# Patient Record
Sex: Female | Born: 1935 | Race: White | Hispanic: No | Smoking: Never smoker
Health system: Southern US, Community
[De-identification: ages and names within clinical notes are randomized; demographics above are authoritative.]

## PROBLEM LIST (undated history)

## (undated) DIAGNOSIS — B54 Unspecified malaria: Secondary | ICD-10-CM

## (undated) DIAGNOSIS — H9190 Unspecified hearing loss, unspecified ear: Secondary | ICD-10-CM

## (undated) DIAGNOSIS — C801 Malignant (primary) neoplasm, unspecified: Secondary | ICD-10-CM

## (undated) DIAGNOSIS — Z9181 History of falling: Secondary | ICD-10-CM

## (undated) DIAGNOSIS — C50919 Malignant neoplasm of unspecified site of unspecified female breast: Secondary | ICD-10-CM

## (undated) DIAGNOSIS — H547 Unspecified visual loss: Secondary | ICD-10-CM

## (undated) DIAGNOSIS — M810 Age-related osteoporosis without current pathological fracture: Secondary | ICD-10-CM

## (undated) DIAGNOSIS — H3530 Unspecified macular degeneration: Secondary | ICD-10-CM

## (undated) DIAGNOSIS — K219 Gastro-esophageal reflux disease without esophagitis: Secondary | ICD-10-CM

## (undated) DIAGNOSIS — H5316 Psychophysical visual disturbances: Secondary | ICD-10-CM

## (undated) HISTORY — DX: Unspecified visual loss: H54.7

## (undated) HISTORY — DX: History of falling: Z91.81

## (undated) HISTORY — PX: BREAST SURGERY: SHX581

## (undated) HISTORY — PX: BREAST LUMPECTOMY: SHX2

---
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
## 2005-02-11 ENCOUNTER — Ambulatory Visit: Payer: Self-pay | Admitting: Family Medicine

## 2010-12-16 ENCOUNTER — Ambulatory Visit: Payer: Self-pay | Admitting: Family Medicine

## 2010-12-21 ENCOUNTER — Ambulatory Visit: Payer: Self-pay | Admitting: Family Medicine

## 2011-06-28 ENCOUNTER — Ambulatory Visit: Payer: Self-pay | Admitting: Surgery

## 2011-12-26 ENCOUNTER — Ambulatory Visit: Payer: Self-pay | Admitting: Surgery

## 2012-01-06 ENCOUNTER — Ambulatory Visit: Payer: Self-pay | Admitting: Surgery

## 2012-01-12 ENCOUNTER — Ambulatory Visit: Payer: Self-pay | Admitting: Surgery

## 2012-01-23 LAB — PATHOLOGY REPORT

## 2012-02-02 ENCOUNTER — Ambulatory Visit: Payer: Self-pay | Admitting: Oncology

## 2012-02-22 ENCOUNTER — Ambulatory Visit: Payer: Self-pay | Admitting: Oncology

## 2014-08-21 ENCOUNTER — Emergency Department: Payer: Self-pay | Admitting: Emergency Medicine

## 2014-08-21 LAB — CBC WITH DIFFERENTIAL/PLATELET
Basophil #: 0.1 10*3/uL (ref 0.0–0.1)
Basophil %: 0.9 %
EOS ABS: 0.1 10*3/uL (ref 0.0–0.7)
Eosinophil %: 1.4 %
HCT: 32.3 % — AB (ref 35.0–47.0)
HGB: 10.9 g/dL — AB (ref 12.0–16.0)
Lymphocyte #: 1.5 10*3/uL (ref 1.0–3.6)
Lymphocyte %: 25.4 %
MCH: 31 pg (ref 26.0–34.0)
MCHC: 33.9 g/dL (ref 32.0–36.0)
MCV: 92 fL (ref 80–100)
Monocyte #: 0.5 x10 3/mm (ref 0.2–0.9)
Monocyte %: 8.2 %
Neutrophil #: 3.8 10*3/uL (ref 1.4–6.5)
Neutrophil %: 64.1 %
Platelet: 183 10*3/uL (ref 150–440)
RBC: 3.53 10*6/uL — ABNORMAL LOW (ref 3.80–5.20)
RDW: 12.8 % (ref 11.5–14.5)
WBC: 5.9 10*3/uL (ref 3.6–11.0)

## 2014-08-21 LAB — URINALYSIS, COMPLETE
BACTERIA: NONE SEEN
Bilirubin,UR: NEGATIVE
GLUCOSE, UR: NEGATIVE mg/dL (ref 0–75)
KETONE: NEGATIVE
NITRITE: NEGATIVE
Ph: 7 (ref 4.5–8.0)
Protein: NEGATIVE
RBC,UR: 1 /HPF (ref 0–5)
Specific Gravity: 1.006 (ref 1.003–1.030)
Squamous Epithelial: NONE SEEN

## 2014-08-21 LAB — COMPREHENSIVE METABOLIC PANEL
AST: 24 U/L (ref 15–37)
Albumin: 3.6 g/dL (ref 3.4–5.0)
Alkaline Phosphatase: 101 U/L
Anion Gap: 10 (ref 7–16)
BUN: 15 mg/dL (ref 7–18)
Bilirubin,Total: 0.4 mg/dL (ref 0.2–1.0)
Calcium, Total: 8.4 mg/dL — ABNORMAL LOW (ref 8.5–10.1)
Chloride: 97 mmol/L — ABNORMAL LOW (ref 98–107)
Co2: 25 mmol/L (ref 21–32)
Creatinine: 1.12 mg/dL (ref 0.60–1.30)
EGFR (African American): 59 — ABNORMAL LOW
GFR CALC NON AF AMER: 49 — AB
Glucose: 114 mg/dL — ABNORMAL HIGH (ref 65–99)
Osmolality: 266 (ref 275–301)
POTASSIUM: 4.3 mmol/L (ref 3.5–5.1)
SGPT (ALT): 16 U/L
SODIUM: 132 mmol/L — AB (ref 136–145)
TOTAL PROTEIN: 7.2 g/dL (ref 6.4–8.2)

## 2014-08-21 LAB — TROPONIN I

## 2014-12-04 DIAGNOSIS — R2681 Unsteadiness on feet: Secondary | ICD-10-CM | POA: Insufficient documentation

## 2014-12-04 DIAGNOSIS — R270 Ataxia, unspecified: Secondary | ICD-10-CM | POA: Insufficient documentation

## 2015-02-02 ENCOUNTER — Ambulatory Visit
Admit: 2015-02-02 | Disposition: A | Discharge status: Home / Self Care | Payer: Self-pay | Attending: Oncology | Admitting: Oncology

## 2015-02-10 LAB — CBC CANCER CENTER
BASOS PCT: 1.4 %
Basophil #: 0.1 x10 3/mm (ref 0.0–0.1)
EOS PCT: 6.4 %
Eosinophil #: 0.3 x10 3/mm (ref 0.0–0.7)
HCT: 36.7 % (ref 35.0–47.0)
HGB: 12.5 g/dL (ref 12.0–16.0)
LYMPHS ABS: 1.1 x10 3/mm (ref 1.0–3.6)
Lymphocyte %: 21.9 %
MCH: 30.4 pg (ref 26.0–34.0)
MCHC: 34 g/dL (ref 32.0–36.0)
MCV: 90 fL (ref 80–100)
Monocyte #: 0.3 x10 3/mm (ref 0.2–0.9)
Monocyte %: 6.4 %
Neutrophil #: 3.2 x10 3/mm (ref 1.4–6.5)
Neutrophil %: 63.9 %
Platelet: 195 x10 3/mm (ref 150–440)
RBC: 4.1 10*6/uL (ref 3.80–5.20)
RDW: 12.6 % (ref 11.5–14.5)
WBC: 5 x10 3/mm (ref 3.6–11.0)

## 2015-02-10 LAB — COMPREHENSIVE METABOLIC PANEL
ALBUMIN: 4.1 g/dL
ALK PHOS: 97 U/L
AST: 23 U/L
Anion Gap: 5 — ABNORMAL LOW (ref 7–16)
BUN: 13 mg/dL
Bilirubin,Total: 0.5 mg/dL
CREATININE: 0.99 mg/dL
Calcium, Total: 8.7 mg/dL — ABNORMAL LOW
Chloride: 103 mmol/L
Co2: 28 mmol/L
EGFR (Non-African Amer.): 51 — ABNORMAL LOW
GFR CALC AF AMER: 59 — AB
GLUCOSE: 101 mg/dL — AB
POTASSIUM: 4.5 mmol/L
SGPT (ALT): 12 U/L — ABNORMAL LOW
Sodium: 136 mmol/L
TOTAL PROTEIN: 7.1 g/dL

## 2015-02-15 NOTE — Op Note (Signed)
PATIENT NAME:  [name], [name] MR#:  [id number] DATE OF BIRTH:  1924/03/10  DATE OF PROCEDURE:  01/12/2012  PREOPERATIVE DIAGNOSIS: Right breast mass.   POSTOPERATIVE DIAGNOSIS: Right breast mass.   PROCEDURE: Excision of right breast mass.   SURGEON: Rochel Brome, MD.   ANESTHESIA: General.   INDICATION: This 81 year old female has a spiculated density in the upper aspect of the right breast which has increased in spiculation during observation and excision was recommended for further evaluation and treatment.   DESCRIPTION OF PROCEDURE: The patient was placed on the operating table in the supine position under general anesthesia. The dressing was removed from the upper aspect of the right breast exposing the Kopans wire which entered the breast at approximately the 12 o'clock position and proceeded inferiorly and posteriorly. The wire was cut 3 cm from the skin. The breast was prepared with ChloraPrep and draped in a sterile manner. Mammogram images were reviewed with the Kopans wire. Next, transversely oriented curvilinear incision was made from approximately 11:00 to the 1:00 position of the right breast several centimeters above the areola, carried down through subcutaneous tissues down as deep as the wire. Next, a sample of tissue surrounding the wire and particularly posterior or deep to the wire was removed. There was some minimal degree of nodularity appreciated within the tissue and was submitted for specimen mammogram and routine pathology. The wound was inspected. Several small bleeding points were cauterized. Hemostasis was subsequently intact. Subcutaneous tissues were approximated with 4-0 chromic and then the skin was closed with running 4-0 chromic subcuticular suture and Dermabond. The patient tolerated the procedure satisfactorily and was prepared for transfer to the recovery room. ____________________________ Lenna Sciara. Rochel Brome, MD jws:ap D: 01/12/2012 11:46:41 ET               T: 01/12/2012 12:02:30 ET               JOB#: [id number] cc: Loreli Dollar, MD, <Dictator> Loreli Dollar MD ELECTRONICALLY SIGNED 01/13/2012 19:02

## 2015-03-16 ENCOUNTER — Other Ambulatory Visit: Payer: Self-pay | Admitting: Oncology

## 2015-10-25 DIAGNOSIS — H5316 Psychophysical visual disturbances: Secondary | ICD-10-CM

## 2015-10-25 HISTORY — DX: Psychophysical visual disturbances: H53.16

## 2015-12-01 ENCOUNTER — Emergency Department
Admission: EM | Admit: 2015-12-01 | Discharge: 2015-12-01 | Disposition: A | Discharge status: Home / Self Care | Payer: Medicare Other | Attending: Emergency Medicine | Admitting: Emergency Medicine

## 2015-12-01 ENCOUNTER — Emergency Department (HOSPITAL_COMMUNITY)
Admission: EM | Admit: 2015-12-01 | Discharge: 2015-12-02 | Disposition: A | Discharge status: Home / Self Care | Payer: Medicare Other | Attending: Emergency Medicine | Admitting: Emergency Medicine

## 2015-12-01 ENCOUNTER — Encounter (HOSPITAL_COMMUNITY): Payer: Self-pay | Admitting: *Deleted

## 2015-12-01 ENCOUNTER — Emergency Department (HOSPITAL_COMMUNITY): Payer: Medicare Other

## 2015-12-01 DIAGNOSIS — W1839XA Other fall on same level, initial encounter: Secondary | ICD-10-CM | POA: Diagnosis not present

## 2015-12-01 DIAGNOSIS — Y92410 Unspecified street and highway as the place of occurrence of the external cause: Secondary | ICD-10-CM | POA: Diagnosis not present

## 2015-12-01 DIAGNOSIS — Y92009 Unspecified place in unspecified non-institutional (private) residence as the place of occurrence of the external cause: Secondary | ICD-10-CM

## 2015-12-01 DIAGNOSIS — S3992XA Unspecified injury of lower back, initial encounter: Secondary | ICD-10-CM | POA: Diagnosis present

## 2015-12-01 DIAGNOSIS — Y998 Other external cause status: Secondary | ICD-10-CM | POA: Insufficient documentation

## 2015-12-01 DIAGNOSIS — Z859 Personal history of malignant neoplasm, unspecified: Secondary | ICD-10-CM | POA: Insufficient documentation

## 2015-12-01 DIAGNOSIS — Y9389 Activity, other specified: Secondary | ICD-10-CM | POA: Insufficient documentation

## 2015-12-01 DIAGNOSIS — H9190 Unspecified hearing loss, unspecified ear: Secondary | ICD-10-CM | POA: Insufficient documentation

## 2015-12-01 DIAGNOSIS — Z853 Personal history of malignant neoplasm of breast: Secondary | ICD-10-CM | POA: Insufficient documentation

## 2015-12-01 DIAGNOSIS — W19XXXA Unspecified fall, initial encounter: Secondary | ICD-10-CM

## 2015-12-01 DIAGNOSIS — Z8619 Personal history of other infectious and parasitic diseases: Secondary | ICD-10-CM | POA: Diagnosis not present

## 2015-12-01 DIAGNOSIS — Y9289 Other specified places as the place of occurrence of the external cause: Secondary | ICD-10-CM | POA: Insufficient documentation

## 2015-12-01 DIAGNOSIS — Z880 Allergy status to penicillin: Secondary | ICD-10-CM | POA: Diagnosis not present

## 2015-12-01 DIAGNOSIS — Z8739 Personal history of other diseases of the musculoskeletal system and connective tissue: Secondary | ICD-10-CM | POA: Diagnosis not present

## 2015-12-01 DIAGNOSIS — K219 Gastro-esophageal reflux disease without esophagitis: Secondary | ICD-10-CM | POA: Diagnosis not present

## 2015-12-01 DIAGNOSIS — M545 Low back pain, unspecified: Secondary | ICD-10-CM

## 2015-12-01 DIAGNOSIS — T1490 Injury, unspecified: Secondary | ICD-10-CM | POA: Diagnosis present

## 2015-12-01 DIAGNOSIS — Z79899 Other long term (current) drug therapy: Secondary | ICD-10-CM | POA: Insufficient documentation

## 2015-12-01 HISTORY — DX: Unspecified hearing loss, unspecified ear: H91.90

## 2015-12-01 HISTORY — DX: Malignant (primary) neoplasm, unspecified: C80.1

## 2015-12-01 HISTORY — DX: Gastro-esophageal reflux disease without esophagitis: K21.9

## 2015-12-01 HISTORY — DX: Age-related osteoporosis without current pathological fracture: M81.0

## 2015-12-01 HISTORY — DX: Unspecified malaria: B54

## 2015-12-01 HISTORY — DX: Unspecified macular degeneration: H35.30

## 2015-12-01 HISTORY — DX: Malignant neoplasm of unspecified site of unspecified female breast: C50.919

## 2015-12-01 NOTE — ED Notes (Addendum)
Pt daughter asked how long the wait would be since they were still waiting to be triaged. Informed we do not give wait times due to being unable to predict EMS and emergent patients coming into the ED. Pt daughter states [place] be going [place] where there would be seen faster. Encouraged to stay but she refused.

## 2015-12-01 NOTE — ED Notes (Addendum)
Pt states she fell onto her back Friday, pt now c/o left and right side lower back pain. Pt lost balance getting out of a car, no LOC. Pt denies neck pain

## 2015-12-02 MED ORDER — TRAMADOL HCL 50 MG PO TABS: ORAL_TABLET | ORAL | Status: DC

## 2015-12-02 NOTE — Discharge Instructions (Signed)
Use ice and heat for comfort. Take the tramadol for pain. Recheck if still painful in the next week. Recheck sooner if your pain is getting worse instead of better.

## 2015-12-02 NOTE — ED Provider Notes (Signed)
CSN: [id number]     Arrival date & time 12/01/15  2121 History  By signing my name below, I, Soijett Blue, attest that this documentation has been prepared under the direction and in the presence of [name], MD at [facility]. Electronically Signed: Soijett Blue, ED Scribe. 12/02/2015. 12:17 AM.   Chief Complaint  Patient presents with  . Fall  . Back Pain      The history is provided by the patient and a relative. No language interpreter was used.    Angel Brandt is a 82 y.o. female who presents to the Emergency Department complaining of a fall February 3. She has balance problems and [place] and patient wouldn't get out of the car so she was stiff. When she got out of the car she didn't wait for her daughter to help her and lost her balance.  Pt notes that she fell backwards onto her back while trying to get out of the car. Her daughter states she has been having a "catch" in her back when she changes positions since the fall. Pt complained of worsening lower back pain todayPt has been going to therapy to improve her gait which was recommended by her neurologist. Pt daughter stays with her during the week and then the relatives stay with her on the weekends. Pt has not has back problems in the past. Denies having an orthopedist. Pt is having associated symptoms of lower back pain. She notes that she has tried tylenol with no relief of her symptoms. She denies hitting her head, neck pain, HA, and any other symptoms.  Pt is hard of hearing and only has peripheral vision.   Pt PCP: Dr. Maryland Pink   Past Medical History  Diagnosis Date  . Macular degeneration   . Osteoporosis   . Cancer ([place])   . Breast cancer ([place])   . Malaria   . Hearing impaired   . GERD (gastroesophageal reflux disease)    Past Surgical History  Procedure Laterality Date  . Breast surgery    . Breast lumpectomy     History reviewed. No pertinent family history. Social History  Substance  Use Topics  . Smoking status: Never Smoker   . Smokeless tobacco: Current User    Types: Snuff  . Alcohol Use: No   Lives at home Daughter stays with patient. Won't use her walker or get hearing aides.  OB History    No data available     Review of Systems  Musculoskeletal: Positive for back pain. Negative for neck pain.  Neurological: Negative for syncope and headaches.  All other systems reviewed and are negative.     Allergies  Codeine; Penicillins; Aspirin; and Sulfa antibiotics  Home Medications   Prior to Admission medications   Medication Sig Start Date End Date Taking? Authorizing Provider  acetaminophen (TYLENOL) 500 MG tablet Take 1,000 mg by mouth every 6 (six) hours as needed for mild pain.   Yes Historical Provider, MD  letrozole (FEMARA) 2.5 MG tablet TAKE 1 TABLET BY MOUTH DAILY 03/16/15  Yes Forest Gleason, MD  mirtazapine (REMERON) 45 MG tablet Take 45 mg by mouth at bedtime.   Yes Historical Provider, MD  omeprazole (PRILOSEC) 20 MG capsule Take 20 mg by mouth See admin instructions. Take one tablet every morning and a second tablet as needed for acid reflux   Yes Historical Provider, MD   BP 176/61 mmHg  Pulse 78  Temp(Src) 98 F (36.7 C) (  Oral)  Resp 18  Ht 5\' 3"  (1.6 m)  Wt 141 lb 1 oz (63.986 kg)  BMI 24.99 kg/m2  SpO2 99%  Vital signs normal except hypertension  Physical Exam  Constitutional: She is oriented to person, place, and time. She appears well-developed and well-nourished.  Non-toxic appearance. She does not appear ill. No distress.  HENT:  Head: Normocephalic and atraumatic.  Right Ear: External ear normal.  Left Ear: External ear normal.  Nose: Nose normal. No mucosal edema or rhinorrhea.  Mouth/Throat: Oropharynx is clear and moist and mucous membranes are normal. No dental abscesses or uvula swelling.  Eyes: Conjunctivae and EOM are normal. Pupils are equal, round, and reactive to light.  Blind central vision, has trouble looking  at examiner.  Neck: Normal range of motion and full passive range of motion without pain. Neck supple.  Cardiovascular: Normal rate, regular rhythm and normal heart sounds.  Exam reveals no gallop and no friction rub.   No murmur heard. Pulmonary/Chest: Effort normal and breath sounds normal. No respiratory distress. She has no wheezes. She has no rhonchi. She has no rales. She exhibits no tenderness and no crepitus.  Abdominal: Soft. Normal appearance and bowel sounds are normal. She exhibits no distension. There is no tenderness. There is no rebound and no guarding.  Musculoskeletal: Normal range of motion. She exhibits no edema or tenderness.       Back:  Moves all extremities well. Pt states that her lower lumbar spine hurts, but there is no tenderness to palpation. Pt notes there is pain with movement. Area of pain noted.   Neurological: She is alert and oriented to person, place, and time. She has normal strength. No cranial nerve deficit.  Skin: Skin is warm, dry and intact. No rash noted. No erythema. No pallor.  Psychiatric: She has a normal mood and affect. Her speech is normal and behavior is normal. Her mood appears not anxious.  Nursing note and vitals reviewed.   ED Course  Procedures (including critical care time)   DIAGNOSTIC STUDIES: Oxygen Saturation is 99% on RA, nl by my interpretation.    COORDINATION OF CARE: 12:17 AM Discussed treatment plan with pt at bedside which includes lumbar spine xray and bilateral hips xray and pt agreed to plan.  We discussed pain medication, pt only has pain when she moves or changes positions. Will start on tramadol since they state acetaminophen isn't working.      Imaging Review Dg Lumbar Spine Complete  12/01/2015  CLINICAL DATA:  Patient fell out of doors 1 - 2 days ago.  Pain. EXAM: LUMBAR SPINE - COMPLETE 4+ VIEW COMPARISON:  None. FINDINGS: There is no evidence of lumbar spine fracture. Mild anterolisthesis L4-5 of 4 mm. No  compression fracture. Disc space narrowing L4-5 and L5-S1. Osteopenia. IMPRESSION: No compression fracture is evident. 4 mm anterolisthesis L4-5 is felt to be degenerative in nature. Electronically Signed   By: Staci Righter M.D.   On: 12/01/2015 22:30   Dg Hips Bilat With Pelvis 3-4 Views  12/01/2015  CLINICAL DATA:  Patient fell out of doors 1-2 days ago. Still having pains. EXAM: DG HIP (WITH OR WITHOUT PELVIS) 3-4V BILAT COMPARISON:  None. FINDINGS: There is no evidence of hip fracture or dislocation. There is no evidence of arthropathy or other focal bone abnormality. Advanced osteopenia. Moderate stool burden. IMPRESSION: Negative. Electronically Signed   By: Staci Righter M.D.   On: 12/01/2015 22:29   I have personally reviewed and evaluated  these images as part of my medical decision-making.    MDM   Final diagnoses:  Fall at home, initial encounter   New Prescriptions   TRAMADOL (ULTRAM) 50 MG TABLET    Take 1 or 2 po Q 6hrs for pain    Plan discharge  .ikl   I personally performed the services described in this documentation, which was scribed in my presence. The recorded information has been reviewed and considered.  [name], MD, Barbette Or, MD 12/02/15 [phone]

## 2016-02-10 ENCOUNTER — Inpatient Hospital Stay: Payer: Medicare Other | Attending: Family Medicine

## 2016-02-10 ENCOUNTER — Inpatient Hospital Stay (HOSPITAL_BASED_OUTPATIENT_CLINIC_OR_DEPARTMENT_OTHER): Payer: Medicare Other | Admitting: Family Medicine

## 2016-02-10 ENCOUNTER — Other Ambulatory Visit: Payer: Self-pay | Admitting: *Deleted

## 2016-02-10 VITALS — BP 139/57 | HR 71 | Temp 98.4°F | Wt 133.8 lb

## 2016-02-10 DIAGNOSIS — Z79899 Other long term (current) drug therapy: Secondary | ICD-10-CM | POA: Diagnosis not present

## 2016-02-10 DIAGNOSIS — Z79811 Long term (current) use of aromatase inhibitors: Secondary | ICD-10-CM | POA: Diagnosis not present

## 2016-02-10 DIAGNOSIS — K219 Gastro-esophageal reflux disease without esophagitis: Secondary | ICD-10-CM | POA: Insufficient documentation

## 2016-02-10 DIAGNOSIS — C50919 Malignant neoplasm of unspecified site of unspecified female breast: Secondary | ICD-10-CM

## 2016-02-10 DIAGNOSIS — Z170 Estrogen receptor positive status [ER+]: Secondary | ICD-10-CM | POA: Insufficient documentation

## 2016-02-10 DIAGNOSIS — H9190 Unspecified hearing loss, unspecified ear: Secondary | ICD-10-CM | POA: Insufficient documentation

## 2016-02-10 DIAGNOSIS — Z8613 Personal history of malaria: Secondary | ICD-10-CM | POA: Insufficient documentation

## 2016-02-10 DIAGNOSIS — H3530 Unspecified macular degeneration: Secondary | ICD-10-CM | POA: Insufficient documentation

## 2016-02-10 DIAGNOSIS — C50911 Malignant neoplasm of unspecified site of right female breast: Secondary | ICD-10-CM | POA: Insufficient documentation

## 2016-02-10 DIAGNOSIS — K5792 Diverticulitis of intestine, part unspecified, without perforation or abscess without bleeding: Secondary | ICD-10-CM | POA: Insufficient documentation

## 2016-02-10 LAB — CBC WITH DIFFERENTIAL/PLATELET
BASOS ABS: 0.1 10*3/uL (ref 0–0.1)
Basophils Relative: 1 %
Eosinophils Absolute: 0.1 10*3/uL (ref 0–0.7)
Eosinophils Relative: 2 %
HEMATOCRIT: 35.1 % (ref 35.0–47.0)
Hemoglobin: 12.3 g/dL (ref 12.0–16.0)
LYMPHS PCT: 25 %
Lymphs Abs: 1.2 10*3/uL (ref 1.0–3.6)
MCH: 30.9 pg (ref 26.0–34.0)
MCHC: 35.2 g/dL (ref 32.0–36.0)
MCV: 87.8 fL (ref 80.0–100.0)
MONO ABS: 0.3 10*3/uL (ref 0.2–0.9)
Monocytes Relative: 6 %
NEUTROS ABS: 3 10*3/uL (ref 1.4–6.5)
Neutrophils Relative %: 66 %
Platelets: 211 10*3/uL (ref 150–440)
RBC: 4 MIL/uL (ref 3.80–5.20)
RDW: 12.7 % (ref 11.5–14.5)
WBC: 4.6 10*3/uL (ref 3.6–11.0)

## 2016-02-10 LAB — COMPREHENSIVE METABOLIC PANEL
ALK PHOS: 100 U/L (ref 38–126)
ALT: 9 U/L — AB (ref 14–54)
AST: 22 U/L (ref 15–41)
Albumin: 4.3 g/dL (ref 3.5–5.0)
Anion gap: 4 — ABNORMAL LOW (ref 5–15)
BILIRUBIN TOTAL: 0.3 mg/dL (ref 0.3–1.2)
BUN: 14 mg/dL (ref 6–20)
CALCIUM: 9.1 mg/dL (ref 8.9–10.3)
CO2: 24 mmol/L (ref 22–32)
CREATININE: 1.05 mg/dL — AB (ref 0.44–1.00)
Chloride: 101 mmol/L (ref 101–111)
GFR calc Af Amer: 53 mL/min — ABNORMAL LOW (ref >60)
GFR, EST NON AFRICAN AMERICAN: 45 mL/min — AB (ref >60)
Glucose, Bld: 114 mg/dL — ABNORMAL HIGH (ref 65–99)
Potassium: 4.1 mmol/L (ref 3.5–5.1)
Sodium: 129 mmol/L — ABNORMAL LOW (ref 135–145)
TOTAL PROTEIN: 7.7 g/dL (ref 6.5–8.1)

## 2016-02-10 MED ORDER — LETROZOLE 2.5 MG PO TABS: 2.5000 mg | ORAL_TABLET | Freq: Every day | ORAL | Status: DC

## 2016-02-10 NOTE — Progress Notes (Signed)
Angel Brandt  [phone]  [phone]     Angel Brandt DOB: 1924/08/16  MR#: [id number]  MPN#:[id number]  Patient Care Team: Maryland Pink, MD as PCP - General (Family Medicine)  CHIEF COMPLAINT:  Chief Complaint  Patient presents with  . Breast Cancer    INTERVAL HISTORY:  Patient is here for further evaluation and treatment consideration regarding carcinoma of breast. She was  previously lost to follow up for 3 years. Reports having been taking letrozole , calcium, and vitamin D as directed. She denies any fever, chills, nausea, vomiting, diarrhea, cough, shortness of breath, or chest pain. Denies any new palpable masses or fullness in the breast. She adamantly refuses any further mammograms. Daughter is with her today and states that she would not do anything in regards to treatment if there was a recurrence or progression.   REVIEW OF SYSTEMS:   Review of Systems  Constitutional: Negative for fever, chills, weight loss, malaise/fatigue and diaphoresis.  HENT: Negative.   Eyes: Negative.   Respiratory: Negative for cough, hemoptysis, sputum production, shortness of breath and wheezing.   Cardiovascular: Negative for chest pain, palpitations, orthopnea, claudication, leg swelling and PND.  Gastrointestinal: Negative for heartburn, nausea, vomiting, abdominal pain, diarrhea, constipation, blood in stool and melena.  Genitourinary: Negative.   Musculoskeletal: Negative.   Skin: Negative.   Neurological: Negative for dizziness, tingling, focal weakness, seizures and weakness.  Endo/Heme/Allergies: Does not bruise/bleed easily.  Psychiatric/Behavioral: Negative for depression. The patient is not nervous/anxious and does not have insomnia.     As per HPI. Otherwise, a complete review of systems is negatve.  ONCOLOGY HISTORY: Oncology History   1. Carcinoma breast diagnoses on January 12, 2012. 0.5cm (T1A) lymph nodes were not assessed.  Estrogen  receptor positive.  Progesterone receptor positive.  HER-2 receptor negative.      Malignant neoplasm of breast ([place])   02/10/2016 Initial Diagnosis Malignant neoplasm of breast [facility])    PAST MEDICAL HISTORY: Past Medical History  Diagnosis Date  . Macular degeneration   . Osteoporosis   . Cancer ([place])   . Breast cancer ([place])   . Malaria   . Hearing impaired   . GERD (gastroesophageal reflux disease)     PAST SURGICAL HISTORY: Past Surgical History  Procedure Laterality Date  . Breast surgery    . Breast lumpectomy      FAMILY HISTORY No family history on file.  GYNECOLOGIC HISTORY:  No LMP recorded. Patient is postmenopausal.     ADVANCED DIRECTIVES:    HEALTH MAINTENANCE: Social History  Substance Use Topics  . Smoking status: Never Smoker   . Smokeless tobacco: Current User    Types: Snuff  . Alcohol Use: No     Colonoscopy:  PAP:  Bone density:  Mammogram: September 2012  Allergies  Allergen Reactions  . Codeine Anaphylaxis  . Penicillins Anaphylaxis and Nausea And Vomiting  . Aspirin Other (See Comments) and Nausea And Vomiting    unknown  . Codeine Sulfate Nausea And Vomiting  . Other Other (See Comments)  . Sulfa Antibiotics Other (See Comments)    unknown    Current Outpatient Prescriptions  Medication Sig Dispense Refill  . mirtazapine (REMERON) 45 MG tablet Take by mouth.    Marland Kitchen omeprazole (PRILOSEC) 20 MG capsule     . acetaminophen (TYLENOL) 500 MG tablet Take 1,000 mg by mouth every 6 (six) hours as needed for mild pain.    Marland Kitchen letrozole (FEMARA) 2.5 MG tablet Take  1 tablet (2.5 mg total) by mouth daily. 30 tablet 11  . traMADol (ULTRAM) 50 MG tablet Take 1 or 2 po Q 6hrs for pain 20 tablet 0   No current facility-administered medications for this visit.    OBJECTIVE: BP 139/57 mmHg  Pulse 71  Temp(Src) 98.4 F (36.9 C) (Oral)  Wt 133 lb 13.1 oz (60.7 kg)   Body mass index is 23.71 kg/(m^2).    ECOG FS:0 - Asymptomatic  General:  Well-developed, well-nourished, no acute distress. Eyes: Pink conjunctiva, anicteric sclera. HEENT: Normocephalic, moist mucous membranes, clear oropharnyx. Lungs: Clear to auscultation bilaterally. Heart: Regular rate and rhythm. No rubs, murmurs, or gallops. Abdomen: Soft, nontender, nondistended. No organomegaly noted, normoactive bowel sounds. Breast: Breast palpated in a circular manner in the sitting and supine positions.  No masses or fullness palpated.  Axilla palpated in both positions with no masses or fullness palpated.  Musculoskeletal: No edema, cyanosis, or clubbing. Neuro: Alert, answering all questions appropriately. Cranial nerves grossly intact. Skin: No rashes or petechiae noted. Psych:  Poor historian , flat affect Lymphatics: No cervical, clavicular, or axillary LAD.   LAB RESULTS:  Appointment on 02/10/2016  Component Date Value Ref Range Status  . WBC 02/10/2016 4.6  3.6 - 11.0 K/uL Final  . RBC 02/10/2016 4.00  3.80 - 5.20 MIL/uL Final  . Hemoglobin 02/10/2016 12.3  12.0 - 16.0 g/dL Final  . HCT 02/10/2016 35.1  35.0 - 47.0 % Final  . MCV 02/10/2016 87.8  80.0 - 100.0 fL Final  . MCH 02/10/2016 30.9  26.0 - 34.0 pg Final  . MCHC 02/10/2016 35.2  32.0 - 36.0 g/dL Final  . RDW 02/10/2016 12.7  11.5 - 14.5 % Final  . Platelets 02/10/2016 211  150 - 440 K/uL Final  . Neutrophils Relative % 02/10/2016 66   Final  . Neutro Abs 02/10/2016 3.0  1.4 - 6.5 K/uL Final  . Lymphocytes Relative 02/10/2016 25   Final  . Lymphs Abs 02/10/2016 1.2  1.0 - 3.6 K/uL Final  . Monocytes Relative 02/10/2016 6   Final  . Monocytes Absolute 02/10/2016 0.3  0.2 - 0.9 K/uL Final  . Eosinophils Relative 02/10/2016 2   Final  . Eosinophils Absolute 02/10/2016 0.1  0 - 0.7 K/uL Final  . Basophils Relative 02/10/2016 1   Final  . Basophils Absolute 02/10/2016 0.1  0 - 0.1 K/uL Final  . Sodium 02/10/2016 129* 135 - 145 mmol/L Final  . Potassium 02/10/2016 4.1  3.5 - 5.1 mmol/L Final  .  Chloride 02/10/2016 101  101 - 111 mmol/L Final  . CO2 02/10/2016 24  22 - 32 mmol/L Final  . Glucose, Bld 02/10/2016 114* 65 - 99 mg/dL Final  . BUN 02/10/2016 14  6 - 20 mg/dL Final  . Creatinine, Ser 02/10/2016 1.05* 0.44 - 1.00 mg/dL Final  . Calcium 02/10/2016 9.1  8.9 - 10.3 mg/dL Final  . Total Protein 02/10/2016 7.7  6.5 - 8.1 g/dL Final  . Albumin 02/10/2016 4.3  3.5 - 5.0 g/dL Final  . AST 02/10/2016 22  15 - 41 U/L Final  . ALT 02/10/2016 9* 14 - 54 U/L Final  . Alkaline Phosphatase 02/10/2016 100  38 - 126 U/L Final  . Total Bilirubin 02/10/2016 0.3  0.3 - 1.2 mg/dL Final  . GFR calc non Af Amer 02/10/2016 45* >60 mL/min Final  . GFR calc Af Amer 02/10/2016 53* >60 mL/min Final   Comment: (NOTE) The eGFR has been calculated  using the CKD EPI equation. This calculation has not been validated in all clinical situations. eGFR's persistently <60 mL/min signify possible Chronic Kidney Disease.   . Anion gap 02/10/2016 4* 5 - 15 Final    STUDIES: No results found.  ASSESSMENT:  Carcinoma of right breast, T1a Nx Mx.  PLAN:   1. Carcinoma of right breast. Stage IA disease. Clinically there is no evidence of recurrent disease. Patient refuses any further mammograms. Are still less certain Caesar Bookman number Val and anything #Myrtle Earl Gala is as 65 She has been taking letrozole and states she will continue taking it because she has had no side effect.  Will continue with letrozole. She requests to have follow up on a yearly basis.   Patient expressed understanding and was in agreement with this plan. She also understands that She can call clinic at any time with any questions, concerns, or complaints.   Dr. Oliva Bustard was available for consultation and review of plan of care for this patient.    Evlyn Kanner, NP   02/16/2016 9:52 AM

## 2017-02-09 ENCOUNTER — Other Ambulatory Visit: Payer: Self-pay | Admitting: *Deleted

## 2017-02-09 DIAGNOSIS — Z853 Personal history of malignant neoplasm of breast: Secondary | ICD-10-CM

## 2017-02-10 ENCOUNTER — Encounter: Payer: Self-pay | Admitting: Internal Medicine

## 2017-02-10 ENCOUNTER — Inpatient Hospital Stay: Payer: Medicare Other | Attending: Internal Medicine

## 2017-02-10 ENCOUNTER — Inpatient Hospital Stay (HOSPITAL_BASED_OUTPATIENT_CLINIC_OR_DEPARTMENT_OTHER): Payer: Medicare Other | Admitting: Internal Medicine

## 2017-02-10 DIAGNOSIS — Z880 Allergy status to penicillin: Secondary | ICD-10-CM | POA: Insufficient documentation

## 2017-02-10 DIAGNOSIS — Z170 Estrogen receptor positive status [ER+]: Secondary | ICD-10-CM | POA: Diagnosis not present

## 2017-02-10 DIAGNOSIS — K219 Gastro-esophageal reflux disease without esophagitis: Secondary | ICD-10-CM | POA: Insufficient documentation

## 2017-02-10 DIAGNOSIS — Z79899 Other long term (current) drug therapy: Secondary | ICD-10-CM

## 2017-02-10 DIAGNOSIS — Z885 Allergy status to narcotic agent status: Secondary | ICD-10-CM

## 2017-02-10 DIAGNOSIS — C50811 Malignant neoplasm of overlapping sites of right female breast: Secondary | ICD-10-CM | POA: Diagnosis not present

## 2017-02-10 DIAGNOSIS — F419 Anxiety disorder, unspecified: Secondary | ICD-10-CM | POA: Diagnosis not present

## 2017-02-10 DIAGNOSIS — H548 Legal blindness, as defined in USA: Secondary | ICD-10-CM

## 2017-02-10 DIAGNOSIS — H9190 Unspecified hearing loss, unspecified ear: Secondary | ICD-10-CM | POA: Diagnosis not present

## 2017-02-10 DIAGNOSIS — Z79811 Long term (current) use of aromatase inhibitors: Secondary | ICD-10-CM | POA: Diagnosis not present

## 2017-02-10 DIAGNOSIS — Z853 Personal history of malignant neoplasm of breast: Secondary | ICD-10-CM

## 2017-02-10 DIAGNOSIS — H3530 Unspecified macular degeneration: Secondary | ICD-10-CM | POA: Insufficient documentation

## 2017-02-10 LAB — CBC WITH DIFFERENTIAL/PLATELET
BASOS ABS: 0.1 10*3/uL (ref 0–0.1)
Basophils Relative: 2 %
EOS PCT: 4 %
Eosinophils Absolute: 0.2 10*3/uL (ref 0–0.7)
HCT: 34.9 % — ABNORMAL LOW (ref 35.0–47.0)
HEMOGLOBIN: 12.2 g/dL (ref 12.0–16.0)
LYMPHS ABS: 1.1 10*3/uL (ref 1.0–3.6)
LYMPHS PCT: 21 %
MCH: 31 pg (ref 26.0–34.0)
MCHC: 34.9 g/dL (ref 32.0–36.0)
MCV: 88.7 fL (ref 80.0–100.0)
MONO ABS: 0.4 10*3/uL (ref 0.2–0.9)
Monocytes Relative: 8 %
NEUTROS ABS: 3.3 10*3/uL (ref 1.4–6.5)
Neutrophils Relative %: 65 %
PLATELETS: 224 10*3/uL (ref 150–440)
RBC: 3.94 MIL/uL (ref 3.80–5.20)
RDW: 12.8 % (ref 11.5–14.5)
WBC: 5.1 10*3/uL (ref 3.6–11.0)

## 2017-02-10 LAB — COMPREHENSIVE METABOLIC PANEL
ALK PHOS: 92 U/L (ref 38–126)
ALT: 10 U/L — AB (ref 14–54)
AST: 22 U/L (ref 15–41)
Albumin: 4.1 g/dL (ref 3.5–5.0)
Anion gap: 7 (ref 5–15)
BUN: 12 mg/dL (ref 6–20)
CALCIUM: 9.3 mg/dL (ref 8.9–10.3)
CHLORIDE: 101 mmol/L (ref 101–111)
CO2: 24 mmol/L (ref 22–32)
CREATININE: 0.99 mg/dL (ref 0.44–1.00)
GFR calc Af Amer: 56 mL/min — ABNORMAL LOW (ref >60)
GFR calc non Af Amer: 48 mL/min — ABNORMAL LOW (ref >60)
Glucose, Bld: 105 mg/dL — ABNORMAL HIGH (ref 65–99)
Potassium: 4 mmol/L (ref 3.5–5.1)
SODIUM: 132 mmol/L — AB (ref 135–145)
Total Bilirubin: 0.4 mg/dL (ref 0.3–1.2)
Total Protein: 7.3 g/dL (ref 6.5–8.1)

## 2017-02-10 NOTE — Progress Notes (Signed)
[place] OFFICE PROGRESS NOTE  Patient Care Team: [place] Pink, MD as PCP - General (Family Medicine)  Cancer Staging No matching staging information was found for the patient.    Oncology History   1. Carcinoma breast diagnoses on January 12, 2012. 0.5cm (T1A) lymph nodes were not assessed.  Estrogen receptor positive.  Progesterone receptor positive.  HER-2 receptor negative.       This is my first interaction with the patient as patient's primary oncologist has been Dr.Choksi. I reviewed the patient's prior charts/pertinent labs/imaging in detail; findings are summarized above.     INTERVAL HISTORY:  Angel Brandt 83 y.o.  female pleasant Patient who is legally blind and hard of hearing  above history of Stage I ER/PR positive breast cancer currently on Femara is here for follow-up. Patient is accompanied by her daughter.  Patient does not have any new lumps or bumps. Appetite is good. Patient has not had mammograms in the last few years because of age. Patient is independent at home. Daughter lives close by. Denies any new bone pain. Her shortness of breath or cough.   REVIEW OF SYSTEMS:  A complete 10 point review of system is done which is negative except mentioned above/history of present illness.   PAST MEDICAL HISTORY :  Past Medical History:  Diagnosis Date  . At high risk for falls   . Blind   . Breast cancer ([place])   . Cancer ([place])   . GERD (gastroesophageal reflux disease)   . Hearing impaired   . Macular degeneration   . Malaria   . Osteoporosis     PAST SURGICAL HISTORY :   Past Surgical History:  Procedure Laterality Date  . BREAST LUMPECTOMY    . BREAST SURGERY      FAMILY HISTORY :  No family history on file.  SOCIAL HISTORY:   Social History  Substance Use Topics  . Smoking status: Never Smoker  . Smokeless tobacco: Current User    Types: Snuff  . Alcohol use No    ALLERGIES:  is allergic to codeine; penicillins; aspirin;  codeine sulfate; other; and sulfa antibiotics.  MEDICATIONS:  Current Outpatient Prescriptions  Medication Sig Dispense Refill  . letrozole (FEMARA) 2.5 MG tablet Take 1 tablet (2.5 mg total) by mouth daily. 30 tablet 11  . mirtazapine (REMERON) 45 MG tablet Take 45 mg by mouth at bedtime.     Marland Kitchen omeprazole (PRILOSEC) 20 MG capsule Take 20 mg by mouth 2 (two) times daily before a meal.     . traMADol (ULTRAM) 50 MG tablet Take 1 or 2 po Q 6hrs for pain 20 tablet 0  . acetaminophen (TYLENOL) 500 MG tablet Take 1,000 mg by mouth every 6 (six) hours as needed for mild pain.     No current facility-administered medications for this visit.     PHYSICAL EXAMINATION: ECOG PERFORMANCE STATUS: 0 - Asymptomatic  BP 121/66 (Patient Position: Sitting)   Pulse 67   Temp 97.6 F (36.4 C) (Tympanic)   Resp 18   Ht '5\' 3"'$  (1.6 m)   Wt 141 lb 3.2 oz (64 kg)   BMI 25.01 kg/m   Filed Weights   02/10/17 1124  Weight: 141 lb 3.2 oz (64 kg)    GENERAL: Well-nourished well-developed; Alert, no distress and comfortable.   Accompanied by daughter.  EYES: no pallor or icterus OROPHARYNX: no thrush or ulceration; good dentition  NECK: supple, no masses felt LYMPH:  no palpable lymphadenopathy  in the cervical, axillary or inguinal regions LUNGS: clear to auscultation and  No wheeze or crackles HEART/CVS: regular rate & rhythm and no murmurs; No lower extremity edema ABDOMEN:abdomen soft, non-tender and normal bowel sounds Musculoskeletal:no cyanosis of digits and no clubbing  PSYCH: alert & oriented x 3 with fluent speech NEURO: no focal motor/sensory deficits SKIN:  no rashes or significant lesions Right and left BREAST exam [in the presence of nurse]- no unusual skin changes or dominant masses felt. Surgical scars noted.    LABORATORY DATA:  I have reviewed the data as listed    Component Value Date/Time   NA 132 (L) 02/10/2017 1103   NA 136 02/10/2015 1103   K 4.0 02/10/2017 1103   K 4.5  02/10/2015 1103   CL 101 02/10/2017 1103   CL 103 02/10/2015 1103   CO2 24 02/10/2017 1103   CO2 28 02/10/2015 1103   GLUCOSE 105 (H) 02/10/2017 1103   GLUCOSE 101 (H) 02/10/2015 1103   BUN 12 02/10/2017 1103   BUN 13 02/10/2015 1103   CREATININE 0.99 02/10/2017 1103   CREATININE 0.99 02/10/2015 1103   CALCIUM 9.3 02/10/2017 1103   CALCIUM 8.7 (L) 02/10/2015 1103   PROT 7.3 02/10/2017 1103   PROT 7.1 02/10/2015 1103   ALBUMIN 4.1 02/10/2017 1103   ALBUMIN 4.1 02/10/2015 1103   AST 22 02/10/2017 1103   AST 23 02/10/2015 1103   ALT 10 (L) 02/10/2017 1103   ALT 12 (L) 02/10/2015 1103   ALKPHOS 92 02/10/2017 1103   ALKPHOS 97 02/10/2015 1103   BILITOT 0.4 02/10/2017 1103   BILITOT 0.5 02/10/2015 1103   GFRNONAA 48 (L) 02/10/2017 1103   GFRNONAA 51 (L) 02/10/2015 1103   GFRAA 56 (L) 02/10/2017 1103   GFRAA 59 (L) 02/10/2015 1103    No results found for: SPEP, UPEP  Lab Results  Component Value Date   WBC 5.1 02/10/2017   NEUTROABS 3.3 02/10/2017   HGB 12.2 02/10/2017   HCT 34.9 (L) 02/10/2017   MCV 88.7 02/10/2017   PLT 224 02/10/2017      Chemistry      Component Value Date/Time   NA 132 (L) 02/10/2017 1103   NA 136 02/10/2015 1103   K 4.0 02/10/2017 1103   K 4.5 02/10/2015 1103   CL 101 02/10/2017 1103   CL 103 02/10/2015 1103   CO2 24 02/10/2017 1103   CO2 28 02/10/2015 1103   BUN 12 02/10/2017 1103   BUN 13 02/10/2015 1103   CREATININE 0.99 02/10/2017 1103   CREATININE 0.99 02/10/2015 1103      Component Value Date/Time   CALCIUM 9.3 02/10/2017 1103   CALCIUM 8.7 (L) 02/10/2015 1103   ALKPHOS 92 02/10/2017 1103   ALKPHOS 97 02/10/2015 1103   AST 22 02/10/2017 1103   AST 23 02/10/2015 1103   ALT 10 (L) 02/10/2017 1103   ALT 12 (L) 02/10/2015 1103   BILITOT 0.4 02/10/2017 1103   BILITOT 0.5 02/10/2015 1103       RADIOGRAPHIC STUDIES: I have personally reviewed the radiological images as listed and agreed with the findings in the report. No  results found.   ASSESSMENT & PLAN:  Carcinoma of overlapping sites of right breast in female, estrogen receptor positive ([place]) # right breast ca-stage I ER/PR positive diagnosed in 2013. Clinically no evidence of recurrence. Currently on Femara tolerating well without major side effects. Patient I think at this time can come off Femara. Had long discussions regarding screening  with mammograms. Patient extremely anxious/legally blind/hard of hearing/ and also given her age- given patient's/family preference it was decided not to continue mammograms.   # legally blind/ HOH.   # follow up as needed   CC: Dr.Hedrick.    No orders of the defined types were placed in this encounter.  All questions were answered. The patient knows to call the clinic with any problems, questions or concerns.      [name], MD 02/12/2017 6:54 PM

## 2017-02-10 NOTE — Assessment & Plan Note (Addendum)
#   right breast ca-stage I ER/PR positive diagnosed in 2013. Clinically no evidence of recurrence. Currently on Femara tolerating well without major side effects. Patient I think at this time can come off Femara. Had long discussions regarding screening with mammograms. Patient extremely anxious/legally blind/hard of hearing/ and also given her age- given patient's/family preference it was decided not to continue mammograms.   # legally blind/ HOH.   # follow up as needed   CC: Dr.Hedrick.

## 2017-02-10 NOTE — Progress Notes (Signed)
Patient here for breast cancer f/u. She has finished 5 years of Femara. Daughter would like to know plan on continuing the femara vs stopping the treatment.

## 2017-04-14 ENCOUNTER — Other Ambulatory Visit: Payer: Self-pay | Admitting: *Deleted

## 2017-04-14 DIAGNOSIS — C50811 Malignant neoplasm of overlapping sites of right female breast: Secondary | ICD-10-CM

## 2017-04-14 DIAGNOSIS — Z170 Estrogen receptor positive status [ER+]: Principal | ICD-10-CM

## 2017-04-14 MED ORDER — LETROZOLE 2.5 MG PO TABS: 2.5000 mg | ORAL_TABLET | Freq: Every day | ORAL | 11 refills | Status: DC

## 2017-10-17 ENCOUNTER — Emergency Department: Payer: Medicare Other

## 2017-10-17 ENCOUNTER — Inpatient Hospital Stay
Admission: EM | Admit: 2017-10-17 | Discharge: 2017-10-19 | DRG: 069 | Disposition: A | Discharge status: Home Health | Payer: Medicare Other | Attending: Internal Medicine | Admitting: Internal Medicine

## 2017-10-17 DIAGNOSIS — Z853 Personal history of malignant neoplasm of breast: Secondary | ICD-10-CM

## 2017-10-17 DIAGNOSIS — E871 Hypo-osmolality and hyponatremia: Secondary | ICD-10-CM | POA: Diagnosis not present

## 2017-10-17 DIAGNOSIS — I10 Essential (primary) hypertension: Secondary | ICD-10-CM | POA: Diagnosis present

## 2017-10-17 DIAGNOSIS — F0391 Unspecified dementia with behavioral disturbance: Secondary | ICD-10-CM | POA: Diagnosis present

## 2017-10-17 DIAGNOSIS — Z882 Allergy status to sulfonamides status: Secondary | ICD-10-CM

## 2017-10-17 DIAGNOSIS — G459 Transient cerebral ischemic attack, unspecified: Secondary | ICD-10-CM | POA: Diagnosis not present

## 2017-10-17 DIAGNOSIS — R112 Nausea with vomiting, unspecified: Secondary | ICD-10-CM | POA: Diagnosis present

## 2017-10-17 DIAGNOSIS — Z8249 Family history of ischemic heart disease and other diseases of the circulatory system: Secondary | ICD-10-CM | POA: Diagnosis not present

## 2017-10-17 DIAGNOSIS — Z886 Allergy status to analgesic agent status: Secondary | ICD-10-CM | POA: Diagnosis not present

## 2017-10-17 DIAGNOSIS — H3530 Unspecified macular degeneration: Secondary | ICD-10-CM | POA: Diagnosis not present

## 2017-10-17 DIAGNOSIS — R4781 Slurred speech: Secondary | ICD-10-CM | POA: Diagnosis not present

## 2017-10-17 DIAGNOSIS — H9190 Unspecified hearing loss, unspecified ear: Secondary | ICD-10-CM | POA: Diagnosis present

## 2017-10-17 DIAGNOSIS — R2681 Unsteadiness on feet: Secondary | ICD-10-CM | POA: Diagnosis present

## 2017-10-17 DIAGNOSIS — F17290 Nicotine dependence, other tobacco product, uncomplicated: Secondary | ICD-10-CM | POA: Diagnosis present

## 2017-10-17 DIAGNOSIS — H547 Unspecified visual loss: Secondary | ICD-10-CM | POA: Diagnosis not present

## 2017-10-17 DIAGNOSIS — Z888 Allergy status to other drugs, medicaments and biological substances status: Secondary | ICD-10-CM

## 2017-10-17 DIAGNOSIS — Z885 Allergy status to narcotic agent status: Secondary | ICD-10-CM | POA: Diagnosis not present

## 2017-10-17 DIAGNOSIS — K219 Gastro-esophageal reflux disease without esophagitis: Secondary | ICD-10-CM | POA: Diagnosis not present

## 2017-10-17 DIAGNOSIS — M810 Age-related osteoporosis without current pathological fracture: Secondary | ICD-10-CM | POA: Diagnosis not present

## 2017-10-17 DIAGNOSIS — Z880 Allergy status to penicillin: Secondary | ICD-10-CM

## 2017-10-17 DIAGNOSIS — M461 Sacroiliitis, not elsewhere classified: Secondary | ICD-10-CM | POA: Diagnosis present

## 2017-10-17 DIAGNOSIS — R079 Chest pain, unspecified: Secondary | ICD-10-CM

## 2017-10-17 DIAGNOSIS — I639 Cerebral infarction, unspecified: Secondary | ICD-10-CM

## 2017-10-17 DIAGNOSIS — R29701 NIHSS score 1: Secondary | ICD-10-CM | POA: Diagnosis present

## 2017-10-17 DIAGNOSIS — Z79899 Other long term (current) drug therapy: Secondary | ICD-10-CM

## 2017-10-17 LAB — URINALYSIS, COMPLETE (UACMP) WITH MICROSCOPIC
BACTERIA UA: NONE SEEN
Bilirubin Urine: NEGATIVE
GLUCOSE, UA: NEGATIVE mg/dL
Ketones, ur: NEGATIVE mg/dL
LEUKOCYTES UA: NEGATIVE
NITRITE: NEGATIVE
PH: 5 (ref 5.0–8.0)
PROTEIN: NEGATIVE mg/dL
Specific Gravity, Urine: 1.014 (ref 1.005–1.030)
Squamous Epithelial / LPF: NONE SEEN

## 2017-10-17 LAB — CBC WITH DIFFERENTIAL/PLATELET
BASOS ABS: 0.1 10*3/uL (ref 0–0.1)
BASOS PCT: 1 %
EOS ABS: 0.1 10*3/uL (ref 0–0.7)
EOS PCT: 1 %
HCT: 31.7 % — ABNORMAL LOW (ref 35.0–47.0)
Hemoglobin: 11.3 g/dL — ABNORMAL LOW (ref 12.0–16.0)
Lymphocytes Relative: 7 %
Lymphs Abs: 0.7 10*3/uL — ABNORMAL LOW (ref 1.0–3.6)
MCH: 31.8 pg (ref 26.0–34.0)
MCHC: 35.6 g/dL (ref 32.0–36.0)
MCV: 89.3 fL (ref 80.0–100.0)
MONO ABS: 0.6 10*3/uL (ref 0.2–0.9)
Monocytes Relative: 7 %
NEUTROS ABS: 7.9 10*3/uL — AB (ref 1.4–6.5)
Neutrophils Relative %: 84 %
PLATELETS: 217 10*3/uL (ref 150–440)
RBC: 3.55 MIL/uL — ABNORMAL LOW (ref 3.80–5.20)
RDW: 13 % (ref 11.5–14.5)
WBC: 9.3 10*3/uL (ref 3.6–11.0)

## 2017-10-17 LAB — COMPREHENSIVE METABOLIC PANEL
ALBUMIN: 4.2 g/dL (ref 3.5–5.0)
ALT: 11 U/L — ABNORMAL LOW (ref 14–54)
ANION GAP: 7 (ref 5–15)
AST: 27 U/L (ref 15–41)
Alkaline Phosphatase: 91 U/L (ref 38–126)
BUN: 16 mg/dL (ref 6–20)
CHLORIDE: 94 mmol/L — AB (ref 101–111)
CO2: 26 mmol/L (ref 22–32)
Calcium: 9.3 mg/dL (ref 8.9–10.3)
Creatinine, Ser: 0.76 mg/dL (ref 0.44–1.00)
GFR calc Af Amer: >60 mL/min (ref >60)
GFR calc non Af Amer: >60 mL/min (ref >60)
GLUCOSE: 126 mg/dL — AB (ref 65–99)
POTASSIUM: 4.1 mmol/L (ref 3.5–5.1)
SODIUM: 127 mmol/L — AB (ref 135–145)
Total Bilirubin: 0.4 mg/dL (ref 0.3–1.2)
Total Protein: 7.4 g/dL (ref 6.5–8.1)

## 2017-10-17 LAB — LIPASE, BLOOD: LIPASE: 40 U/L (ref 11–51)

## 2017-10-17 LAB — TROPONIN I: Troponin I: <0.03 ng/mL (ref <0.03)

## 2017-10-17 MED ORDER — SODIUM CHLORIDE 0.9 % IV BOLUS (SEPSIS)
1000.0000 mL | Freq: Once | INTRAVENOUS | Status: AC
  Administered 2017-10-17: 1000 mL via INTRAVENOUS

## 2017-10-17 MED ORDER — ONDANSETRON HCL 4 MG/2ML IJ SOLN
4.0000 mg | Freq: Once | INTRAMUSCULAR | Status: AC
  Administered 2017-10-17: 4 mg via INTRAVENOUS
  Filled 2017-10-17: qty 2

## 2017-10-17 NOTE — ED Triage Notes (Addendum)
Pt arrived from home via EMS with complaints from daughter of vomiting about 41min ago (10:50pm). Daughter states that around 8:30pm she grabbed her head and speech became gargled.Pt report no pain. Daughter reports pt has balance issues and dizziness. Pt has Hx of dementia, blindness, GERD, and is hard of hearing (right ear is best). Pt has allergy to ASA. VS per EMS BP-150/60 HR-66. Pt alert to name and place. Daughter at bedside.

## 2017-10-17 NOTE — ED Provider Notes (Signed)
[facility] Emergency Department Provider Note   ____________________________________________   First MD Initiated Contact with Patient 10/17/17 2303     (approximate)  I have reviewed the triage vital signs and the nursing notes.   HISTORY  Chief Complaint Emesis  Level 5 caveat: Patient with dementia History obtained per patient's daughters  HPI Angel Brandt is a 83 y.o. female brought to the ED from home via EMS with multiple medical complaints.  Patient has a history of dementia, macular degeneration, GERD whose daughter states she had one episode of vomiting approximately 30 minutes prior to arrival.  Daughter states around 8:30 PM patient describes her head, her speech was garbled for a few minutes and patient complained of chest pain at that time.  Told her daughter she felt dizzy and off balance.  Daughter denies facial droop, unilateral weakness, unsteady gait.  Patient currently denies any complaints.  Specifically, denies fever, chills, chest pain, shortness of breath, abdominal pain, nausea, vomiting, dysuria, diarrhea.  Denies recent travel or trauma.  Nothing makes her symptoms better or worse.   Past Medical History:  Diagnosis Date  . At high risk for falls   . Blind   . Breast cancer ([place])   . Cancer ([place])   . GERD (gastroesophageal reflux disease)   . Hearing impaired   . Macular degeneration   . Malaria   . Osteoporosis     Patient Active Problem List   Diagnosis Date Noted  . Carcinoma of overlapping sites of right breast in female, estrogen receptor positive ([place]) 02/10/2017  . Diverticulitis 02/10/2016  . H/O malaria 02/10/2016  . Degeneration macular 02/10/2016  . 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 Gait instability 12/04/2014    Past Surgical History:  Procedure Laterality Date  . BREAST LUMPECTOMY    . BREAST SURGERY      Prior to Admission medications   Medication Sig Start Date End Date Taking? Authorizing  Provider  acetaminophen (TYLENOL) 500 MG tablet Take 1,000 mg by mouth every 6 (six) hours as needed for mild pain.    [provider]  letrozole (FEMARA) 2.5 MG tablet Take 1 tablet (2.5 mg total) by mouth daily. 04/14/17   [name], MD  mirtazapine (REMERON) 45 MG tablet Take 45 mg by mouth at bedtime.  05/27/15   [provider]  omeprazole (PRILOSEC) 20 MG capsule Take 20 mg by mouth 2 (two) times daily before a meal.  12/23/15   [provider]  traMADol (ULTRAM) 50 MG tablet Take 1 or 2 po Q 6hrs for pain 12/02/15   [name], MD    Allergies Codeine; Penicillins; Aspirin; Codeine sulfate; Other; and Sulfa antibiotics  History reviewed. No pertinent family history.  Social History Social History   Tobacco Use  . Smoking status: Never Smoker  . Smokeless tobacco: Current User    Types: Snuff  Substance Use Topics  . Alcohol use: No  . Drug use: No    Review of Systems  Constitutional: No fever/chills. Eyes: No visual changes. ENT: No sore throat. Cardiovascular: Positive for chest pain. Respiratory: Denies shortness of breath. Gastrointestinal: No abdominal pain.  Positive for nausea and vomiting.  No diarrhea.  No constipation. Genitourinary: Negative for dysuria. Musculoskeletal: Negative for back pain. Skin: Negative for rash. Neurological: Positive for transient garbled speech.  Negative for headaches, focal weakness or numbness.   ____________________________________________   PHYSICAL EXAM:  VITAL SIGNS: ED Triage Vitals  Enc Vitals Group  BP 10/17/17 2215 (!) 157/43     Pulse Rate 10/17/17 2215 70     Resp 10/17/17 2215 18     Temp 10/17/17 2215 (!) 97.4 F (36.3 C)     Temp Source 10/17/17 2215 Oral     SpO2 10/17/17 2215 100 %     Weight 10/17/17 2217 140 lb (63.5 kg)     Height 10/17/17 2217 5\' 5"  (1.651 m)     Head Circumference --      Peak Flow --      Pain Score --      Pain Loc --      Pain Edu? --        Excl. in [place]? --     Constitutional: Alert and oriented. Well appearing and in no acute distress. Eyes: Conjunctivae are normal. PERRL. EOMI. Arcus senilius. Head: Atraumatic. Nose: No congestion/rhinnorhea. Mouth/Throat: Mucous membranes are moist.  Oropharynx non-erythematous. Neck: No stridor.  No carotid bruits. Cardiovascular: Normal rate, regular rhythm. Grossly normal heart sounds.  Good peripheral circulation. Respiratory: Normal respiratory effort.  No retractions. Lungs CTAB. Gastrointestinal: Soft and nontender to light or deep palpation. No distention. No abdominal bruits. No CVA tenderness. Musculoskeletal: No lower extremity tenderness nor edema.  No joint effusions. Neurologic: Alert and oriented to person and place.  CN II-XII grossly intact.  Normal speech and language. No gross focal neurologic deficits are appreciated. MAEx4. Skin:  Skin is warm, dry and intact. No rash noted. Psychiatric: Mood and affect are normal. Speech and behavior are normal.  ____________________________________________   LABS (all labs ordered are listed, but only abnormal results are displayed)  Labs Reviewed  CBC WITH DIFFERENTIAL/PLATELET - Abnormal; Notable for the following components:      Result Value   RBC 3.55 (*)    Hemoglobin 11.3 (*)    HCT 31.7 (*)    Neutro Abs 7.9 (*)    Lymphs Abs 0.7 (*)    All other components within normal limits  COMPREHENSIVE METABOLIC PANEL - Abnormal; Notable for the following components:   Sodium 127 (*)    Chloride 94 (*)    Glucose, Bld 126 (*)    ALT 11 (*)    All other components within normal limits  URINALYSIS, COMPLETE (UACMP) WITH MICROSCOPIC - Abnormal; Notable for the following components:   Color, Urine YELLOW (*)    APPearance CLEAR (*)    Hgb urine dipstick SMALL (*)    All other components within normal limits  LIPASE, BLOOD  TROPONIN I   ____________________________________________  EKG  ED ECG REPORT I, Miyonna Ormiston  J, the attending physician, personally viewed and interpreted this ECG.   Date: 10/17/2017  EKG Time: 2341  Rate: 67  Rhythm: normal EKG, normal sinus rhythm  Axis: Normal normal  Intervals:none  ST&T Change: Nonspecific  ____________________________________________  RADIOLOGY  Ct Head Wo Contrast  Result Date: 10/18/2017 CLINICAL DATA:  Acute onset of vomiting. Garbled speech. Balance issues and dizziness. EXAM: CT HEAD WITHOUT CONTRAST TECHNIQUE: Contiguous axial images were obtained from the base of the skull through the vertex without intravenous contrast. COMPARISON:  CT of the head performed 08/21/2014 FINDINGS: Brain: No evidence of acute infarction, hemorrhage, hydrocephalus, extra-axial collection or mass lesion/mass effect. Prominence of the ventricles and sulci reflects moderate cortical volume loss. Mild cerebellar atrophy is noted. Scattered periventricular and subcortical white matter change likely reflects small vessel ischemic microangiopathy. The brainstem and fourth ventricle are within normal limits. The basal ganglia are unremarkable in  appearance. The cerebral hemispheres demonstrate grossly normal gray-white differentiation. No mass effect or midline shift is seen. Vascular: No hyperdense vessel or unexpected calcification. Skull: There is no evidence of fracture; visualized osseous structures are unremarkable in appearance. Sinuses/Orbits: The visualized portions of the orbits are within normal limits. There is partial opacification of the right mastoid air cells. The paranasal sinuses and left mastoid air cells are well-aerated. Other: No significant soft tissue abnormalities are seen. IMPRESSION: 1. No acute intracranial pathology seen on CT. 2. Moderate cortical volume loss and scattered small vessel ischemic microangiopathy. 3. Partial opacification of the right mastoid air cells. Electronically Signed   By: Garald Balding M.D.   On: 10/18/2017 00:09   Dg Chest Port 1  View  Result Date: 10/17/2017 CLINICAL DATA:  Nausea and vomiting EXAM: PORTABLE CHEST 1 VIEW COMPARISON:  None. FINDINGS: Minimal atelectasis at the left base. No focal consolidation. Normal heart size. Aortic atherosclerosis. No pneumothorax. IMPRESSION: Minimal atelectasis or scar at the left base. Negative for edema or infiltrate. Electronically Signed   By: Donavan Foil M.D.   On: 10/17/2017 23:43    ____________________________________________   PROCEDURES  Procedure(s) performed: None  Procedures  Critical Care performed: No  ____________________________________________   INITIAL IMPRESSION / ASSESSMENT AND PLAN / ED COURSE  As part of my medical decision making, I reviewed the following data within the [place] History obtained from family, Nursing notes reviewed and incorporated, Labs reviewed, EKG interpreted, Old chart reviewed, Radiograph reviewed, Discussed with PCP and Notes from prior ED visits.   83 year old female who presents with vomiting, dizziness, chest pain, transient garbled speech. Differential diagnosis includes, but is not limited to, CVA, TIA, ICH, encephalopathy, sepsis, UTI, ACS, aortic dissection, pulmonary embolism, cardiac tamponade, pneumothorax, pneumonia, pericarditis, myocarditis, GI-related causes including esophagitis/gastritis, and musculoskeletal chest wall pain.    Will obtain screening lab work, urinalysis, CT head.  Initiate IV fluid resuscitation and antiemetic.  Clinical Course as of Oct 18 57  Wed Oct 18, 2017  0057 Updated patient and daughters of laboratory and imaging results.  She is allergic to aspirin and it is thus held.  Discussed with hospitalist Dr. Manuella Ghazi who will evaluate patient in the emergency department for admission.  [JS]    Clinical Course User Index [JS] Paulette Blanch, MD     ____________________________________________   FINAL CLINICAL IMPRESSION(S) / ED DIAGNOSES  Final diagnoses:    Chest pain, unspecified type  TIA (transient ischemic attack)  Non-intractable vomiting with nausea, unspecified vomiting type  Hyponatremia     ED Discharge Orders    None       Note:  This document was prepared using Dragon voice recognition software and [place] include unintentional dictation errors.    Paulette Blanch, MD 10/18/17 [phone]

## 2017-10-18 ENCOUNTER — Encounter: Payer: Self-pay | Admitting: Internal Medicine

## 2017-10-18 ENCOUNTER — Inpatient Hospital Stay
Admit: 2017-10-18 | Discharge: 2017-10-18 | Disposition: A | Discharge status: Home / Self Care | Payer: Medicare Other | Attending: Internal Medicine | Admitting: Internal Medicine

## 2017-10-18 ENCOUNTER — Other Ambulatory Visit: Payer: Self-pay

## 2017-10-18 ENCOUNTER — Inpatient Hospital Stay: Payer: Medicare Other

## 2017-10-18 DIAGNOSIS — M461 Sacroiliitis, not elsewhere classified: Secondary | ICD-10-CM | POA: Diagnosis not present

## 2017-10-18 DIAGNOSIS — M810 Age-related osteoporosis without current pathological fracture: Secondary | ICD-10-CM | POA: Diagnosis not present

## 2017-10-18 DIAGNOSIS — G459 Transient cerebral ischemic attack, unspecified: Secondary | ICD-10-CM | POA: Diagnosis present

## 2017-10-18 DIAGNOSIS — K219 Gastro-esophageal reflux disease without esophagitis: Secondary | ICD-10-CM | POA: Diagnosis not present

## 2017-10-18 DIAGNOSIS — E871 Hypo-osmolality and hyponatremia: Secondary | ICD-10-CM | POA: Diagnosis not present

## 2017-10-18 DIAGNOSIS — R2681 Unsteadiness on feet: Secondary | ICD-10-CM | POA: Diagnosis not present

## 2017-10-18 DIAGNOSIS — H3530 Unspecified macular degeneration: Secondary | ICD-10-CM | POA: Diagnosis not present

## 2017-10-18 DIAGNOSIS — Z886 Allergy status to analgesic agent status: Secondary | ICD-10-CM | POA: Diagnosis not present

## 2017-10-18 DIAGNOSIS — H9190 Unspecified hearing loss, unspecified ear: Secondary | ICD-10-CM | POA: Diagnosis not present

## 2017-10-18 DIAGNOSIS — R4781 Slurred speech: Secondary | ICD-10-CM | POA: Diagnosis not present

## 2017-10-18 DIAGNOSIS — H547 Unspecified visual loss: Secondary | ICD-10-CM | POA: Diagnosis not present

## 2017-10-18 DIAGNOSIS — F0391 Unspecified dementia with behavioral disturbance: Secondary | ICD-10-CM | POA: Diagnosis not present

## 2017-10-18 DIAGNOSIS — Z885 Allergy status to narcotic agent status: Secondary | ICD-10-CM | POA: Diagnosis not present

## 2017-10-18 DIAGNOSIS — R29701 NIHSS score 1: Secondary | ICD-10-CM | POA: Diagnosis not present

## 2017-10-18 DIAGNOSIS — I10 Essential (primary) hypertension: Secondary | ICD-10-CM | POA: Diagnosis not present

## 2017-10-18 DIAGNOSIS — Z853 Personal history of malignant neoplasm of breast: Secondary | ICD-10-CM | POA: Diagnosis not present

## 2017-10-18 DIAGNOSIS — F17290 Nicotine dependence, other tobacco product, uncomplicated: Secondary | ICD-10-CM | POA: Diagnosis not present

## 2017-10-18 DIAGNOSIS — R112 Nausea with vomiting, unspecified: Secondary | ICD-10-CM | POA: Diagnosis present

## 2017-10-18 DIAGNOSIS — Z8249 Family history of ischemic heart disease and other diseases of the circulatory system: Secondary | ICD-10-CM | POA: Diagnosis not present

## 2017-10-18 LAB — ECHOCARDIOGRAM COMPLETE
HEIGHTINCHES: 65 in
Weight: 2240 oz

## 2017-10-18 LAB — BASIC METABOLIC PANEL
ANION GAP: 7 (ref 5–15)
BUN: 14 mg/dL (ref 6–20)
CHLORIDE: 98 mmol/L — AB (ref 101–111)
CO2: 25 mmol/L (ref 22–32)
Calcium: 9.1 mg/dL (ref 8.9–10.3)
Creatinine, Ser: 0.89 mg/dL (ref 0.44–1.00)
GFR calc Af Amer: >60 mL/min (ref >60)
GFR, EST NON AFRICAN AMERICAN: 55 mL/min — AB (ref >60)
Glucose, Bld: 94 mg/dL (ref 65–99)
POTASSIUM: 4.1 mmol/L (ref 3.5–5.1)
SODIUM: 130 mmol/L — AB (ref 135–145)

## 2017-10-18 LAB — LIPID PANEL
CHOL/HDL RATIO: 2.3 ratio
Cholesterol: 158 mg/dL (ref 0–200)
HDL: 70 mg/dL (ref >40)
LDL CALC: 80 mg/dL (ref 0–99)
Triglycerides: 39 mg/dL (ref <150)
VLDL: 8 mg/dL (ref 0–40)

## 2017-10-18 LAB — HEMOGLOBIN A1C
Hgb A1c MFr Bld: 4.9 % (ref 4.8–5.6)
Mean Plasma Glucose: 93.93 mg/dL

## 2017-10-18 LAB — TROPONIN I

## 2017-10-18 MED ORDER — ASPIRIN 300 MG RE SUPP: 300.0000 mg | Freq: Every day | RECTAL | Status: DC

## 2017-10-18 MED ORDER — ATORVASTATIN CALCIUM 20 MG PO TABS
40.0000 mg | ORAL_TABLET | Freq: Every day | ORAL | Status: DC
  Administered 2017-10-18 – 2017-10-19 (×2): 40 mg via ORAL
  Filled 2017-10-18 (×2): qty 2

## 2017-10-18 MED ORDER — ACETAMINOPHEN 160 MG/5ML PO SOLN: 650.0000 mg | ORAL | Status: DC | PRN

## 2017-10-18 MED ORDER — CLOPIDOGREL BISULFATE 75 MG PO TABS
75.0000 mg | ORAL_TABLET | Freq: Every day | ORAL | Status: DC
  Administered 2017-10-18 – 2017-10-19 (×2): 75 mg via ORAL
  Filled 2017-10-18 (×2): qty 1

## 2017-10-18 MED ORDER — STROKE: EARLY STAGES OF RECOVERY BOOK
Freq: Once | Status: AC
  Administered 2017-10-18: 06:00:00

## 2017-10-18 MED ORDER — ACETAMINOPHEN 325 MG PO TABS
650.0000 mg | ORAL_TABLET | ORAL | Status: DC | PRN
  Administered 2017-10-18: 650 mg via ORAL
  Filled 2017-10-18: qty 2

## 2017-10-18 MED ORDER — ASPIRIN 325 MG PO TABS
325.0000 mg | ORAL_TABLET | Freq: Every day | ORAL | Status: DC
  Filled 2017-10-18: qty 1

## 2017-10-18 MED ORDER — PANTOPRAZOLE SODIUM 40 MG PO TBEC
40.0000 mg | DELAYED_RELEASE_TABLET | Freq: Every day | ORAL | Status: DC
  Administered 2017-10-18 – 2017-10-19 (×2): 40 mg via ORAL
  Filled 2017-10-18 (×2): qty 1

## 2017-10-18 MED ORDER — QUETIAPINE FUMARATE 25 MG PO TABS
25.0000 mg | ORAL_TABLET | Freq: Every day | ORAL | Status: DC
  Filled 2017-10-18: qty 1

## 2017-10-18 MED ORDER — MIRTAZAPINE 15 MG PO TABS
45.0000 mg | ORAL_TABLET | Freq: Every day | ORAL | Status: DC
  Filled 2017-10-18: qty 3

## 2017-10-18 MED ORDER — SODIUM CHLORIDE 0.9 % IV SOLN
INTRAVENOUS | Status: DC
  Administered 2017-10-18: 06:00:00 via INTRAVENOUS

## 2017-10-18 MED ORDER — LORAZEPAM 2 MG/ML IJ SOLN
0.5000 mg | Freq: Once | INTRAMUSCULAR | Status: AC
  Administered 2017-10-18: 11:00:00 0.5 mg via INTRAVENOUS
  Filled 2017-10-18: qty 1

## 2017-10-18 MED ORDER — ACETAMINOPHEN 650 MG RE SUPP
650.0000 mg | RECTAL | Status: DC | PRN
Start: 2017-10-18 — End: 2017-10-19

## 2017-10-18 NOTE — NC FL2 (Signed)
[place] LEVEL OF CARE SCREENING TOOL     IDENTIFICATION  Patient Name: Angel Brandt Birthdate: [birth year] Sex: female Admission Date (Current Location): 10/17/2017  [place] and [place] Number:  Engineering geologist and Address:  [facility], [address] [place], [place] [address]      Provider Number: [id number]  Attending Physician Name and Address:  Loletha Grayer, MD  Relative Name and Phone Number:       Current Level of Care: Hospital Recommended Level of Care: [place] Prior Approval Number:    Date Approved/Denied:   PASRR Number: [id number])  Discharge Plan: SNF    Current Diagnoses: Patient Active Problem List   Diagnosis Date Noted  . TIA (transient ischemic attack) 10/18/2017  . Carcinoma of overlapping sites of right breast in female, estrogen receptor positive ([place]) 02/10/2017  . Diverticulitis 02/10/2016  . H/O malaria 02/10/2016  . Degeneration macular 02/10/2016  . 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 Gait instability 12/04/2014    Orientation RESPIRATION BLADDER Height & Weight     Time, Self, Situation, Place  Normal Continent Weight: 140 lb (63.5 kg) Height:  5\' 5"  (165.1 cm)  BEHAVIORAL SYMPTOMS/MOOD NEUROLOGICAL BOWEL NUTRITION STATUS      Continent Diet( Mech Soft diet w/ Minced meats and gravy(mince any foods needed); Thin liquids. General aspiration precautions )  AMBULATORY STATUS COMMUNICATION OF NEEDS Skin   Extensive Assist Verbally Normal                       Personal Care Assistance Level of Assistance  Bathing, Feeding, Dressing Bathing Assistance: Limited assistance Feeding assistance: Independent Dressing Assistance: Limited assistance     Functional Limitations Info  Sight, Hearing, Speech Sight Info: Impaired Hearing Info: Adequate      SPECIAL CARE FACTORS FREQUENCY  PT (By licensed PT), OT (By licensed OT)     PT Frequency: (5) OT Frequency:  (5)            Contractures      Additional Factors Info  Code Status, Allergies Code Status Info: (Full Code. ) Allergies Info: (Codeine, Penicillins, Aspirin, Codeine, Sulfate, Other, Sulfa Antibiotics)           Current Medications (10/18/2017):  This is the current hospital active medication list Current Facility-Administered Medications  Medication Dose Route Frequency Provider Last Rate Last Dose  . acetaminophen (TYLENOL) tablet 650 mg  650 mg Oral Q4H PRN Saundra Shelling, MD   650 mg at 10/18/17 1642   Or  . acetaminophen (TYLENOL) solution 650 mg  650 mg Per Tube Q4H PRN Saundra Shelling, MD       Or  . acetaminophen (TYLENOL) suppository 650 mg  650 mg Rectal Q4H PRN Pyreddy, Reatha Harps, MD      . atorvastatin (LIPITOR) tablet 40 mg  40 mg Oral q1800 Loletha Grayer, MD   40 mg at 10/18/17 1642  . clopidogrel (PLAVIX) tablet 75 mg  75 mg Oral Daily Loletha Grayer, MD   75 mg at 10/18/17 1642  . mirtazapine (REMERON) tablet 45 mg  45 mg Oral QHS Pyreddy, Pavan, MD      . pantoprazole (PROTONIX) EC tablet 40 mg  40 mg Oral Daily Pyreddy, Pavan, MD   40 mg at 10/18/17 1113  . QUEtiapine (SEROQUEL) tablet 25 mg  25 mg Oral QHS Saundra Shelling, MD         Discharge Medications: Please see discharge summary for a list  of discharge medications.  Relevant Imaging Results:  Relevant Lab Results:   Additional Information (SSN: [id number])  Shunta Mclaurin, Veronia Beets, LCSW

## 2017-10-18 NOTE — Evaluation (Signed)
Occupational Therapy Evaluation Patient Details Name: Brandye Inthavong Spickler MRN: [id number] DOB: 19-Apr-1924 Today's Date: 10/18/2017    History of Present Illness Berdena Jurek  is a 83 y.o. female with a known history of known history of dementia, breast cancer, GERD, osteoporosis presented to the emergency room with nausea and vomiting.  Patient also had some garbled speech around 8:30 PM last night and dizziness.This was reported by patients daughter.  Patient has dementia and not able to give much history.  She was given aspirin by EMS and brought to the emergency room.  Patient was worked up with CT head which showed no acute intracranial pathology.  Her nausea improved in the emergency room.    Clinical Impression   Patient seen for OT evaluation this date.  2 daughters present this date and able to provide most of information regarding history for patient.  Patient has dementia but has increased confusion this date and has been combative as well as trying to get out of bed, pulled IV out earlier.  Daughter whom she lives with is sitting by her side, holding her hand to comfort her.  Patient lives with her daughter who is her primary caregiver.  Pt was able to perform basic self care tasks prior to admission but required assist at times with shoe tying, cooking, cleaning and medication management.  Family reports patient had a walker but does not like to use any adaptive equipment therefore she would tend to be a "furniture walker" and would take sponge baths because she feared the glass doors in the shower would fall on her. She is legally blind in her left eye and only has some peripheral vision in her right eye.  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  Patient presents with muscle weakness, increased confusion from baseline, decreased transfers and functional tasks.  She would benefit from skilled OT to maximize safety and independence in daily tasks.  Daughter reports she would like  to take her home to a familiar environment and she would likely benefit from home health OT.     Follow Up Recommendations  Home health OT    Equipment Recommendations       Recommendations for Other Services       Precautions / Restrictions Precautions Precautions: Fall      Mobility Bed Mobility               General bed mobility comments: will assess next session  Transfers                      Balance                                           ADL either performed or assessed with clinical judgement   ADL Overall ADL's : Needs assistance/impaired   Eating/Feeding Details (indicate cue type and reason): Patient agitated and trying to move arm repeatedly and had pulled out IV earlier.  Grooming: Minimal assistance   Upper Body Bathing: Moderate assistance   Lower Body Bathing: Maximal assistance   Upper Body Dressing : Minimal assistance   Lower Body Dressing: Maximal assistance     Toilet Transfer Details (indicate cue type and reason): Evaluation shortened due to MD coming in during session for assessment, increased confusion and combative at times.  General ADL Comments: Will attempt to assess functional mobility and transfers next date if status improves.      Vision         Perception     Praxis      Pertinent Vitals/Pain Pain Assessment: No/denies pain     Hand Dominance Right   Extremity/Trunk Assessment Upper Extremity Assessment Upper Extremity Assessment: Generalized weakness   Lower Extremity Assessment Lower Extremity Assessment: Defer to PT evaluation       Communication Communication Communication: Deaf in left ear, hard of hearing in right.  Legally blind in left eye and has some peripheral vision in right.   Cognition Arousal/Alertness: Suspect due to medications   Overall Cognitive Status: Impaired/Different from baseline                                  General Comments: Patient has dementia at baseline however her daughters report she is worse today which Zirbes be due to medications she was given.  Daughter reports patient was combative earlier with staff, did not recognize daughter this date and pulled IV out.     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       Prior Functioning/Environment Level of Independence: Needs assistance  Gait / Transfers Assistance Needed: patient has a walker but does not use on a regular basis and does more "furniture walking" per daughter ADL's / Homemaking Assistance Needed: Patient has dementia, she was able to get dressed, toilet and sponge baths.  She required assist with cooking, cleaning and tying shoes.              OT Problem List: Decreased strength;Decreased activity tolerance;Decreased cognition;Decreased knowledge of use of DME or AE;Decreased safety awareness      OT Treatment/Interventions: Self-care/ADL training;DME and/or AE instruction;Therapeutic activities;Therapeutic exercise;Cognitive remediation/compensation;Patient/family education    OT Goals(Current goals can be found in the care plan section) Acute Rehab OT Goals Patient Stated Goal: patient unable to state, family would like her to be independent as possible and do as many things  as she did before admisssion. OT Goal Formulation: With patient/family Time For Goal Achievement: 10/28/17 Potential to Achieve Goals: Fair ADL Goals Pt Will Perform Upper Body Bathing: (P) with set-up;with min assist Pt Will Perform Lower Body Dressing: (P) with set-up;with min assist  OT Frequency: Min 1X/week   Barriers to D/C:            Co-evaluation              AM-PAC PT "6 Clicks" Daily Activity     Outcome Measure Help from another person eating meals?: A Little Help from another person taking care of personal grooming?: A Little Help from another person toileting, which includes using toliet, bedpan, or urinal?:  A Lot Help from another person bathing (including washing, rinsing, drying)?: A Lot Help from another person to put on and taking off regular upper body clothing?: A Little Help from another person to put on and taking off regular lower body clothing?: A Lot 6 Click Score: 15   End of Session    Activity Tolerance: Treatment limited secondary to agitation Patient left: in bed;with call bell/phone within reach;with bed alarm set;with family/visitor present  OT Visit Diagnosis: History of falling (Z91.81);Muscle weakness (generalized) (M62.81)                Time: 8453-6468 OT Time Calculation (min): 19 min Charges:  OT General Charges $OT Visit: 1 Visit OT Evaluation $OT Eval Low Complexity: 1 Low G-Codes: OT G-codes **NOT FOR INPATIENT CLASS** Functional Assessment Tool Used: AM-PAC 6 Clicks Daily Activity Functional Limitation: Self care Self Care Current Status (E3212): At least 40 percent but less than 60 percent impaired, limited or restricted Self Care Goal Status (Y4825): At least 20 percent but less than 40 percent impaired, limited or restricted   Lamel Mccarley T Azarius Lambson, OTR/L, CLT   Vayla Wilhelmi 10/18/2017, 4:00 PM

## 2017-10-18 NOTE — Consult Note (Signed)
Referring Physician: Leslye Peer    Chief Complaint: Nausea, vomiting, slurred speech  HPI: Angel Brandt is an 83 y.o. female with a history of dementia who on yesterday was noted by her daughter to grab her head then have slurred speech.  Patient then began to vomit perfusely.  After four times of vomiting was taken to the bathroom where she held her chest and vomited again.  Patient was generally weak and was brought in for evaluation.  Initial NIHSS of 1.  Date last known well: Date: 10/17/2017 Time last known well: Time: 20:30 tPA Given: No: Improvement in symptoms  Past Medical History:  Diagnosis Date  . At high risk for falls   . Blind   . Breast cancer ([place])   . Cancer ([place])   . GERD (gastroesophageal reflux disease)   . Hearing impaired   . Macular degeneration   . Malaria   . Osteoporosis     Past Surgical History:  Procedure Laterality Date  . BREAST LUMPECTOMY    . BREAST SURGERY      Family history: Father deceased from a cerebral hemorrhage.  Mother with CAD deceased from MI  Social History:  reports that  has never smoked. Her smokeless tobacco use includes snuff. She reports that she does not drink alcohol or use drugs.  Allergies:  Allergies  Allergen Reactions  . Codeine Anaphylaxis  . Penicillins Anaphylaxis and Nausea And Vomiting  . Aspirin Other (See Comments) and Nausea And Vomiting    unknown  . Codeine Sulfate Nausea And Vomiting  . Other Other (See Comments)  . Sulfa Antibiotics Other (See Comments)    unknown    Medications:  I have reviewed the patient's current medications. Prior to Admission:  Medications Prior to Admission  Medication Sig Dispense Refill Last Dose  . acetaminophen (TYLENOL) 500 MG tablet Take 1,000 mg by mouth every 6 (six) hours as needed for mild pain.   Not Taking  . mirtazapine (REMERON) 45 MG tablet Take 45 mg by mouth at bedtime.    10/17/2017 at Unknown time  . omeprazole (PRILOSEC) 20 MG capsule Take 20 mg by  mouth 2 (two) times daily before a meal.    10/17/2017 at Unknown time  . QUEtiapine (SEROQUEL) 25 MG tablet Take 25 mg by mouth at bedtime.   10/17/2017 at Unknown time  . letrozole (FEMARA) 2.5 MG tablet Take 1 tablet (2.5 mg total) by mouth daily. (Patient not taking: Reported on 10/18/2017) 30 tablet 11 Not Taking at Unknown time  . traMADol (ULTRAM) 50 MG tablet Take 1 or 2 po Q 6hrs for pain (Patient not taking: Reported on 10/18/2017) 20 tablet 0 Not Taking at Unknown time   Scheduled: . aspirin  300 mg Rectal Daily   Or  . aspirin  325 mg Oral Daily  . mirtazapine  45 mg Oral QHS  . pantoprazole  40 mg Oral Daily  . QUEtiapine  25 mg Oral QHS    ROS: History obtained from the patient  General ROS: poor apetite Psychological ROS: hallucinations, memory difficulties Ophthalmic ROS: negative for - blurry vision, double vision, eye pain or loss of vision ENT ROS: HOH, dizziness Allergy and Immunology ROS: negative for - hives or itchy/watery eyes Hematological and Lymphatic ROS: negative for - bleeding problems, bruising or swollen lymph nodes Endocrine ROS: negative for - galactorrhea, hair pattern changes, polydipsia/polyuria or temperature intolerance Respiratory ROS: negative for - cough, hemoptysis, shortness of breath or wheezing Cardiovascular ROS: negative for -  chest pain, dyspnea on exertion, edema or irregular heartbeat Gastrointestinal ROS: intermittent abdominal pain Genito-Urinary ROS: negative for - dysuria, hematuria, incontinence or urinary frequency/urgency Musculoskeletal ROS: negative for - joint swelling or muscular weakness Neurological ROS: as noted in HPI Dermatological ROS: negative for rash and skin lesion changes  Physical Examination: Blood pressure (!) 133/41, pulse 64, temperature 98.9 F (37.2 C), temperature source Oral, resp. rate 18, height 5\' 5"  (1.651 m), weight 63.5 kg (140 lb), SpO2 100 %.  HEENT-  Normocephalic, no lesions, without  obvious abnormality.  Normal external eye and conjunctiva.  Normal TM's bilaterally.  Normal auditory canals and external ears. Normal external nose, mucus membranes and septum.  Normal pharynx. Cardiovascular- S1, S2 normal, pulses palpable throughout   Lungs- chest clear, no wheezing, rales, normal symmetric air entry Abdomen- soft, non-tender; bowel sounds normal; no masses,  no organomegaly Extremities- no edema Lymph-no adenopathy palpable Musculoskeletal-arthritic changes in the hands Skin-warm and dry, no hyperpigmentation, vitiligo, or suspicious lesions  Neurological Examination   Mental Status: Alert. Oriented to name and that in the hospital.  Does not know she is in [place].  Does not know the year.  Speech fluent without evidence of aphasia.  Able to follow 3 step commands with some mild reinforcement. Cranial Nerves: II: Discs flat bilaterally; Visual fields grossly normal III,IV, VI: ptosis not present, extra-ocular motions intact bilaterally V,VII: smile symmetric, facial light touch sensation normal bilaterally VIII: hard of hearing IX,X: gag reflex present XI: bilateral shoulder shrug XII: midline tongue extension Motor: Right : Upper extremity   5/5    Left:     Upper extremity   5/5  Lower extremity   5-/5     Lower extremity   5-/5 Tone and bulk:normal tone throughout; no atrophy noted Sensory: Pinprick and light touch intact throughout, bilaterally Deep Tendon Reflexes: 2+ and symmetric with absent AJ's Plantars: Right: upgoing   Left: upgoing Cerebellar: Normal finger-to-nose and normal heel-to-shin testing bilaterally Gait: not tested due safety concerns    Laboratory Studies:  Basic Metabolic Panel: Recent Labs  Lab 10/17/17 2300  NA 127*  K 4.1  CL 94*  CO2 26  GLUCOSE 126*  BUN 16  CREATININE 0.76  CALCIUM 9.3    Liver Function Tests: Recent Labs  Lab 10/17/17 2300  AST 27  ALT 11*  ALKPHOS 91  BILITOT 0.4  PROT 7.4  ALBUMIN 4.2    Recent Labs  Lab 10/17/17 2300  LIPASE 40   No results for input(s): AMMONIA in the last 168 hours.  CBC: Recent Labs  Lab 10/17/17 2300  WBC 9.3  NEUTROABS 7.9*  HGB 11.3*  HCT 31.7*  MCV 89.3  PLT 217    Cardiac Enzymes: Recent Labs  Lab 10/17/17 2300 10/18/17 0405 10/18/17 0927  TROPONINI <0.03 <0.03 <0.03    BNP: Invalid input(s): POCBNP  CBG: No results for input(s): GLUCAP in the last 168 hours.  Microbiology: No results found for this or any previous visit.  Coagulation Studies: No results for input(s): LABPROT, INR in the last 72 hours.  Urinalysis:  Recent Labs  Lab 10/17/17 2316  COLORURINE YELLOW*  LABSPEC 1.014  PHURINE 5.0  GLUCOSEU NEGATIVE  HGBUR SMALL*  BILIRUBINUR NEGATIVE  KETONESUR NEGATIVE  PROTEINUR NEGATIVE  NITRITE NEGATIVE  LEUKOCYTESUR NEGATIVE    Lipid Panel:    Component Value Date/Time   CHOL 158 10/18/2017 0405   TRIG 39 10/18/2017 0405   HDL 70 10/18/2017 0405   CHOLHDL 2.3 10/18/2017  0405   VLDL 8 10/18/2017 0405   LDLCALC 80 10/18/2017 0405    HgbA1C:  Lab Results  Component Value Date   HGBA1C 4.9 10/18/2017    Urine Drug Screen:  No results found for: LABOPIA, COCAINSCRNUR, LABBENZ, AMPHETMU, THCU, LABBARB  Alcohol Level: No results for input(s): ETH in the last 168 hours.   Imaging: Ct Head Wo Contrast  Result Date: 10/18/2017 CLINICAL DATA:  Acute onset of vomiting. Garbled speech. Balance issues and dizziness. EXAM: CT HEAD WITHOUT CONTRAST TECHNIQUE: Contiguous axial images were obtained from the base of the skull through the vertex without intravenous contrast. COMPARISON:  CT of the head performed 08/21/2014 FINDINGS: Brain: No evidence of acute infarction, hemorrhage, hydrocephalus, extra-axial collection or mass lesion/mass effect. Prominence of the ventricles and sulci reflects moderate cortical volume loss. Mild cerebellar atrophy is noted. Scattered periventricular and subcortical white  matter change likely reflects small vessel ischemic microangiopathy. The brainstem and fourth ventricle are within normal limits. The basal ganglia are unremarkable in appearance. The cerebral hemispheres demonstrate grossly normal gray-white differentiation. No mass effect or midline shift is seen. Vascular: No hyperdense vessel or unexpected calcification. Skull: There is no evidence of fracture; visualized osseous structures are unremarkable in appearance. Sinuses/Orbits: The visualized portions of the orbits are within normal limits. There is partial opacification of the right mastoid air cells. The paranasal sinuses and left mastoid air cells are well-aerated. Other: No significant soft tissue abnormalities are seen. IMPRESSION: 1. No acute intracranial pathology seen on CT. 2. Moderate cortical volume loss and scattered small vessel ischemic microangiopathy. 3. Partial opacification of the right mastoid air cells. Electronically Signed   By: Garald Balding M.D.   On: 10/18/2017 00:09   US Carotid Bilateral (at [facility] And [facility] Only)  Result Date: 10/18/2017 CLINICAL DATA:  CVA.  History of smokeless tobacco use. EXAM: BILATERAL CAROTID DUPLEX ULTRASOUND TECHNIQUE: Pearline Cables scale imaging, color Doppler and duplex ultrasound were performed of bilateral carotid and vertebral arteries in the neck. COMPARISON:  None. FINDINGS: Criteria: Quantification of carotid stenosis is based on velocity parameters that correlate the residual internal carotid diameter with NASCET-based stenosis levels, using the diameter of the distal internal carotid lumen as the denominator for stenosis measurement. The following velocity measurements were obtained: RIGHT ICA:  100/21 cm/sec CCA:  16/60 cm/sec SYSTOLIC ICA/CCA RATIO:  1.2 DIASTOLIC ICA/CCA RATIO:  2.1 ECA:  100 cm/sec LEFT ICA:  128/21 cm/sec CCA:  63/01 cm/sec SYSTOLIC ICA/CCA RATIO:  1.6 DIASTOLIC ICA/CCA RATIO:  1.9 ECA:  90 a cm/sec RIGHT CAROTID ARTERY: There is a moderate  amount of atherosclerotic plaque within the right carotid bulb (image 15), extending to involve the origin and proximal aspects of the right internal carotid artery (image 22), not resulting in elevated peak systolic velocities within the interrogated course the right internal carotid artery to suggest a hemodynamically significant stenosis. Borderline elevated peak systolic velocity within distal aspect the right internal carotid artery is felt to be factitiously elevated due to sampling at a location of turbulent flow. RIGHT VERTEBRAL ARTERY:  Antegrade Flow LEFT CAROTID ARTERY: There is a moderate amount of atherosclerotic plaque within the left carotid bulb (image 43), extending to involve the origin and proximal aspect the left internal carotid artery (image 54) resulting in borderline elevated peak systolic velocities within the mid aspect the left internal carotid artery. Greatest acquired peak systolic velocity within mid left ICA measures 128 cm/sec - image 59. LEFT VERTEBRAL ARTERY:  Antegrade flow IMPRESSION: 1. Moderate  amount of left-sided atherosclerotic plaque results in elevated peak systolic velocity compatible with the lower end of the 50-69% luminal narrowing range. Further evaluation with CTA could be performed as clinically indicated. 2. Moderate amount of right-sided atherosclerotic plaque, not definitely resulting in a hemodynamically significant stenosis. Electronically Signed   By: Sandi Mariscal M.D.   On: 10/18/2017 08:51   Dg Chest Port 1 View  Result Date: 10/17/2017 CLINICAL DATA:  Nausea and vomiting EXAM: PORTABLE CHEST 1 VIEW COMPARISON:  None. FINDINGS: Minimal atelectasis at the left base. No focal consolidation. Normal heart size. Aortic atherosclerosis. No pneumothorax. IMPRESSION: Minimal atelectasis or scar at the left base. Negative for edema or infiltrate. Electronically Signed   By: Donavan Foil M.D.   On: 10/17/2017 23:43    Assessment: 83 y.o. female presenting after  an episode of nausea, vomiting and slurred speech. BP elevated.  Episode resolved.  Patient on no antiplatelet therapy at home. Head CT reviewed and shows no acute changes.  Carotid doppler shows left carotid stenosis at 50-69% and right moderate stenosis but less so than left.  LDL 80.    Stroke Risk Factors - none  Plan: 1. HgbA1c pending 2. MRI of the brain without contrast pending 3. PT consult, OT consult, Speech consult 4. Echocardiogram pending 5. Prophylactic therapy-Antiplatelet med: Aspirin - dose 81mg  daily 6. NPO until RN stroke swallow screen 7. Telemetry monitoring 8. Frequent neuro checks   Alexis Goodell, MD Neurology [phone] 10/18/2017, 11:01 AM

## 2017-10-18 NOTE — Progress Notes (Signed)
*  PRELIMINARY RESULTS* Echocardiogram 2D Echocardiogram has been performed.  Angel Brandt 10/18/2017, 1:04 PM

## 2017-10-18 NOTE — H&P (Signed)
[facility] NAME: Angel Brandt    MR#:  [id number]  DATE OF BIRTH:  [birth year]  DATE OF ADMISSION:  10/17/2017  PRIMARY CARE PHYSICIAN: Maryland Pink, MD   REQUESTING/REFERRING PHYSICIAN:   CHIEF COMPLAINT:   Chief Complaint  Patient presents with  . Emesis    HISTORY OF PRESENT ILLNESS: Angel Brandt  is a 83 y.o. female with a known history of known history of dementia, breast cancer, GERD, osteoporosis presented to the emergency room with nausea and vomiting.  Patient also had some garbled speech around 8:30 PM last night and dizziness.This was reported by patients daughter.  Patient has dementia and not able to give much history.  She was given aspirin by EMS and brought to the emergency room.  Patient was worked up with CT head which showed no acute intracranial pathology.  Her nausea improved in the emergency room.  Diastolic blood pressure was elevated.dependent on activities of daily living .hospitalist service was consulted for further care.  PAST MEDICAL HISTORY:   Past Medical History:  Diagnosis Date  . At high risk for falls   . Blind   . Breast cancer ([place])   . Cancer ([place])   . GERD (gastroesophageal reflux disease)   . Hearing impaired   . Macular degeneration   . Malaria   . Osteoporosis     PAST SURGICAL HISTORY:  Past Surgical History:  Procedure Laterality Date  . BREAST LUMPECTOMY    . BREAST SURGERY      SOCIAL HISTORY:  Social History   Tobacco Use  . Smoking status: Never Smoker  . Smokeless tobacco: Current User    Types: Snuff  Substance Use Topics  . Alcohol use: No    FAMILY HISTORY: History reviewed. No pertinent family history.  DRUG ALLERGIES:  Allergies  Allergen Reactions  . Codeine Anaphylaxis  . Penicillins Anaphylaxis and Nausea And Vomiting  . Aspirin Other (See Comments) and Nausea And Vomiting    unknown  . Codeine Sulfate Nausea And Vomiting  . Other Other  (See Comments)  . Sulfa Antibiotics Other (See Comments)    unknown    REVIEW OF SYSTEMS:   Could not be obtained as patient is demented MEDICATIONS AT HOME:  Prior to Admission medications   Medication Sig Start Date End Date Taking? Authorizing Provider  acetaminophen (TYLENOL) 500 MG tablet Take 1,000 mg by mouth every 6 (six) hours as needed for mild pain.   Yes [provider]  mirtazapine (REMERON) 45 MG tablet Take 45 mg by mouth at bedtime.  05/27/15  Yes [provider]  omeprazole (PRILOSEC) 20 MG capsule Take 20 mg by mouth 2 (two) times daily before a meal.  12/23/15  Yes [provider]  QUEtiapine (SEROQUEL) 25 MG tablet Take 25 mg by mouth at bedtime.   Yes [provider]  letrozole (FEMARA) 2.5 MG tablet Take 1 tablet (2.5 mg total) by mouth daily. Patient not taking: Reported on 10/18/2017 04/14/17   Angel Sickle, MD  traMADol Veatrice Bourbon) 50 MG tablet Take 1 or 2 po Q 6hrs for pain Patient not taking: Reported on 10/18/2017 12/02/15   Angel Porter, MD      PHYSICAL EXAMINATION:   VITAL SIGNS: Blood pressure 137/60, pulse 74, temperature (!) 97.4 F (36.3 C), temperature source Oral, resp. rate 19, height 5\' 5"  (1.651 m), weight 63.5 kg (140 lb), SpO2 100 %.  GENERAL:  83 y.o.-year-old patient  lying in the bed with no acute distress.  EYES: Pupils equal, round, reactive to light and accommodation. No scleral icterus. Extraocular muscles intact.  HEENT: Head atraumatic, normocephalic. Oropharynx and nasopharynx clear.  NECK:  Supple, no jugular venous distention. No thyroid enlargement, no tenderness.  LUNGS: Normal breath sounds bilaterally, no wheezing, rales,rhonchi or crepitation. No use of accessory muscles of respiration.  CARDIOVASCULAR: S1, S2 normal. No murmurs, rubs, or gallops.  ABDOMEN: Soft, nontender, nondistended. Bowel sounds present. No organomegaly or mass.  EXTREMITIES: No pedal edema, cyanosis, or clubbing.   NEUROLOGIC: Awake, alert, demented Cranial nerves II through XII appear to be intact. Muscle strength 5/5 in all extremities. Sensation intact. Gait not checked.  PSYCHIATRIC: could not be obtained SKIN: No obvious rash, lesion, or ulcer.   LABORATORY PANEL:   CBC Recent Labs  Lab 10/17/17 2300  WBC 9.3  HGB 11.3*  HCT 31.7*  PLT 217  MCV 89.3  MCH 31.8  MCHC 35.6  RDW 13.0  LYMPHSABS 0.7*  MONOABS 0.6  EOSABS 0.1  BASOSABS 0.1   ------------------------------------------------------------------------------------------------------------------  Chemistries  Recent Labs  Lab 10/17/17 2300  NA 127*  K 4.1  CL 94*  CO2 26  GLUCOSE 126*  BUN 16  CREATININE 0.76  CALCIUM 9.3  AST 27  ALT 11*  ALKPHOS 91  BILITOT 0.4   ------------------------------------------------------------------------------------------------------------------ estimated creatinine clearance is 41.2 mL/min (by C-G formula based on SCr of 0.76 mg/dL). ------------------------------------------------------------------------------------------------------------------ No results for input(s): TSH, T4TOTAL, T3FREE, THYROIDAB in the last 72 hours.  Invalid input(s): FREET3   Coagulation profile No results for input(s): INR, PROTIME in the last 168 hours. ------------------------------------------------------------------------------------------------------------------- No results for input(s): DDIMER in the last 72 hours. -------------------------------------------------------------------------------------------------------------------  Cardiac Enzymes Recent Labs  Lab 10/17/17 2300  TROPONINI <0.03   ------------------------------------------------------------------------------------------------------------------ Invalid input(s): POCBNP  ---------------------------------------------------------------------------------------------------------------  Urinalysis    Component Value Date/Time    COLORURINE YELLOW (A) 10/17/2017 2316   APPEARANCEUR CLEAR (A) 10/17/2017 2316   APPEARANCEUR Clear 08/21/2014 1938   LABSPEC 1.014 10/17/2017 2316   LABSPEC 1.006 08/21/2014 1938   PHURINE 5.0 10/17/2017 2316   GLUCOSEU NEGATIVE 10/17/2017 2316   GLUCOSEU Negative 08/21/2014 1938   HGBUR SMALL (A) 10/17/2017 2316   BILIRUBINUR NEGATIVE 10/17/2017 2316   BILIRUBINUR Negative 08/21/2014 1938   KETONESUR NEGATIVE 10/17/2017 2316   PROTEINUR NEGATIVE 10/17/2017 2316   NITRITE NEGATIVE 10/17/2017 2316   LEUKOCYTESUR NEGATIVE 10/17/2017 2316   LEUKOCYTESUR Trace 08/21/2014 1938     RADIOLOGY: Ct Head Wo Contrast  Result Date: 10/18/2017 CLINICAL DATA:  Acute onset of vomiting. Garbled speech. Balance issues and dizziness. EXAM: CT HEAD WITHOUT CONTRAST TECHNIQUE: Contiguous axial images were obtained from the base of the skull through the vertex without intravenous contrast. COMPARISON:  CT of the head performed 08/21/2014 FINDINGS: Brain: No evidence of acute infarction, hemorrhage, hydrocephalus, extra-axial collection or mass lesion/mass effect. Prominence of the ventricles and sulci reflects moderate cortical volume loss. Mild cerebellar atrophy is noted. Scattered periventricular and subcortical white matter change likely reflects small vessel ischemic microangiopathy. The brainstem and fourth ventricle are within normal limits. The basal ganglia are unremarkable in appearance. The cerebral hemispheres demonstrate grossly normal gray-white differentiation. No mass effect or midline shift is seen. Vascular: No hyperdense vessel or unexpected calcification. Skull: There is no evidence of fracture; visualized osseous structures are unremarkable in appearance. Sinuses/Orbits: The visualized portions of the orbits are within normal limits. There is partial opacification of the right mastoid air cells. The paranasal sinuses and  left mastoid air cells are well-aerated. Other: No significant soft  tissue abnormalities are seen. IMPRESSION: 1. No acute intracranial pathology seen on CT. 2. Moderate cortical volume loss and scattered small vessel ischemic microangiopathy. 3. Partial opacification of the right mastoid air cells. Electronically Signed   By: Garald Balding M.D.   On: 10/18/2017 00:09   Dg Chest Port 1 View  Result Date: 10/17/2017 CLINICAL DATA:  Nausea and vomiting EXAM: PORTABLE CHEST 1 VIEW COMPARISON:  None. FINDINGS: Minimal atelectasis at the left base. No focal consolidation. Normal heart size. Aortic atherosclerosis. No pneumothorax. IMPRESSION: Minimal atelectasis or scar at the left base. Negative for edema or infiltrate. Electronically Signed   By: Donavan Foil M.D.   On: 10/17/2017 23:43    EKG: Orders placed or performed during the hospital encounter of 10/17/17  . ED EKG  . ED EKG    IMPRESSION AND PLAN: 83 year old female patient with history of breast cancer, GERD, osteoporosis, dementia presented to the emergency room with nausea, vomiting and garbled speech.  Admitting diagnosis 1.  TIA 2.  Uncontrolled hypertension 3.  Nausea and vomiting 4 dementia. 5.  Osteoporosis 6.  GERD 7.  History of breast cancer Treatment plan Admit patient to medical floor Neurochecks MRI brain and MRA brain to assess for CV Carotid ultrasound to rule out obstruction, echocardiogram Aspirin 325 daily Neurology consultation Cycle troponin All the records are reviewed and case discussed with ED provider. Management plans discussed with the patient, family and they are in agreement.  CODE STATUS:FULL CODE Code Status History    This patient does not have a recorded code status. Please follow your organizational policy for patients in this situation.       TOTAL TIME TAKING CARE OF THIS PATIENT: 50 minutes.    Saundra Shelling M.D on 10/18/2017 at 2:15 AM  Between 7am to 6pm - Pager - [phone]  After 6pm go to [email or web address] - password [email or web address] [facility]  [facility]  Office  [phone]  CC: Primary care physician; Maryland Pink, MD

## 2017-10-18 NOTE — Progress Notes (Signed)
Patient ID: Angel Brandt, female   DOB: Jun 10, 1924, 83 y.o.   MRN: [id number]  [employer] PROGRESS NOTE  Angel Brandt YDX:[id number] DOB: Apr 11, 1924 DOA: 10/17/2017 PCP: Maryland Pink, MD  HPI/Subjective: Patient brought in by family with garbled speech.  She had 4 episodes of vomiting.  Patient complained of some chest discomfort and also dizziness.  Patient felt well today.  No further vomiting and ate lunch and breakfast.  Needed to be medicated with Ativan prior to getting into the MRI.  When physical therapy walked with her she slumped back into the chair and was off balance.  Objective: Vitals:   10/18/17 1042 10/18/17 1316  BP: (!) 133/41 (!) 140/46  Pulse: 64 73  Resp:    Temp: 98.9 F (37.2 C) 98.7 F (37.1 C)  SpO2: 100% 100%    Filed Weights   10/17/17 2217  Weight: 63.5 kg (140 lb)    ROS: Review of Systems  Unable to perform ROS: Dementia  Respiratory: Negative for shortness of breath.   Cardiovascular: Negative for chest pain.  Gastrointestinal: Negative for abdominal pain.   Exam: Physical Exam  HENT:  Nose: No mucosal edema.  Mouth/Throat: No oropharyngeal exudate or posterior oropharyngeal edema.  Tympanic membrane blocked by wax  Eyes: Conjunctivae, EOM and lids are normal. Pupils are equal, round, and reactive to light.  Neck: No JVD present. Carotid bruit is not present. No edema present. No thyroid mass and no thyromegaly present.  Cardiovascular: S1 normal and S2 normal. Exam reveals no gallop.  No murmur heard. Pulses:      Dorsalis pedis pulses are 2+ on the right side, and 2+ on the left side.  Respiratory: No respiratory distress. She has no wheezes. She has no rhonchi. She has no rales.  GI: Soft. Bowel sounds are normal. There is no tenderness.  Musculoskeletal:       Right ankle: She exhibits no swelling.       Left ankle: She exhibits no swelling.  Lymphadenopathy:    She has no cervical adenopathy.  Neurological: She is alert.  No cranial nerve deficit.  Skin: Skin is warm. No rash noted. Nails show no clubbing.  Psychiatric: She has a normal mood and affect.      Data Reviewed: Basic Metabolic Panel: Recent Labs  Lab 10/17/17 2300 10/18/17 1451  NA 127* 130*  K 4.1 4.1  CL 94* 98*  CO2 26 25  GLUCOSE 126* 94  BUN 16 14  CREATININE 0.76 0.89  CALCIUM 9.3 9.1   Liver Function Tests: Recent Labs  Lab 10/17/17 2300  AST 27  ALT 11*  ALKPHOS 91  BILITOT 0.4  PROT 7.4  ALBUMIN 4.2   Recent Labs  Lab 10/17/17 2300  LIPASE 40   CBC: Recent Labs  Lab 10/17/17 2300  WBC 9.3  NEUTROABS 7.9*  HGB 11.3*  HCT 31.7*  MCV 89.3  PLT 217   Cardiac Enzymes: Recent Labs  Lab 10/17/17 2300 10/18/17 0405 10/18/17 0927 10/18/17 1451  TROPONINI <0.03 <0.03 <0.03 <0.03     Studies: Ct Head Wo Contrast  Result Date: 10/18/2017 CLINICAL DATA:  Acute onset of vomiting. Garbled speech. Balance issues and dizziness. EXAM: CT HEAD WITHOUT CONTRAST TECHNIQUE: Contiguous axial images were obtained from the base of the skull through the vertex without intravenous contrast. COMPARISON:  CT of the head performed 08/21/2014 FINDINGS: Brain: No evidence of acute infarction, hemorrhage, hydrocephalus, extra-axial collection or mass lesion/mass effect. Prominence of the ventricles and  sulci reflects moderate cortical volume loss. Mild cerebellar atrophy is noted. Scattered periventricular and subcortical white matter change likely reflects small vessel ischemic microangiopathy. The brainstem and fourth ventricle are within normal limits. The basal ganglia are unremarkable in appearance. The cerebral hemispheres demonstrate grossly normal gray-white differentiation. No mass effect or midline shift is seen. Vascular: No hyperdense vessel or unexpected calcification. Skull: There is no evidence of fracture; visualized osseous structures are unremarkable in appearance. Sinuses/Orbits: The visualized portions of the  orbits are within normal limits. There is partial opacification of the right mastoid air cells. The paranasal sinuses and left mastoid air cells are well-aerated. Other: No significant soft tissue abnormalities are seen. IMPRESSION: 1. No acute intracranial pathology seen on CT. 2. Moderate cortical volume loss and scattered small vessel ischemic microangiopathy. 3. Partial opacification of the right mastoid air cells. Electronically Signed   By: Garald Balding M.D.   On: 10/18/2017 00:09   Mr Brain Wo Contrast  Result Date: 10/18/2017 CLINICAL DATA:  TIA.  Dementia and breast cancer. EXAM: MRI HEAD WITHOUT CONTRAST TECHNIQUE: Multiplanar, multiecho pulse sequences of the brain and surrounding structures were obtained without intravenous contrast. COMPARISON:  Head CT from yesterday FINDINGS: Advanced motion degradation. Axial diffusion imaging is reasonably diagnostic and negative for infarct. No hydrocephalus, shift, or visible collection. There is a small incidental lipoma posterior to the tectum. Atrophy with ventriculomegaly. Mild periventricular chronic small vessel ischemic type change. IMPRESSION: 1. Severe motion degradation. 2. Diffusion imaging is diagnostic and negative for acute infarct. Electronically Signed   By: Monte Fantasia M.D.   On: 10/18/2017 13:10   US Carotid Bilateral (at [facility] Only)  Result Date: 10/18/2017 CLINICAL DATA:  CVA.  History of smokeless tobacco use. EXAM: BILATERAL CAROTID DUPLEX ULTRASOUND TECHNIQUE: Pearline Cables scale imaging, color Doppler and duplex ultrasound were performed of bilateral carotid and vertebral arteries in the neck. COMPARISON:  None. FINDINGS: Criteria: Quantification of carotid stenosis is based on velocity parameters that correlate the residual internal carotid diameter with NASCET-based stenosis levels, using the diameter of the distal internal carotid lumen as the denominator for stenosis measurement. The following velocity measurements were  obtained: RIGHT ICA:  100/21 cm/sec CCA:  11/91 cm/sec SYSTOLIC ICA/CCA RATIO:  1.2 DIASTOLIC ICA/CCA RATIO:  2.1 ECA:  100 cm/sec LEFT ICA:  128/21 cm/sec CCA:  47/82 cm/sec SYSTOLIC ICA/CCA RATIO:  1.6 DIASTOLIC ICA/CCA RATIO:  1.9 ECA:  90 a cm/sec RIGHT CAROTID ARTERY: There is a moderate amount of atherosclerotic plaque within the right carotid bulb (image 15), extending to involve the origin and proximal aspects of the right internal carotid artery (image 22), not resulting in elevated peak systolic velocities within the interrogated course the right internal carotid artery to suggest a hemodynamically significant stenosis. Borderline elevated peak systolic velocity within distal aspect the right internal carotid artery is felt to be factitiously elevated due to sampling at a location of turbulent flow. RIGHT VERTEBRAL ARTERY:  Antegrade Flow LEFT CAROTID ARTERY: There is a moderate amount of atherosclerotic plaque within the left carotid bulb (image 43), extending to involve the origin and proximal aspect the left internal carotid artery (image 54) resulting in borderline elevated peak systolic velocities within the mid aspect the left internal carotid artery. Greatest acquired peak systolic velocity within mid left ICA measures 128 cm/sec - image 59. LEFT VERTEBRAL ARTERY:  Antegrade flow IMPRESSION: 1. Moderate amount of left-sided atherosclerotic plaque results in elevated peak systolic velocity compatible with the lower end of the  50-69% luminal narrowing range. Further evaluation with CTA could be performed as clinically indicated. 2. Moderate amount of right-sided atherosclerotic plaque, not definitely resulting in a hemodynamically significant stenosis. Electronically Signed   By: Sandi Mariscal M.D.   On: 10/18/2017 08:51   Dg Chest Port 1 View  Result Date: 10/17/2017 CLINICAL DATA:  Nausea and vomiting EXAM: PORTABLE CHEST 1 VIEW COMPARISON:  None. FINDINGS: Minimal atelectasis at the left base.  No focal consolidation. Normal heart size. Aortic atherosclerosis. No pneumothorax. IMPRESSION: Minimal atelectasis or scar at the left base. Negative for edema or infiltrate. Electronically Signed   By: Donavan Foil M.D.   On: 10/17/2017 23:43    Scheduled Meds: . atorvastatin  40 mg Oral q1800  . clopidogrel  75 mg Oral Daily  . mirtazapine  45 mg Oral QHS  . pantoprazole  40 mg Oral Daily  . QUEtiapine  25 mg Oral QHS   Continuous Infusions:  Assessment/Plan:  1. Possible TIA,  carotid stenosis.  The patient has an allergy to aspirin so Plavix and atorvastatin prescribed. 2. Unsteady gait.  Could be secondary to the Ativan given prior to MRI.  Could also be secondary to the dizziness the patient described prior to coming in. 3. Nausea vomiting seems to have resolved. 4. Dementia with behavioral disturbance on Seroquel at night and Remeron at night 5. History of breast cancer 6. Hyponatremia.  Stop IV fluids since the IV is in a tough spot.  Sodium improved  Code Status:     Code Status Orders  (From admission, onward)        Start     Ordered   10/18/17 0342  Full code  Continuous     10/18/17 0341    Code Status History    Date Active Date Inactive Code Status Order ID Comments User Context   This patient has a current code status but no historical code status.    Advance Directive Documentation     Most Recent Value  Type of Advance Directive  Healthcare Power of Attorney [Melinda Holmes]  Pre-existing out of facility DNR order (yellow form or pink MOST form)  No data  "MOST" Form in Place?  No data     Family Communication: Family at bedside Disposition Plan: Hopefully can go home.  Will need physical therapy reevaluation tomorrow morning prior to making a decision.  Time spent: 28 minutes  [place]

## 2017-10-18 NOTE — Progress Notes (Signed)
Physical Therapy Evaluation Patient Details Name: Angel Brandt MRN: [id number] DOB: [birth year] Today's Date: 10/18/2017   History of Present Illness  Angel Brandt  is a 83 y.o. female with a known history of known history of dementia, breast cancer, GERD, osteoporosis presented to the emergency room with nausea and vomiting.  Patient also had some garbled speech around 8:30 PM last night and dizziness.This was reported by patients daughter.  Patient has dementia and not able to give much history.  She was given aspirin by EMS and brought to the emergency room.  Patient was worked up with CT head which showed no acute intracranial pathology.  Her nausea improved in the emergency room.   Clinical Impression  Pt admitted with above diagnosis. Pt currently with functional limitations due to the deficits listed below (see PT Problem List).  Pt received and left upright in recliner so bed mobility not assessed during evaluation. Pt is unsteady with transfers requiring +2 support to stabilize. She leans backwards initially but improves balance with extended time in standing. She does not like to use assistive devices so hand held assistance utilized for transfers. Pt ambulates approximately 30' taking short shuffling steps with frequent LE buckling. She is very unsteady and falls left, right, and backwards. She requires modA+2 to remain standing with ambulation. Chair follow performed so that pt can sit when tired as she fatigued when up with staff earlier today. Pt denies fatigue but is unsafe to ambulate farther at this time due to unsteadiness. Suspect unsteadiness is at least partially due to medications required for sedation earlier today. From a pure safety perspective, at this time therapy would recommend SNF placement. However pt has great family support and given her dementia it would be ideal for her to be able to return home with 24/7 support from family. Will see patient tomorrow per conversation with  MD to reassess and see if she is a candidate to return home with family. If pt returns home she will need HH PT and 24/7 assistance. [place] need BSC if unsteadiness doesn't significantly improve for her to safely ambulate to bathroom. She will benefit from PT services to address deficits in strength, balance, and mobility in order to return to full function at home.      Follow Up Recommendations SNF;Other (comment)(Will work towards discharge home with 24/7 support)    Equipment Recommendations  None recommended by PT    Recommendations for Other Services       Precautions / Restrictions Precautions Precautions: Fall Restrictions Weight Bearing Restrictions: No      Mobility  Bed Mobility               General bed mobility comments: Pt received and left upright in recliner. Not assessed  Transfers Overall transfer level: Needs assistance Equipment used: 2 person hand held assist Transfers: Sit to/from Stand Sit to Stand: Min assist;+2 safety/equipment         General transfer comment: Pt is unsteady with transfers requiring +2 support to stabilize. She leans backwards initially but improves balance with extended time in standing. She does not like to use assistive devices to hand held assistance utilized  Ambulation/Gait Ambulation/Gait assistance: Mod assist;+2 physical assistance Ambulation Distance (Feet): 30 Feet Assistive device: 2 person hand held assist Gait Pattern/deviations: Staggering right;Staggering left;Shuffle Gait velocity: Decreased   General Gait Details: Pt takes short shuffling steps with frequent LE buckling. She is very unsteady and falls left, right, and backwards. She requires modA+2 to remain  standing with ambulation. Chair follow performed so that pt can sit when tired as she fatigued when up with staff earlier today. Pt denies fatigue but is unsafe to ambulate farther at this time. Suspect unsteadiness is partially due to medications  Stairs             Wheelchair Mobility    Modified Rankin (Stroke Patients Only)       Balance Overall balance assessment: Needs assistance Sitting-balance support: No upper extremity supported Sitting balance-Leahy Scale: Fair     Standing balance support: Bilateral upper extremity supported Standing balance-Leahy Scale: Poor Standing balance comment: Pt requiring support to remain in standing                             Pertinent Vitals/Pain Pain Assessment: No/denies pain(Had headache early but denies now)    Home Living Family/patient expects to be discharged to:: Private residence Living Arrangements: Children Available Help at Discharge: Family;Available 24 hours/day Type of Home: House Home Access: Stairs to enter Entrance Stairs-Rails: None(Can hold onto pole) Entrance Stairs-Number of Steps: 1 step to enter Home Layout: One level Home Equipment: Tub bench;Walker - 2 wheels;Walker - 4 wheels Additional Comments: 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     Prior Function Level of Independence: Needs assistance   Gait / Transfers Assistance Needed: Daughter reports patient was a "furniture" walker prior to admission, has a walker but does not like to use it, she does not like any adaptive equipment in general.   ADL's / Homemaking Assistance Needed: Patient has dementia, she was able to get dressed, toilet and sponge baths.  She required assist with cooking, cleaning and tying shoes.          Hand Dominance   Dominant Hand: Right    Extremity/Trunk Assessment   Upper Extremity Assessment Upper Extremity Assessment: Defer to OT evaluation    Lower Extremity Assessment Lower Extremity Assessment: Generalized weakness       Communication   Communication: HOH  Cognition Arousal/Alertness: Awake/alert Behavior During  Therapy: WFL for tasks assessed/performed Overall Cognitive Status: History of cognitive impairments - at baseline                                 General Comments: Patient has dementia at baseline however her daughters report she has been worse since admission. Daughter reports patient was combative earlier with staff. She is currently calm with therapist. At baseline she is AOx2, disoriented to time and paritally oriented to situation.      General Comments      Exercises     Assessment/Plan    PT Assessment Patient needs continued PT services  PT Problem List Decreased strength;Decreased activity tolerance;Decreased balance;Decreased mobility;Decreased cognition;Decreased knowledge of use of DME;Decreased safety awareness       PT Treatment Interventions DME instruction;Gait training;Stair training;Functional mobility training;Therapeutic activities;Therapeutic exercise;Balance training;Neuromuscular re-education;Patient/family education    PT Goals (Current goals can be found in the Care Plan section)  Acute Rehab PT Goals Patient Stated Goal: Return home to prior function PT Goal Formulation: With family Time For Goal Achievement: 11/01/17 Potential to Achieve Goals: Fair    Frequency Min 2X/week   Barriers to discharge   Pt has 24/7 support from family  and caregivers    Co-evaluation               AM-PAC PT "6 Clicks" Daily Activity  Outcome Measure Difficulty turning over in bed (including adjusting bedclothes, sheets and blankets)?: Unable Difficulty moving from lying on back to sitting on the side of the bed? : Unable Difficulty sitting down on and standing up from a chair with arms (e.g., wheelchair, bedside commode, etc,.)?: Unable Help needed moving to and from a bed to chair (including a wheelchair)?: A Little Help needed walking in hospital room?: A Lot Help needed climbing 3-5 steps with a railing? : Total 6 Click Score: 9    End of  Session Equipment Utilized During Treatment: Gait belt Activity Tolerance: Patient tolerated treatment well Patient left: in chair;with call bell/phone within reach;with chair alarm set;with family/visitor present Nurse Communication: Mobility status PT Visit Diagnosis: Unsteadiness on feet (R26.81);Muscle weakness (generalized) (M62.81);Difficulty in walking, not elsewhere classified (R26.2)    Time: 1856-3149 PT Time Calculation (min) (ACUTE ONLY): 25 min   Charges:   PT Evaluation $PT Eval Low Complexity: 1 Low PT Treatments $Gait Training: 8-22 mins   PT G Codes:   PT G-Codes **NOT FOR INPATIENT CLASS** Functional Assessment Tool Used: AM-PAC 6 Clicks Basic Mobility Functional Limitation: Mobility: Walking and moving around Mobility: Walking and Moving Around Current Status (F0263): At least 60 percent but less than 80 percent impaired, limited or restricted Mobility: Walking and Moving Around Goal Status [phone]): At least 20 percent but less than 40 percent impaired, limited or restricted    Phillips Grout PT, DPT    Angel Brandt 10/18/2017, 4:37 PM

## 2017-10-18 NOTE — Evaluation (Addendum)
Clinical/Bedside Swallow Evaluation Patient Details  Name: Angel Brandt MRN: [id number] Date of Birth: 1924-06-09  Today's Date: 10/18/2017 Time: SLP Start Time (ACUTE ONLY): 1062 SLP Stop Time (ACUTE ONLY): 1025 SLP Time Calculation (min) (ACUTE ONLY): 60 min  Past Medical History:  Past Medical History:  Diagnosis Date  . At high risk for falls   . Blind   . Breast cancer ([place])   . Cancer ([place])   . GERD (gastroesophageal reflux disease)   . Hearing impaired   . Macular degeneration   . Malaria   . Osteoporosis    Past Surgical History:  Past Surgical History:  Procedure Laterality Date  . BREAST LUMPECTOMY    . BREAST SURGERY     HPI:  Pt is a 83 y.o. female brought to the ED from home via EMS with multiple medical complaints.  Patient has a history of Dementia w/ some s/s of Sundowning in the afternoons/evening recently per Dtr, macular degeneration, GERD whose daughter states she had one episode of vomiting approximately 30 minutes prior to arrival.  Daughter states around 8:30 PM patient describes her head, her speech was garbled for a few minutes and patient complained of chest pain at that time.  Told her daughter she felt dizzy and off balance.  Daughter denies facial droop, unilateral weakness, unsteady gait.  Patient currently denies any complaints. pt resting in bed. HOH - hears best from R ear. Baseline Dementia. Pleasant, awake, answered few basic questions re: self. Daughter stated she minces foods at home for easier mastication and chewing for pt d/t lacking dentition - does not wear the denture plate baseline. Pt can feed self and does best w/ reduced distractions.   Assessment / Plan / Recommendation Clinical Impression  Pt appears to present w/ adequate oropharyngeal phase swallow function w/ no overt pharyngeal phase deficits noted; min increased oral phase time for mastication d/t lacking dentition for full mastication (pt does not wear her denture plate per  Daughter d/t fit issues). Pt consumed trials of thin liquids, pureese, and minced/broken-down mech soft foods w/ no overt s/s of aspiration noted; no decline in vocal quality or respiratory status during/post intake. During the oral phase, she exhibited adequate motor function and control of boluses but needed min extra time for full mastication of increased texture d/t lacking dentition. Pt fed self w/ tray setup. Daughter stated she helps to prepare her foods at home; stated pt appears at her baseline w/ regard to eating/drinking/swallowing today. Recommend a mech soft diet w/ minced meats; moistened foods; aspiration precautions; Pills in Puree if needed per NSG. No further skilled ST services indicated at this time. NSG to reconsult if any decline in status while admitted. With regard to Cognitive assessment, Daughter did not indicate any change from her baseline; pt does have Dementia and is exhibiting min s/s of sun-downing per Daughter. Discussed ways to structure pt's environment, keep a routine, and reduce distractions. Encouraged Daughter to f/u w/ a cognitive assessment if she feels pt has had a significant deline post discharge home and is back in her routine and environment.   SLP Visit Diagnosis: Dysphagia, oral phase (R13.11)    Aspiration Risk  (reduced )    Diet Recommendation  Mech Soft diet w/ Minced meats and gravy(mince any foods needed); Thin liquids. General aspiration precautions and tray setup at all meals; reduce distractions during meals  Medication Administration: Whole meds with puree(if needed for easier swallowing)    Other  Recommendations Recommended Consults: (  Dietician f/u if needed) Oral Care Recommendations: Oral care BID;Staff/trained caregiver to provide oral care Other Recommendations: (n/a)   Follow up Recommendations None      Frequency and Duration (n/a)  (n/a)       Prognosis Prognosis for Safe Diet Advancement: Good Barriers to Reach Goals:  Cognitive deficits      Swallow Study   General Date of Onset: 10/17/17 HPI: Pt is a 83 y.o. female brought to the ED from home via EMS with multiple medical complaints.  Patient has a history of Dementia w/ some s/s of Sundowning in the afternoons/evening recently per Dtr, macular degeneration, GERD whose daughter states she had one episode of vomiting approximately 30 minutes prior to arrival.  Daughter states around 8:30 PM patient describes her head, her speech was garbled for a few minutes and patient complained of chest pain at that time.  Told her daughter she felt dizzy and off balance.  Daughter denies facial droop, unilateral weakness, unsteady gait.  Patient currently denies any complaints. pt resting in bed. HOH - hears best from R ear. Baseline Dementia. Pleasant, awake, answered few basic questions re: self. Daughter stated she minces foods at home for easier mastication and chewing for pt d/t lacking dentition - does not wear the denture plate baseline. Pt can feed self and does best w/ reduced distractions.  Type of Study: Bedside Swallow Evaluation Previous Swallow Assessment: none Diet Prior to this Study: Dysphagia 2 (chopped);Dysphagia 3 (soft);Thin liquids Temperature Spikes Noted: No(wbc 9.3) Respiratory Status: Room air History of Recent Intubation: No Behavior/Cognition: Alert;Cooperative;Pleasant mood;Confused;Distractible;Requires cueing(baseline Dementia) Oral Cavity Assessment: Within Functional Limits Oral Care Completed by SLP: Recent completion by staff Oral Cavity - Dentition: Missing dentition(does not wear her denture plate baseline) Vision: Functional for self-feeding(adequate when in front of her) Self-Feeding Abilities: Able to feed self;Needs set up Patient Positioning: Upright in bed Baseline Vocal Quality: Low vocal intensity(baseline) Volitional Cough: Strong Volitional Swallow: Able to elicit    Oral/Motor/Sensory Function Overall Oral Motor/Sensory  Function: Within functional limits   Ice Chips Ice chips: Not tested   Thin Liquid Thin Liquid: Within functional limits Presentation: Cup;Self Fed;Straw(~4 ozs )    Nectar Thick Nectar Thick Liquid: Not tested   Honey Thick Honey Thick Liquid: Not tested   Puree Puree: Within functional limits Presentation: Self Fed;Spoon(8 trials)   Solid   GO   Solid: Impaired Presentation: Self Fed;Spoon(10+ bites of minced/broken-down foods) Oral Phase Impairments: Impaired mastication(lacking dentition) Pharyngeal Phase Impairments: (none)    Functional Assessment Tool Used: clinical judgement Functional Limitations: Swallowing Swallow Current Status (S9373): At least 1 percent but less than 20 percent impaired, limited or restricted Swallow Goal Status [phone]): At least 1 percent but less than 20 percent impaired, limited or restricted Swallow Discharge Status [phone]): At least 1 percent but less than 20 percent impaired, limited or restricted     Orinda Kenner, MS, CCC-SLP Watson,Katherine 10/18/2017,10:55 AM

## 2017-10-19 DIAGNOSIS — R112 Nausea with vomiting, unspecified: Secondary | ICD-10-CM | POA: Diagnosis not present

## 2017-10-19 DIAGNOSIS — E871 Hypo-osmolality and hyponatremia: Secondary | ICD-10-CM

## 2017-10-19 DIAGNOSIS — G459 Transient cerebral ischemic attack, unspecified: Secondary | ICD-10-CM | POA: Diagnosis not present

## 2017-10-19 LAB — BASIC METABOLIC PANEL
ANION GAP: 6 (ref 5–15)
BUN: 12 mg/dL (ref 6–20)
CHLORIDE: 100 mmol/L — AB (ref 101–111)
CO2: 24 mmol/L (ref 22–32)
Calcium: 8.7 mg/dL — ABNORMAL LOW (ref 8.9–10.3)
Creatinine, Ser: 0.78 mg/dL (ref 0.44–1.00)
Glucose, Bld: 84 mg/dL (ref 65–99)
POTASSIUM: 4.2 mmol/L (ref 3.5–5.1)
SODIUM: 130 mmol/L — AB (ref 135–145)

## 2017-10-19 MED ORDER — ASPIRIN EC 81 MG PO TBEC: 81.0000 mg | DELAYED_RELEASE_TABLET | Freq: Every day | ORAL | Status: DC

## 2017-10-19 MED ORDER — ASPIRIN 81 MG PO TBEC: 81.0000 mg | DELAYED_RELEASE_TABLET | Freq: Every day | ORAL | 0 refills | Status: DC

## 2017-10-19 MED ORDER — ATORVASTATIN CALCIUM 40 MG PO TABS: 40.0000 mg | ORAL_TABLET | Freq: Every day | ORAL | 0 refills | Status: DC

## 2017-10-19 NOTE — Discharge Summary (Signed)
[facility] NAME: Angel Brandt    MR#:  [id number]  DATE OF BIRTH:  Roberge 07, 1925  DATE OF ADMISSION:  10/17/2017 ADMITTING PHYSICIAN: Alexis Goodell, MD  DATE OF DISCHARGE: 10/19/2017 12:50 PM  PRIMARY CARE PHYSICIAN: Maryland Pink, MD    ADMISSION DIAGNOSIS:  TIA (transient ischemic attack) [G45.9] Hyponatremia [E87.1] Chest pain, unspecified type [R07.9] Non-intractable vomiting with nausea, unspecified vomiting type [R11.2]  DISCHARGE DIAGNOSIS:  Active Problems:   TIA (transient ischemic attack)   SECONDARY DIAGNOSIS:   Past Medical History:  Diagnosis Date  . At high risk for falls   . Blind   . Breast cancer ([place])   . Cancer ([place])   . GERD (gastroesophageal reflux disease)   . Hearing impaired   . Macular degeneration   . Malaria   . Osteoporosis     HOSPITAL COURSE:   1.  Possible TIA with carotid stenosis.  The patient did receive aspirin here in the hospital without a reaction.  Will prescribe aspirin 81 mg daily and atorvastatin.  Unable to totally rule out stroke with her presentation. 2.  Unsteady gait.  This has improved today.  Physical therapy recommended home with home health. 3.  Nausea vomiting has resolved 4.  Dementia with behavioral disturbance on Seroquel and Remeron at night 5.  History of breast cancer 6.  Hyponatremia.  Sodium seems to be always on the lower side.  Did come up to 130.  Can follow-up as outpatient. 7.  Right sacroiliitis.  Aspirin should help.  I do not want to prescribe steroids and family does not want steroids for this either.   DISCHARGE CONDITIONS:   Satisfactory  CONSULTS OBTAINED:  Treatment Team:  Catarina Hartshorn, MD  DRUG ALLERGIES:   Allergies  Allergen Reactions  . Codeine Anaphylaxis  . Penicillins Anaphylaxis and Nausea And Vomiting  . Aspirin Other (See Comments) and Nausea And Vomiting    unknown  . Codeine Sulfate Nausea And Vomiting  . Other  Other (See Comments)  . Sulfa Antibiotics Other (See Comments)    unknown    DISCHARGE MEDICATIONS:   Allergies as of 10/19/2017      Reactions   Codeine Anaphylaxis   Penicillins Anaphylaxis, Nausea And Vomiting   Aspirin Other (See Comments), Nausea And Vomiting   unknown   Codeine Sulfate Nausea And Vomiting   Other Other (See Comments)   Sulfa Antibiotics Other (See Comments)   unknown      Medication List    STOP taking these medications   letrozole 2.5 MG tablet Commonly known as:  FEMARA   traMADol 50 MG tablet Commonly known as:  ULTRAM     TAKE these medications   acetaminophen 500 MG tablet Commonly known as:  TYLENOL Take 1,000 mg by mouth every 6 (six) hours as needed for mild pain.   aspirin 81 MG EC tablet Take 1 tablet (81 mg total) by mouth daily. Start taking on:  10/20/2017   atorvastatin 40 MG tablet Commonly known as:  LIPITOR Take 1 tablet (40 mg total) by mouth daily at 6 PM. Start taking on:  10/20/2017   mirtazapine 45 MG tablet Commonly known as:  REMERON Take 45 mg by mouth at bedtime.   omeprazole 20 MG capsule Commonly known as:  PRILOSEC Take 20 mg by mouth 2 (two) times daily before a meal.   QUEtiapine 25 MG tablet Commonly known as:  SEROQUEL Take 25 mg by mouth at  bedtime.        DISCHARGE INSTRUCTIONS:   Follow-up PMD 1 week  If you experience worsening of your admission symptoms, develop shortness of breath, life threatening emergency, suicidal or homicidal thoughts you must seek medical attention immediately by calling 911 or calling your MD immediately  if symptoms less severe.  You Must read complete instructions/literature along with all the possible adverse reactions/side effects for all the Medicines you take and that have been prescribed to you. Take any new Medicines after you have completely understood and accept all the possible adverse reactions/side effects.   Please note  You were cared for by a  hospitalist during your hospital stay. If you have any questions about your discharge medications or the care you received while you were in the hospital after you are discharged, you can call the unit and asked to speak with the hospitalist on call if the hospitalist that took care of you is not available. Once you are discharged, your primary care physician will handle any further medical issues. Please note that NO REFILLS for any discharge medications will be authorized once you are discharged, as it is imperative that you return to your primary care physician (or establish a relationship with a primary care physician if you do not have one) for your aftercare needs so that they can reassess your need for medications and monitor your lab values.    Today   CHIEF COMPLAINT:   Chief Complaint  Patient presents with  . Emesis    HISTORY OF PRESENT ILLNESS:  Angel Brandt  is a 83 y.o. female presented with vomiting and slurred speech   VITAL SIGNS:  Blood pressure (!) 153/42, pulse 60, temperature (!) 97.1 F (36.2 C), temperature source Axillary, resp. rate 15, height 5\' 5"  (1.651 m), weight 63.5 kg (140 lb), SpO2 99 %.    PHYSICAL EXAMINATION:  GENERAL:  83 y.o.-year-old patient lying in the bed with no acute distress.  EYES: Pupils equal, round, reactive to light and accommodation. No scleral icterus. Extraocular muscles intact.  HEENT: Head atraumatic, normocephalic. Oropharynx and nasopharynx clear.  NECK:  Supple, no jugular venous distention. No thyroid enlargement, no tenderness.  LUNGS: Normal breath sounds bilaterally, no wheezing, rales,rhonchi or crepitation. No use of accessory muscles of respiration.  CARDIOVASCULAR: S1, S2 normal. No murmurs, rubs, or gallops.  ABDOMEN: Soft, non-tender, non-distended. Bowel sounds present. No organomegaly or mass.  EXTREMITIES: No pedal edema, cyanosis, or clubbing.  Pain palpating the right sacral iliac area NEUROLOGIC: Patient moves  all of her extremities and able to sit up on her own. PSYCHIATRIC: The patient is alert.  SKIN: No obvious rash, lesion, or ulcer.   DATA REVIEW:   CBC Recent Labs  Lab 10/17/17 2300  WBC 9.3  HGB 11.3*  HCT 31.7*  PLT 217    Chemistries  Recent Labs  Lab 10/17/17 2300  10/19/17 0600  NA 127*   < > 130*  K 4.1   < > 4.2  CL 94*   < > 100*  CO2 26   < > 24  GLUCOSE 126*   < > 84  BUN 16   < > 12  CREATININE 0.76   < > 0.78  CALCIUM 9.3   < > 8.7*  AST 27  --   --   ALT 11*  --   --   ALKPHOS 91  --   --   BILITOT 0.4  --   --    < > =  values in this interval not displayed.    Cardiac Enzymes Recent Labs  Lab 10/18/17 1451  TROPONINI <0.03      RADIOLOGY:  Ct Head Wo Contrast  Result Date: 10/18/2017 CLINICAL DATA:  Acute onset of vomiting. Garbled speech. Balance issues and dizziness. EXAM: CT HEAD WITHOUT CONTRAST TECHNIQUE: Contiguous axial images were obtained from the base of the skull through the vertex without intravenous contrast. COMPARISON:  CT of the head performed 08/21/2014 FINDINGS: Brain: No evidence of acute infarction, hemorrhage, hydrocephalus, extra-axial collection or mass lesion/mass effect. Prominence of the ventricles and sulci reflects moderate cortical volume loss. Mild cerebellar atrophy is noted. Scattered periventricular and subcortical white matter change likely reflects small vessel ischemic microangiopathy. The brainstem and fourth ventricle are within normal limits. The basal ganglia are unremarkable in appearance. The cerebral hemispheres demonstrate grossly normal gray-white differentiation. No mass effect or midline shift is seen. Vascular: No hyperdense vessel or unexpected calcification. Skull: There is no evidence of fracture; visualized osseous structures are unremarkable in appearance. Sinuses/Orbits: The visualized portions of the orbits are within normal limits. There is partial opacification of the right mastoid air cells. The  paranasal sinuses and left mastoid air cells are well-aerated. Other: No significant soft tissue abnormalities are seen. IMPRESSION: 1. No acute intracranial pathology seen on CT. 2. Moderate cortical volume loss and scattered small vessel ischemic microangiopathy. 3. Partial opacification of the right mastoid air cells. Electronically Signed   By: Garald Balding M.D.   On: 10/18/2017 00:09   Mr Brain Wo Contrast  Result Date: 10/18/2017 CLINICAL DATA:  TIA.  Dementia and breast cancer. EXAM: MRI HEAD WITHOUT CONTRAST TECHNIQUE: Multiplanar, multiecho pulse sequences of the brain and surrounding structures were obtained without intravenous contrast. COMPARISON:  Head CT from yesterday FINDINGS: Advanced motion degradation. Axial diffusion imaging is reasonably diagnostic and negative for infarct. No hydrocephalus, shift, or visible collection. There is a small incidental lipoma posterior to the tectum. Atrophy with ventriculomegaly. Mild periventricular chronic small vessel ischemic type change. IMPRESSION: 1. Severe motion degradation. 2. Diffusion imaging is diagnostic and negative for acute infarct. Electronically Signed   By: Monte Fantasia M.D.   On: 10/18/2017 13:10   US Carotid Bilateral (at [facility] Only)  Result Date: 10/18/2017 CLINICAL DATA:  CVA.  History of smokeless tobacco use. EXAM: BILATERAL CAROTID DUPLEX ULTRASOUND TECHNIQUE: Pearline Cables scale imaging, color Doppler and duplex ultrasound were performed of bilateral carotid and vertebral arteries in the neck. COMPARISON:  None. FINDINGS: Criteria: Quantification of carotid stenosis is based on velocity parameters that correlate the residual internal carotid diameter with NASCET-based stenosis levels, using the diameter of the distal internal carotid lumen as the denominator for stenosis measurement. The following velocity measurements were obtained: RIGHT ICA:  100/21 cm/sec CCA:  34/74 cm/sec SYSTOLIC ICA/CCA RATIO:  1.2 DIASTOLIC ICA/CCA  RATIO:  2.1 ECA:  100 cm/sec LEFT ICA:  128/21 cm/sec CCA:  25/95 cm/sec SYSTOLIC ICA/CCA RATIO:  1.6 DIASTOLIC ICA/CCA RATIO:  1.9 ECA:  90 a cm/sec RIGHT CAROTID ARTERY: There is a moderate amount of atherosclerotic plaque within the right carotid bulb (image 15), extending to involve the origin and proximal aspects of the right internal carotid artery (image 22), not resulting in elevated peak systolic velocities within the interrogated course the right internal carotid artery to suggest a hemodynamically significant stenosis. Borderline elevated peak systolic velocity within distal aspect the right internal carotid artery is felt to be factitiously elevated due to sampling at a location of  turbulent flow. RIGHT VERTEBRAL ARTERY:  Antegrade Flow LEFT CAROTID ARTERY: There is a moderate amount of atherosclerotic plaque within the left carotid bulb (image 43), extending to involve the origin and proximal aspect the left internal carotid artery (image 54) resulting in borderline elevated peak systolic velocities within the mid aspect the left internal carotid artery. Greatest acquired peak systolic velocity within mid left ICA measures 128 cm/sec - image 59. LEFT VERTEBRAL ARTERY:  Antegrade flow IMPRESSION: 1. Moderate amount of left-sided atherosclerotic plaque results in elevated peak systolic velocity compatible with the lower end of the 50-69% luminal narrowing range. Further evaluation with CTA could be performed as clinically indicated. 2. Moderate amount of right-sided atherosclerotic plaque, not definitely resulting in a hemodynamically significant stenosis. Electronically Signed   By: Sandi Mariscal M.D.   On: 10/18/2017 08:51   Dg Chest Port 1 View  Result Date: 10/17/2017 CLINICAL DATA:  Nausea and vomiting EXAM: PORTABLE CHEST 1 VIEW COMPARISON:  None. FINDINGS: Minimal atelectasis at the left base. No focal consolidation. Normal heart size. Aortic atherosclerosis. No pneumothorax. IMPRESSION: Minimal  atelectasis or scar at the left base. Negative for edema or infiltrate. Electronically Signed   By: Donavan Foil M.D.   On: 10/17/2017 23:43      Management plans discussed with the patient, family and they are in agreement.  CODE STATUS:  Code Status History    Date Active Date Inactive Code Status Order ID Comments User Context   10/18/2017 03:42 10/19/2017 16:20 Full Code [id number]  Saundra Shelling, MD Inpatient    Advance Directive Documentation     Most Recent Value  Type of Advance Directive  Healthcare Power of Attorney [Melinda Holmes]  Pre-existing out of facility DNR order (yellow form or pink MOST form)  No data  "MOST" Form in Place?  No data      TOTAL TIME TAKING CARE OF THIS PATIENT: 35 minutes.    Loletha Grayer M.D on 10/19/2017 at 5:40 PM  Between 7am to 6pm - Pager - [phone]  After 6pm go to [email or web address] - password [email or web address] [facility]  [phone]  CC: Primary care physician; [place] Pink, MD

## 2017-10-19 NOTE — Plan of Care (Signed)
Pt has been lethargic most of the shift. Pt was unable to follow commands when assessed. Pt has been incontinent this shift. Pt was also agitated this shift. No signs of distress noted. Will continue to monitor.

## 2017-10-19 NOTE — Progress Notes (Addendum)
Subjective: Patient resting today but easily aroused.  No new neurological complaints.    Objective: Current vital signs: BP (!) 153/42 (BP Location: Left Arm)   Pulse 60   Temp (!) 97.1 F (36.2 C) (Axillary)   Resp 15   Ht 5\' 5"  (1.651 m)   Wt 63.5 kg (140 lb)   SpO2 99%   BMI 23.30 kg/m  Vital signs in last 24 hours: Temp:  [96.4 F (35.8 C)-98.7 F (37.1 C)] 97.1 F (36.2 C) (12/27 0411) Pulse Rate:  [60-73] 60 (12/27 0411) Resp:  [15-18] 15 (12/27 0411) BP: (123-153)/(38-80) 153/42 (12/27 0411) SpO2:  [93 %-100 %] 99 % (12/27 0411)  Intake/Output from previous day: 12/26 0701 - 12/27 0700 In: 120 [P.O.:120] Out: -  Intake/Output this shift: Total I/O In: 120 [P.O.:120] Out: -  Nutritional status: DIET DYS 3 Room service appropriate? Yes with Assist; Fluid consistency: Thin  Neurologic Exam: Mental Status: Easily aroused.  Speech fluent without evidence of aphasia.  Able to follow 3 step commands with some mild reinforcement. Cranial Nerves: II: Discs flat bilaterally; Visual fields grossly normal III,IV, VI: ptosis not present, extra-ocular motions intact bilaterally V,VII: smile symmetric, facial light touch sensation normal bilaterally VIII: hard of hearing IX,X: gag reflex present XI: bilateral shoulder shrug XII: midline tongue extension Motor: Moves all extremities against gravity   Lab Results: Basic Metabolic Panel: Recent Labs  Lab 10/17/17 2300 10/18/17 1451 10/19/17 0600  NA 127* 130* 130*  K 4.1 4.1 4.2  CL 94* 98* 100*  CO2 26 25 24   GLUCOSE 126* 94 84  BUN 16 14 12   CREATININE 0.76 0.89 0.78  CALCIUM 9.3 9.1 8.7*    Liver Function Tests: Recent Labs  Lab 10/17/17 2300  AST 27  ALT 11*  ALKPHOS 91  BILITOT 0.4  PROT 7.4  ALBUMIN 4.2   Recent Labs  Lab 10/17/17 2300  LIPASE 40   No results for input(s): AMMONIA in the last 168 hours.  CBC: Recent Labs  Lab 10/17/17 2300  WBC 9.3  NEUTROABS 7.9*  HGB 11.3*  HCT  31.7*  MCV 89.3  PLT 217    Cardiac Enzymes: Recent Labs  Lab 10/17/17 2300 10/18/17 0405 10/18/17 0927 10/18/17 1451  TROPONINI <0.03 <0.03 <0.03 <0.03    Lipid Panel: Recent Labs  Lab 10/18/17 0405  CHOL 158  TRIG 39  HDL 70  CHOLHDL 2.3  VLDL 8  LDLCALC 80    CBG: No results for input(s): GLUCAP in the last 168 hours.  Microbiology: No results found for this or any previous visit.  Coagulation Studies: No results for input(s): LABPROT, INR in the last 72 hours.  Imaging: Ct Head Wo Contrast  Result Date: 10/18/2017 CLINICAL DATA:  Acute onset of vomiting. Garbled speech. Balance issues and dizziness. EXAM: CT HEAD WITHOUT CONTRAST TECHNIQUE: Contiguous axial images were obtained from the base of the skull through the vertex without intravenous contrast. COMPARISON:  CT of the head performed 08/21/2014 FINDINGS: Brain: No evidence of acute infarction, hemorrhage, hydrocephalus, extra-axial collection or mass lesion/mass effect. Prominence of the ventricles and sulci reflects moderate cortical volume loss. Mild cerebellar atrophy is noted. Scattered periventricular and subcortical white matter change likely reflects small vessel ischemic microangiopathy. The brainstem and fourth ventricle are within normal limits. The basal ganglia are unremarkable in appearance. The cerebral hemispheres demonstrate grossly normal gray-white differentiation. No mass effect or midline shift is seen. Vascular: No hyperdense vessel or unexpected calcification. Skull: There is no  evidence of fracture; visualized osseous structures are unremarkable in appearance. Sinuses/Orbits: The visualized portions of the orbits are within normal limits. There is partial opacification of the right mastoid air cells. The paranasal sinuses and left mastoid air cells are well-aerated. Other: No significant soft tissue abnormalities are seen. IMPRESSION: 1. No acute intracranial pathology seen on CT. 2. Moderate  cortical volume loss and scattered small vessel ischemic microangiopathy. 3. Partial opacification of the right mastoid air cells. Electronically Signed   By: Garald Balding M.D.   On: 10/18/2017 00:09   Mr Brain Wo Contrast  Result Date: 10/18/2017 CLINICAL DATA:  TIA.  Dementia and breast cancer. EXAM: MRI HEAD WITHOUT CONTRAST TECHNIQUE: Multiplanar, multiecho pulse sequences of the brain and surrounding structures were obtained without intravenous contrast. COMPARISON:  Head CT from yesterday FINDINGS: Advanced motion degradation. Axial diffusion imaging is reasonably diagnostic and negative for infarct. No hydrocephalus, shift, or visible collection. There is a small incidental lipoma posterior to the tectum. Atrophy with ventriculomegaly. Mild periventricular chronic small vessel ischemic type change. IMPRESSION: 1. Severe motion degradation. 2. Diffusion imaging is diagnostic and negative for acute infarct. Electronically Signed   By: Monte Fantasia M.D.   On: 10/18/2017 13:10   US Carotid Bilateral (at [facility] And [facility] Only)  Result Date: 10/18/2017 CLINICAL DATA:  CVA.  History of smokeless tobacco use. EXAM: BILATERAL CAROTID DUPLEX ULTRASOUND TECHNIQUE: Pearline Cables scale imaging, color Doppler and duplex ultrasound were performed of bilateral carotid and vertebral arteries in the neck. COMPARISON:  None. FINDINGS: Criteria: Quantification of carotid stenosis is based on velocity parameters that correlate the residual internal carotid diameter with NASCET-based stenosis levels, using the diameter of the distal internal carotid lumen as the denominator for stenosis measurement. The following velocity measurements were obtained: RIGHT ICA:  100/21 cm/sec CCA:  96/29 cm/sec SYSTOLIC ICA/CCA RATIO:  1.2 DIASTOLIC ICA/CCA RATIO:  2.1 ECA:  100 cm/sec LEFT ICA:  128/21 cm/sec CCA:  52/84 cm/sec SYSTOLIC ICA/CCA RATIO:  1.6 DIASTOLIC ICA/CCA RATIO:  1.9 ECA:  90 a cm/sec RIGHT CAROTID ARTERY: There is a moderate  amount of atherosclerotic plaque within the right carotid bulb (image 15), extending to involve the origin and proximal aspects of the right internal carotid artery (image 22), not resulting in elevated peak systolic velocities within the interrogated course the right internal carotid artery to suggest a hemodynamically significant stenosis. Borderline elevated peak systolic velocity within distal aspect the right internal carotid artery is felt to be factitiously elevated due to sampling at a location of turbulent flow. RIGHT VERTEBRAL ARTERY:  Antegrade Flow LEFT CAROTID ARTERY: There is a moderate amount of atherosclerotic plaque within the left carotid bulb (image 43), extending to involve the origin and proximal aspect the left internal carotid artery (image 54) resulting in borderline elevated peak systolic velocities within the mid aspect the left internal carotid artery. Greatest acquired peak systolic velocity within mid left ICA measures 128 cm/sec - image 59. LEFT VERTEBRAL ARTERY:  Antegrade flow IMPRESSION: 1. Moderate amount of left-sided atherosclerotic plaque results in elevated peak systolic velocity compatible with the lower end of the 50-69% luminal narrowing range. Further evaluation with CTA could be performed as clinically indicated. 2. Moderate amount of right-sided atherosclerotic plaque, not definitely resulting in a hemodynamically significant stenosis. Electronically Signed   By: Sandi Mariscal M.D.   On: 10/18/2017 08:51   Dg Chest Port 1 View  Result Date: 10/17/2017 CLINICAL DATA:  Nausea and vomiting EXAM: PORTABLE CHEST 1 VIEW COMPARISON:  None. FINDINGS: Minimal atelectasis at the left base. No focal consolidation. Normal heart size. Aortic atherosclerosis. No pneumothorax. IMPRESSION: Minimal atelectasis or scar at the left base. Negative for edema or infiltrate. Electronically Signed   By: Donavan Foil M.D.   On: 10/17/2017 23:43    Medications:  I have reviewed the  patient's current medications. Scheduled: . [START ON 10/20/2017] aspirin EC  81 mg Oral Daily  . atorvastatin  40 mg Oral q1800  . mirtazapine  45 mg Oral QHS  . pantoprazole  40 mg Oral Daily  . QUEtiapine  25 mg Oral QHS    Assessment/Plan: Patient without new neurological complaints.  MRI of the brain reviewed and shows no acute changes.  Do not suspect stroke or TIA as cause for presentation but with no way to rule out TIA completely low dose ASA seems appropriate.  No further neurologic intervention is recommended at this time.  Patient to continue ASA 81mg  daily.  Thank you for allowing neurology to participate in the care of this patient.    LOS: 1 day   Alexis Goodell, MD Neurology [phone] 10/19/2017  12:28 PM

## 2017-10-19 NOTE — Progress Notes (Signed)
Pt is being discharged today, Iv x1 removed and all belongings were packed and returned to the patient. Discharge instructions were reviewed with the patient's daughter who is the POA/ legal guardian. She verified understanding, 2 paper prescriptions were given to her. She was rolled out in a wheelchair by staff.

## 2017-10-19 NOTE — Care Management Note (Signed)
Case Management Note  Patient Details  Name: Angel Brandt MRN: [id number] Date of Birth: 1923/12/27  Subjective/Objective:    Admitted to [facility] with the diagnosis of TIA. Daughter, Horton Chin lives in the home [phone]). Last seen Dr. Kary Kos 12/26/2016. Prescriptions are filled at [facility]. Home health per [place] about 1.5 years ago. No skilled facility. No home oxygen. [employer] Alert in the home. Rolling walker in the home. Self feed, self dress, and self baths. Last fall was [employer]. Fair appetite. Daughter will transport                Action/Plan:  Physical therapy recommending therapy in the home. [place]. Will update Floydene Flock, [facility] representative   Expected Discharge Date:  10/19/17               Expected Discharge Plan:     In-House Referral:   yes  Discharge planning Services     Post Acute Care Choice:   yes Choice offered to:   daughter  DME Arranged:    DME Agency:     HH Arranged:   yes HH Agency:   [facility]  Status of Service:     If discussed at [place] of Stay Meetings, dates discussed:    Additional Comments:  Shelbie Ammons, RN MSN CCM Care management [phone] 10/19/2017, 12:17 PM

## 2017-10-19 NOTE — Clinical Social Work Note (Signed)
Clinical Social Work Assessment  Patient Details  Name: Angel Brandt MRN: [id number] Date of Birth: 09/08/1924  Date of referral:  10/19/17               Reason for consult:  Facility Placement                Permission sought to share information with:  Chartered certified accountant granted to share information::  Yes, Verbal Permission Granted  Name::      [place]::   [place]   Relationship::     Contact Information:     Housing/Transportation Living arrangements for the past 2 months:  [place] of Information:  Power of [place], Adult Children Patient Interpreter Needed:  None Criminal Activity/Legal Involvement Pertinent to Current Situation/Hospitalization:  No - Comment as needed Significant Relationships:  Adult Children Lives with:  Adult Children Do you feel safe going back to the place where you live?  Yes Need for family participation in patient care:  Yes (Comment)  Care giving concerns:  Patient lives in [place] with her daughter/ [place] [phone].    Social Worker assessment / plan:  Holiday representative (CSW) reviewed chart and noted that PT is recommending SNF. CSW met with patient and her daughter Angel Brandt, grandson and friend were at bedside. Patient was asleep in the chair at bedside and did not participate in assessment. Per Angel Brandt she lives with patient in [place] and stays with her until she has to go to work. Per Angel Brandt she can arrange for 24/7 supervision for patient while she is at work. CSW explained SNF process and provided bed offers. Per Angel Brandt PT is going to work with patient again today to see if there is any way she can get home. Per nurse PT has already worked with patient and is recommending home health. Angel Brandt is agreeable to taking patient home today. RN case Freight forwarder, RN and MD aware of above. Please reconsult if future social work needs arise. CSW signing off.   Employment  status:  Disabled (Comment on whether or not currently receiving Disability), Retired Nurse, adult PT Recommendations:  Home with [employer], [place] / Referral to community resources:  Other (Comment Required)(Patient will D/C home with home health. )  Patient/Family's Response to care:  Patient's daughter Angel Brandt is agreeable to take patient home today.   Patient/Family's Understanding of and Emotional Response to Diagnosis, Current Treatment, and Prognosis:  Patient's daughter Angel Brandt was very pleasant and thanked CSW for visit.   Emotional Assessment Appearance:  Appears stated age Attitude/Demeanor/Rapport:  Unable to Assess Affect (typically observed):  Unable to Assess Orientation:  Oriented to Self, Oriented to Place, Oriented to  Time, Fluctuating Orientation (Suspected and/or reported Sundowners) Alcohol / Substance use:  Not Applicable Psych involvement (Current and /or in the community):  No (Comment)  Discharge Needs  Concerns to be addressed:  Discharge Planning Concerns Readmission within the last 30 days:  No Current discharge risk:  None Barriers to Discharge:  No Barriers Identified   Angel Brandt, Angel Beets, LCSW 10/19/2017, 12:20 PM

## 2017-10-19 NOTE — Progress Notes (Signed)
Physical Therapy Treatment Patient Details Name: Angel Brandt MRN: [id number] DOB: [birth year] Today's Date: 10/19/2017    History of Present Illness Angel Brandt  is a 83 y.o. female with a known history of known history of dementia, breast cancer, GERD, osteoporosis presented to the emergency room with nausea and vomiting.  Patient also had some garbled speech around 8:30 PM last night and dizziness.This was reported by patients daughter.  Patient has dementia and not able to give much history.  She was given aspirin by EMS and brought to the emergency room.  Patient was worked up with CT head which showed no acute intracranial pathology.  Her nausea improved in the emergency room.     PT Comments    Pt presented in bed sleeping, requiring increased time for arousal. Pt initially lethargic and required increased time following commands however improved throughout session and was able to appropriately answer questions and follow 1 step commands. Pt required decreased assistance with increased time for bed mobility. Pt continues to require 1-2 person assist for transfers and gait and continues to demonstrate intermittent mild LOB with decreased righting reactions. Pt ambulated 53ft with initially 2 person assist and was able to progress to 1 person assist. Given pt's extensive family support PT able to recommend d/c home with 24hr supervision/assistance.   Follow Up Recommendations        Equipment Recommendations  None recommended by PT    Recommendations for Other Services       Precautions / Restrictions Precautions Precautions: Fall Restrictions Weight Bearing Restrictions: No    Mobility  Bed Mobility Overal bed mobility: Needs Assistance Bed Mobility: Supine to Sit     Supine to sit: Min assist     General bed mobility comments: Required increased time and mina for truncal support during transitions  Transfers Overall transfer level: Needs assistance Equipment used: 1  person hand held assist Transfers: Sit to/from Stand Sit to Stand: Mod assist         General transfer comment: Pt required increased assistance to maintain upright standing, pt reaching for counter and table. Once stabilized pt continues to hold onto counter for stabilization  Ambulation/Gait Ambulation/Gait assistance: Min assist;+2 physical assistance Ambulation Distance (Feet): 45 Feet Assistive device: 2 person hand held assist Gait Pattern/deviations: Scissoring;Leaning posteriorly;Staggering left;Staggering right;Narrow base of support Gait velocity: decreased   General Gait Details: Pt gently holding 2 person hand held assist however continues to demonstrate poor stability staggering L/R and posterior   Stairs            Wheelchair Mobility    Modified Rankin (Stroke Patients Only)       Balance Overall balance assessment: Needs assistance Sitting-balance support: No upper extremity supported Sitting balance-Leahy Scale: Fair     Standing balance support: Bilateral upper extremity supported Standing balance-Leahy Scale: Poor Standing balance comment: Pt unable to maintain balance without UE support                            Cognition Arousal/Alertness: Awake/alert;Lethargic Behavior During Therapy: WFL for tasks assessed/performed Overall Cognitive Status: History of cognitive impairments - at baseline                                 General Comments: Initially lethargic as pt took increased time to arouse from sleeping. Pt became more alert and responsive throughout session  Exercises      General Comments        Pertinent Vitals/Pain Pain Assessment: No/denies pain    Home Living                      Prior Function            PT Goals (current goals can now be found in the care plan section) Acute Rehab PT Goals Patient Stated Goal: Return home to prior function PT Goal Formulation: With  family Time For Goal Achievement: 11/01/17 Potential to Achieve Goals: Fair    Frequency    Min 2X/week      PT Plan      Co-evaluation              AM-PAC PT "6 Clicks" Daily Activity  Outcome Measure  Difficulty turning over in bed (including adjusting bedclothes, sheets and blankets)?: Unable Difficulty moving from lying on back to sitting on the side of the bed? : Unable Difficulty sitting down on and standing up from a chair with arms (e.g., wheelchair, bedside commode, etc,.)?: Unable Help needed moving to and from a bed to chair (including a wheelchair)?: A Little Help needed walking in hospital room?: A Lot Help needed climbing 3-5 steps with a railing? : Total 6 Click Score: 9    End of Session Equipment Utilized During Treatment: Gait belt Activity Tolerance: Patient tolerated treatment well Patient left: in chair;with call bell/phone within reach;with chair alarm set   PT Visit Diagnosis: Unsteadiness on feet (R26.81)     Time: 6301-6010 PT Time Calculation (min) (ACUTE ONLY): 20 min  Charges:  $Therapeutic Activity: 8-22 mins                      Angel Brandt  Angel Brandt, PTA 10/19/2017, 9:34 AM

## 2019-04-29 ENCOUNTER — Encounter: Payer: Self-pay | Admitting: Emergency Medicine

## 2019-04-29 ENCOUNTER — Emergency Department: Payer: Medicare Other

## 2019-04-29 ENCOUNTER — Inpatient Hospital Stay
Admission: EM | Admit: 2019-04-29 | Discharge: 2019-05-03 | DRG: 480 | Disposition: A | Discharge status: Skilled Nursing Facility | Payer: Medicare Other | Attending: Internal Medicine | Admitting: Internal Medicine

## 2019-04-29 ENCOUNTER — Other Ambulatory Visit: Payer: Self-pay

## 2019-04-29 DIAGNOSIS — S72141A Displaced intertrochanteric fracture of right femur, initial encounter for closed fracture: Secondary | ICD-10-CM | POA: Diagnosis present

## 2019-04-29 DIAGNOSIS — Z853 Personal history of malignant neoplasm of breast: Secondary | ICD-10-CM

## 2019-04-29 DIAGNOSIS — S72009A Fracture of unspecified part of neck of unspecified femur, initial encounter for closed fracture: Secondary | ICD-10-CM

## 2019-04-29 DIAGNOSIS — F17290 Nicotine dependence, other tobacco product, uncomplicated: Secondary | ICD-10-CM | POA: Diagnosis present

## 2019-04-29 DIAGNOSIS — M810 Age-related osteoporosis without current pathological fracture: Secondary | ICD-10-CM | POA: Diagnosis present

## 2019-04-29 DIAGNOSIS — E43 Unspecified severe protein-calorie malnutrition: Secondary | ICD-10-CM | POA: Diagnosis present

## 2019-04-29 DIAGNOSIS — M25551 Pain in right hip: Secondary | ICD-10-CM | POA: Diagnosis present

## 2019-04-29 DIAGNOSIS — D62 Acute posthemorrhagic anemia: Secondary | ICD-10-CM | POA: Diagnosis not present

## 2019-04-29 DIAGNOSIS — Z419 Encounter for procedure for purposes other than remedying health state, unspecified: Secondary | ICD-10-CM

## 2019-04-29 DIAGNOSIS — H3530 Unspecified macular degeneration: Secondary | ICD-10-CM | POA: Diagnosis present

## 2019-04-29 DIAGNOSIS — Z7982 Long term (current) use of aspirin: Secondary | ICD-10-CM

## 2019-04-29 DIAGNOSIS — F0390 Unspecified dementia without behavioral disturbance: Secondary | ICD-10-CM | POA: Diagnosis present

## 2019-04-29 DIAGNOSIS — J849 Interstitial pulmonary disease, unspecified: Secondary | ICD-10-CM | POA: Diagnosis present

## 2019-04-29 DIAGNOSIS — E871 Hypo-osmolality and hyponatremia: Secondary | ICD-10-CM | POA: Diagnosis present

## 2019-04-29 DIAGNOSIS — W19XXXA Unspecified fall, initial encounter: Secondary | ICD-10-CM | POA: Diagnosis present

## 2019-04-29 DIAGNOSIS — H547 Unspecified visual loss: Secondary | ICD-10-CM | POA: Diagnosis present

## 2019-04-29 DIAGNOSIS — Z8673 Personal history of transient ischemic attack (TIA), and cerebral infarction without residual deficits: Secondary | ICD-10-CM | POA: Diagnosis not present

## 2019-04-29 DIAGNOSIS — Z6822 Body mass index (BMI) 22.0-22.9, adult: Secondary | ICD-10-CM | POA: Diagnosis not present

## 2019-04-29 DIAGNOSIS — H9190 Unspecified hearing loss, unspecified ear: Secondary | ICD-10-CM | POA: Diagnosis present

## 2019-04-29 DIAGNOSIS — K219 Gastro-esophageal reflux disease without esophagitis: Secondary | ICD-10-CM | POA: Diagnosis present

## 2019-04-29 DIAGNOSIS — Z1159 Encounter for screening for other viral diseases: Secondary | ICD-10-CM

## 2019-04-29 DIAGNOSIS — S72001A Fracture of unspecified part of neck of right femur, initial encounter for closed fracture: Secondary | ICD-10-CM | POA: Diagnosis present

## 2019-04-29 LAB — COMPREHENSIVE METABOLIC PANEL
ALT: 15 U/L (ref 0–44)
AST: 20 U/L (ref 15–41)
Albumin: 3.8 g/dL (ref 3.5–5.0)
Alkaline Phosphatase: 110 U/L (ref 38–126)
Anion gap: 10 (ref 5–15)
BUN: 14 mg/dL (ref 8–23)
CO2: 24 mmol/L (ref 22–32)
Calcium: 8.8 mg/dL — ABNORMAL LOW (ref 8.9–10.3)
Chloride: 94 mmol/L — ABNORMAL LOW (ref 98–111)
Creatinine, Ser: 0.75 mg/dL (ref 0.44–1.00)
GFR calc Af Amer: >60 mL/min (ref >60)
GFR calc non Af Amer: >60 mL/min (ref >60)
Glucose, Bld: 99 mg/dL (ref 70–99)
Potassium: 4.2 mmol/L (ref 3.5–5.1)
Sodium: 128 mmol/L — ABNORMAL LOW (ref 135–145)
Total Bilirubin: 0.5 mg/dL (ref 0.3–1.2)
Total Protein: 6.9 g/dL (ref 6.5–8.1)

## 2019-04-29 LAB — URINALYSIS, COMPLETE (UACMP) WITH MICROSCOPIC
Bilirubin Urine: NEGATIVE
Glucose, UA: NEGATIVE mg/dL
Ketones, ur: NEGATIVE mg/dL
Leukocytes,Ua: NEGATIVE
Nitrite: NEGATIVE
Protein, ur: NEGATIVE mg/dL
Specific Gravity, Urine: 1.004 — ABNORMAL LOW (ref 1.005–1.030)
pH: 7 (ref 5.0–8.0)

## 2019-04-29 LAB — CBC WITH DIFFERENTIAL/PLATELET
Abs Immature Granulocytes: 0.06 10*3/uL (ref 0.00–0.07)
Basophils Absolute: 0.1 10*3/uL (ref 0.0–0.1)
Basophils Relative: 1 %
Eosinophils Absolute: 0.2 10*3/uL (ref 0.0–0.5)
Eosinophils Relative: 2 %
HCT: 31.1 % — ABNORMAL LOW (ref 36.0–46.0)
Hemoglobin: 10.6 g/dL — ABNORMAL LOW (ref 12.0–15.0)
Immature Granulocytes: 1 %
Lymphocytes Relative: 14 %
Lymphs Abs: 1 10*3/uL (ref 0.7–4.0)
MCH: 30.1 pg (ref 26.0–34.0)
MCHC: 34.1 g/dL (ref 30.0–36.0)
MCV: 88.4 fL (ref 80.0–100.0)
Monocytes Absolute: 0.5 10*3/uL (ref 0.1–1.0)
Monocytes Relative: 7 %
Neutro Abs: 5.6 10*3/uL (ref 1.7–7.7)
Neutrophils Relative %: 75 %
Platelets: 174 10*3/uL (ref 150–400)
RBC: 3.52 MIL/uL — ABNORMAL LOW (ref 3.87–5.11)
RDW: 12.7 % (ref 11.5–15.5)
WBC: 7.4 10*3/uL (ref 4.0–10.5)
nRBC: 0 % (ref 0.0–0.2)

## 2019-04-29 LAB — GLUCOSE, CAPILLARY: Glucose-Capillary: 87 mg/dL (ref 70–99)

## 2019-04-29 MED ORDER — HYDROCODONE-ACETAMINOPHEN 5-325 MG PO TABS: 1.0000 | ORAL_TABLET | Freq: Four times a day (QID) | ORAL | Status: DC | PRN

## 2019-04-29 MED ORDER — PANTOPRAZOLE SODIUM 40 MG PO TBEC
40.0000 mg | DELAYED_RELEASE_TABLET | Freq: Every day | ORAL | Status: DC
  Administered 2019-05-01 – 2019-05-03 (×3): 40 mg via ORAL
  Filled 2019-04-29 (×3): qty 1

## 2019-04-29 MED ORDER — MORPHINE SULFATE (PF) 2 MG/ML IV SOLN
0.5000 mg | INTRAVENOUS | Status: DC | PRN
  Administered 2019-04-30: 0.5 mg via INTRAVENOUS
  Filled 2019-04-29: qty 1

## 2019-04-29 MED ORDER — MIRTAZAPINE 15 MG PO TABS
45.0000 mg | ORAL_TABLET | Freq: Every day | ORAL | Status: DC
  Administered 2019-04-30 – 2019-05-02 (×4): 45 mg via ORAL
  Filled 2019-04-29 (×4): qty 3

## 2019-04-29 MED ORDER — QUETIAPINE FUMARATE 25 MG PO TABS
25.0000 mg | ORAL_TABLET | Freq: Every day | ORAL | Status: DC
  Administered 2019-04-30 – 2019-05-02 (×4): 25 mg via ORAL
  Filled 2019-04-29 (×4): qty 1

## 2019-04-29 MED ORDER — POLYETHYLENE GLYCOL 3350 17 G PO PACK: 17.0000 g | PACK | Freq: Every day | ORAL | Status: DC | PRN

## 2019-04-29 MED ORDER — MORPHINE SULFATE (PF) 2 MG/ML IV SOLN: 2.0000 mg | INTRAVENOUS | Status: DC

## 2019-04-29 MED ORDER — MORPHINE SULFATE (PF) 4 MG/ML IV SOLN
4.0000 mg | Freq: Once | INTRAVENOUS | Status: AC
  Administered 2019-04-29: 4 mg via INTRAVENOUS
  Filled 2019-04-29: qty 1

## 2019-04-29 NOTE — H&P (Signed)
[name]    MR#:  [id number]  DATE OF BIRTH:  May 23, 1924  DATE OF ADMISSION:  04/29/2019  PRIMARY CARE PHYSICIAN: Maryland Pink, MD   REQUESTING/REFERRING PHYSICIAN: Lenise Arena, MD  CHIEF COMPLAINT:   Chief Complaint  Patient presents with  . Fall    HISTORY OF PRESENT ILLNESS:  Angel Brandt  is a 85 y.o. female with a known history of osteoporosis, macular degeneration, GERD, breast cancer, history of TIA in 2018.  Patient presented to the emergency room via EMS services after sustaining an unwitnessed fall on her front porch which is a concrete surface occurred around 9 PM.  She presented with complaint of right hip pain.  She denies hitting her head or loss of consciousness.  She denies chest pain, shortness of breath, abdominal pain.  CT head demonstrated no acute intracranial abnormalities.  Chest x-ray demonstrated interstitial lung disease which have progressed from the prior exam.  Right hip x-ray demonstrated displaced mildly comminuted intertrochanteric right femur fracture.  Dr. Lisette Grinder consulted by the ED physician.  Patient is being held n.p.o. for surgery in the a.m. She has been admitted by the hospitalist service for further management    PAST MEDICAL HISTORY:   Past Medical History:  Diagnosis Date  . At high risk for falls   . Blind   . Breast cancer ([place])   . Cancer ([place])   . Sherran Needs syndrome 2017  . GERD (gastroesophageal reflux disease)   . Hearing impaired   . Macular degeneration   . Malaria   . Osteoporosis     PAST SURGICAL HISTORY:   Past Surgical History:  Procedure Laterality Date  . BREAST LUMPECTOMY    . BREAST SURGERY      SOCIAL HISTORY:   Social History   Tobacco Use  . Smoking status: Never Smoker  . Smokeless tobacco: Current User    Types: Snuff  Substance Use Topics  . Alcohol use: No    FAMILY HISTORY:  History reviewed. No  pertinent family history.  DRUG ALLERGIES:   Allergies  Allergen Reactions  . Codeine Anaphylaxis  . Penicillins Anaphylaxis and Nausea And Vomiting  . Ativan [Lorazepam] Other (See Comments)    Agitation  . Codeine Sulfate Nausea And Vomiting  . Other Other (See Comments)  . Sulfa Antibiotics Other (See Comments)    unknown    REVIEW OF SYSTEMS:   Review of Systems  Constitutional: Negative for chills, fever and malaise/fatigue.  HENT: Negative for congestion, sinus pain and sore throat.   Eyes: Negative for blurred vision and double vision.  Respiratory: Negative for cough, sputum production, shortness of breath and wheezing.   Cardiovascular: Negative for chest pain, palpitations and leg swelling.  Gastrointestinal: Negative for abdominal pain, constipation, diarrhea, heartburn, nausea and vomiting.  Genitourinary: Negative for dysuria, flank pain and hematuria.  Musculoskeletal: Positive for falls and joint pain (Right hip).  Neurological: Negative for dizziness, tingling, tremors, loss of consciousness, weakness and headaches.  Psychiatric/Behavioral: Negative.  Negative for depression.      MEDICATIONS AT HOME:   Prior to Admission medications   Medication Sig Start Date End Date Taking? Authorizing Provider  acetaminophen (TYLENOL) 500 MG tablet Take 1,000 mg by mouth every 6 (six) hours as needed for mild pain.   Yes [provider]  aspirin EC 81 MG EC tablet Take 1 tablet (81 mg total) by mouth daily. 10/20/17  Yes  Loletha Grayer, MD  mirtazapine (REMERON) 45 MG tablet Take 45 mg by mouth at bedtime.  05/27/15  Yes [provider]  omeprazole (PRILOSEC) 20 MG capsule Take 20 mg by mouth 2 (two) times daily before a meal.  12/23/15  Yes [provider]  QUEtiapine (SEROQUEL) 25 MG tablet Take 25 mg by mouth at bedtime.   Yes [provider]      VITAL SIGNS:  Blood pressure (!) 172/58, pulse 77, temperature 98.2 F (36.8 C),  temperature source Oral, resp. rate 18, height 5\' 1"  (1.549 m), weight 54.3 kg, SpO2 99 %.  PHYSICAL EXAMINATION:  Physical Exam  GENERAL:  85 y.o.-year-old patient lying in the bed with no acute distress.  EYES: Pupils equal, round, reactive to light and accommodation. No scleral icterus. Extraocular muscles intact.  HEENT: Head atraumatic, normocephalic. Oropharynx and nasopharynx clear.  NECK:  Supple, no jugular venous distention. No thyroid enlargement, no tenderness.  LUNGS: Normal breath sounds bilaterally, no wheezing, rales,rhonchi or crepitation. No use of accessory muscles of respiration.  CARDIOVASCULAR: Regular rate and rhythm, S1, S2 normal. No murmurs, rubs, or gallops.  2+ pulses to all extremities ABDOMEN: Soft, nondistended, nontender. Bowel sounds present. No organomegaly or mass.  EXTREMITIES: No pedal edema, cyanosis, or clubbing.  Pain with range of motion of right lower extremity.  Tenderness on palpation of right hip NEUROLOGIC: Cranial nerves II through XII are intact. Muscle strength 5/5 in all extremities. Sensation intact. Gait not checked.  PSYCHIATRIC: The patient is alert and oriented x 3.  Normal affect and good eye contact. SKIN: No obvious rash, lesion, or ulcer.   LABORATORY PANEL:   CBC Recent Labs  Lab 04/29/19 2204  WBC 7.4  HGB 10.6*  HCT 31.1*  PLT 174   ------------------------------------------------------------------------------------------------------------------  Chemistries  Recent Labs  Lab 04/29/19 2204  NA 128*  K 4.2  CL 94*  CO2 24  GLUCOSE 99  BUN 14  CREATININE 0.75  CALCIUM 8.8*  AST 20  ALT 15  ALKPHOS 110  BILITOT 0.5   ------------------------------------------------------------------------------------------------------------------  Cardiac Enzymes No results for input(s): TROPONINI in the last 168 hours.  ------------------------------------------------------------------------------------------------------------------  RADIOLOGY:  Dg Chest 1 View  Result Date: 04/29/2019 CLINICAL DATA:  Right hip fracture. Post fall. EXAM: CHEST  1 VIEW COMPARISON:  Chest radiograph 10/17/2017 FINDINGS: Diffuse interstitial coarsening, prominent in the upper lung zones peripherally. Normal heart size and mediastinal contours. Aortic atherosclerosis. No confluent airspace disease, large pleural effusion or pneumothorax. No acute osseous abnormalities are seen. IMPRESSION: 1. Interstitial coarsening, most prominent in the periphery of the upper lung zones. Findings suggest interstitial lung disease, and have progressed from prior exam. 2.  Aortic Atherosclerosis (ICD10-I70.0). Electronically Signed   By: Keith Rake M.D.   On: 04/29/2019 22:48   Ct Head Wo Contrast  Result Date: 04/29/2019 CLINICAL DATA:  Altered level of consciousness. Unwitnessed fall. EXAM: CT HEAD WITHOUT CONTRAST TECHNIQUE: Contiguous axial images were obtained from the base of the skull through the vertex without intravenous contrast. COMPARISON:  Head CT 10/17/2017 FINDINGS: Brain: No intracranial hemorrhage, mass effect, or midline shift. Generalized atrophy and chronic small vessel ischemia, stable from prior exam. Punctate remote lacunar infarct in left basal ganglia. No hydrocephalus. The basilar cisterns are patent. No evidence of territorial infarct or acute ischemia. Tiny incidental lipoma posterior to the pons. No extra-axial or intracranial fluid collection. Vascular: Atherosclerosis of skullbase vasculature without hyperdense vessel or abnormal calcification. Skull: No fracture or focal lesion. Sinuses/Orbits: Paranasal  sinuses are clear. Chronic opacification of lower right mastoid air cells. The visualized orbits are unremarkable. Bilateral cataract resection. Other: None. IMPRESSION: 1. No acute intracranial abnormality. 2. Stable  atrophy and chronic small vessel ischemia. Electronically Signed   By: Keith Rake M.D.   On: 04/29/2019 22:36   Dg Hip Unilat W Or Wo Pelvis 2-3 Views Right  Result Date: 04/29/2019 CLINICAL DATA:  Post fall with right hip pain. EXAM: DG HIP (WITH OR WITHOUT PELVIS) 2-3V RIGHT COMPARISON:  None. FINDINGS: Displaced mildly comminuted intertrochanteric right proximal femur fracture. Mild apex lateral angulation. Proximal migration of the femoral shaft. No additional fracture of the pelvis. Pubic rami are intact. Pubic symphysis and sacroiliac joints are congruent. IMPRESSION: Displaced mildly comminuted intertrochanteric right femur fracture. Electronically Signed   By: Keith Rake M.D.   On: 04/29/2019 22:46      IMPRESSION AND PLAN:   1.  Right hip fracture - Dr. Lisette Grinder consulted for further management with likely surgical intervention in the a.m. - Pain is being managed with IV analgesic -Patient is n.p.o. after midnight -Repeat CBC and BMP in the a.m.  2.  Acute right lower extremity pain - We will manage with IV analgesic  3.  GERD - IV PPI prophylaxis  DVT prophylaxis with SCDs and PPI prophylaxis initiated    All the records are reviewed and case discussed with ED provider. The plan of care was discussed in details with the patient (and family). I answered all questions. The patient agreed to proceed with the above mentioned plan. Further management will depend upon hospital course.   CODE STATUS: Full code  TOTAL TIME TAKING CARE OF THIS PATIENT: 45 minutes.    Theo Dills Angel Brandt CRNPon 04/29/2019 at 11:06 PM  Pager - [phone]  After 6pm go to [email or web address] - Proofreader  [facility]  Office  [phone]  CC: Primary care physician; Maryland Pink, MD   Note: This dictation was prepared with Dragon dictation along with smaller phrase technology. Any transcriptional errors that result from this process are  unintentional.

## 2019-04-29 NOTE — ED Notes (Signed)
Patient transported to CT 

## 2019-04-29 NOTE — ED Triage Notes (Signed)
Patient has an unwitnessed fall approximately 1 hour ago. Patient had a mini stroke christmas 2018. Patient has a history of an unstable gait.

## 2019-04-29 NOTE — ED Notes (Signed)
ED TO INPATIENT HANDOFF REPORT  ED Nurse Name and Phone #:  [phone]  S Name/Age/Gender Angel Brandt 85 y.o. female Room/Bed: ED14A/ED14A  Code Status   Code Status: Full Code  Home/SNF/Other Home Patient oriented to: self Is this baseline? Yes   Triage Complete: Triage complete  Chief Complaint Fall  Triage Note Patient has an unwitnessed fall approximately 1 hour ago. Patient had a mini stroke christmas 2018. Patient has a history of an unstable gait.   Allergies Allergies  Allergen Reactions  . Codeine Anaphylaxis  . Penicillins Anaphylaxis and Nausea And Vomiting  . Ativan [Lorazepam] Other (See Comments)    Agitation  . Codeine Sulfate Nausea And Vomiting  . Other Other (See Comments)  . Sulfa Antibiotics Other (See Comments)    unknown    Level of Care/Admitting Diagnosis ED Disposition    ED Disposition Condition [facility]: [place] [[id number]]  Level of Care: Med-Surg [16]  Covid Evaluation: Asymptomatic Screening Protocol (No Symptoms)  Diagnosis: Closed right hip fracture, initial encounter [facility]) [[id number]]  Admitting Physician: Mayer Camel [[id number]]  Attending Physician: Mayer Camel [[id number]]  Estimated length of stay: past midnight tomorrow  Certification:: I certify this patient will need inpatient services for at least 2 midnights  PT Class (Do Not Modify): Inpatient [101]  PT Acc Code (Do Not Modify): Private [1]       B Medical/Surgery History Past Medical History:  Diagnosis Date  . At high risk for falls   . Blind   . Breast cancer ([place])   . Cancer ([place])   . Sherran Needs syndrome 2017  . GERD (gastroesophageal reflux disease)   . Hearing impaired   . Macular degeneration   . Malaria   . Osteoporosis    Past Surgical History:  Procedure Laterality Date  . BREAST LUMPECTOMY    . BREAST SURGERY       A IV Location/Drains/Wounds Patient Lines/Drains/Airways Status    Active Line/Drains/Airways    Name:   Placement date:   Placement time:   Site:   Days:   Peripheral IV 04/29/19 Right Forearm   04/29/19    2142    Forearm   less than 1   Peripheral IV 04/29/19 Left Forearm   04/29/19    2223    Forearm   less than 1          Intake/Output Last 24 hours No intake or output data in the 24 hours ending 04/29/19 2355  Labs/Imaging Results for orders placed or performed during the hospital encounter of 04/29/19 (from the past 48 hour(s))  CBC with Differential     Status: Abnormal   Collection Time: 04/29/19 10:04 PM  Result Value Ref Range   WBC 7.4 4.0 - 10.5 K/uL   RBC 3.52 (L) 3.87 - 5.11 MIL/uL   Hemoglobin 10.6 (L) 12.0 - 15.0 g/dL   HCT 31.1 (L) 36.0 - 46.0 %   MCV 88.4 80.0 - 100.0 fL   MCH 30.1 26.0 - 34.0 pg   MCHC 34.1 30.0 - 36.0 g/dL   RDW 12.7 11.5 - 15.5 %   Platelets 174 150 - 400 K/uL   nRBC 0.0 0.0 - 0.2 %   Neutrophils Relative % 75 %   Neutro Abs 5.6 1.7 - 7.7 K/uL   Lymphocytes Relative 14 %   Lymphs Abs 1.0 0.7 - 4.0 K/uL   Monocytes Relative 7 %  Monocytes Absolute 0.5 0.1 - 1.0 K/uL   Eosinophils Relative 2 %   Eosinophils Absolute 0.2 0.0 - 0.5 K/uL   Basophils Relative 1 %   Basophils Absolute 0.1 0.0 - 0.1 K/uL   Immature Granulocytes 1 %   Abs Immature Granulocytes 0.06 0.00 - 0.07 K/uL    Comment: Performed at [facility], [address] [place], [place] [address]  Comprehensive metabolic panel     Status: Abnormal   Collection Time: 04/29/19 10:04 PM  Result Value Ref Range   Sodium 128 (L) 135 - 145 mmol/L   Potassium 4.2 3.5 - 5.1 mmol/L   Chloride 94 (L) 98 - 111 mmol/L   CO2 24 22 - 32 mmol/L   Glucose, Bld 99 70 - 99 mg/dL   BUN 14 8 - 23 mg/dL   Creatinine, Ser 0.75 0.44 - 1.00 mg/dL   Calcium 8.8 (L) 8.9 - 10.3 mg/dL   Total Protein 6.9 6.5 - 8.1 g/dL   Albumin 3.8 3.5 - 5.0 g/dL   AST 20 15 - 41 U/L   ALT 15 0 - 44 U/L   Alkaline Phosphatase 110 38 - 126 U/L   Total Bilirubin  0.5 0.3 - 1.2 mg/dL   GFR calc non Af Amer >60 >60 mL/min   GFR calc Af Amer >60 >60 mL/min   Anion gap 10 5 - 15    Comment: Performed at [facility], [address] [place], [place] [address]  Urinalysis, Complete w Microscopic     Status: Abnormal   Collection Time: 04/29/19 10:04 PM  Result Value Ref Range   Color, Urine STRAW (A) YELLOW   APPearance CLEAR (A) CLEAR   Specific Gravity, Urine 1.004 (L) 1.005 - 1.030   pH 7.0 5.0 - 8.0   Glucose, UA NEGATIVE NEGATIVE mg/dL   Hgb urine dipstick SMALL (A) NEGATIVE   Bilirubin Urine NEGATIVE NEGATIVE   Ketones, ur NEGATIVE NEGATIVE mg/dL   Protein, ur NEGATIVE NEGATIVE mg/dL   Nitrite NEGATIVE NEGATIVE   Leukocytes,Ua NEGATIVE NEGATIVE   RBC / HPF 0-5 0 - 5 RBC/hpf   WBC, UA 0-5 0 - 5 WBC/hpf   Bacteria, UA RARE (A) NONE SEEN   Squamous Epithelial / LPF 0-5 0 - 5    Comment: Performed at [facility], [address] [place], [place] [address]  Glucose, capillary     Status: None   Collection Time: 04/29/19 10:50 PM  Result Value Ref Range   Glucose-Capillary 87 70 - 99 mg/dL   Dg Chest 1 View  Result Date: 04/29/2019 CLINICAL DATA:  Right hip fracture. Post fall. EXAM: CHEST  1 VIEW COMPARISON:  Chest radiograph 10/17/2017 FINDINGS: Diffuse interstitial coarsening, prominent in the upper lung zones peripherally. Normal heart size and mediastinal contours. Aortic atherosclerosis. No confluent airspace disease, large pleural effusion or pneumothorax. No acute osseous abnormalities are seen. IMPRESSION: 1. Interstitial coarsening, most prominent in the periphery of the upper lung zones. Findings suggest interstitial lung disease, and have progressed from prior exam. 2.  Aortic Atherosclerosis (ICD10-I70.0). Electronically Signed   By: Keith Rake M.D.   On: 04/29/2019 22:48   Ct Head Wo Contrast  Result Date: 04/29/2019 CLINICAL DATA:  Altered level of consciousness. Unwitnessed fall. EXAM: CT HEAD WITHOUT  CONTRAST TECHNIQUE: Contiguous axial images were obtained from the base of the skull through the vertex without intravenous contrast. COMPARISON:  Head CT 10/17/2017 FINDINGS: Brain: No intracranial hemorrhage, mass effect, or midline shift. Generalized  atrophy and chronic small vessel ischemia, stable from prior exam. Punctate remote lacunar infarct in left basal ganglia. No hydrocephalus. The basilar cisterns are patent. No evidence of territorial infarct or acute ischemia. Tiny incidental lipoma posterior to the pons. No extra-axial or intracranial fluid collection. Vascular: Atherosclerosis of skullbase vasculature without hyperdense vessel or abnormal calcification. Skull: No fracture or focal lesion. Sinuses/Orbits: Paranasal sinuses are clear. Chronic opacification of lower right mastoid air cells. The visualized orbits are unremarkable. Bilateral cataract resection. Other: None. IMPRESSION: 1. No acute intracranial abnormality. 2. Stable atrophy and chronic small vessel ischemia. Electronically Signed   By: Keith Rake M.D.   On: 04/29/2019 22:36   Dg Hip Unilat W Or Wo Pelvis 2-3 Views Right  Result Date: 04/29/2019 CLINICAL DATA:  Post fall with right hip pain. EXAM: DG HIP (WITH OR WITHOUT PELVIS) 2-3V RIGHT COMPARISON:  None. FINDINGS: Displaced mildly comminuted intertrochanteric right proximal femur fracture. Mild apex lateral angulation. Proximal migration of the femoral shaft. No additional fracture of the pelvis. Pubic rami are intact. Pubic symphysis and sacroiliac joints are congruent. IMPRESSION: Displaced mildly comminuted intertrochanteric right femur fracture. Electronically Signed   By: Keith Rake M.D.   On: 04/29/2019 22:46    Pending Labs Unresulted Labs (From admission, onward)    Start     Ordered   04/30/19 0500  CBC  Tomorrow morning,   STAT     04/29/19 2345   04/30/19 8469  Basic metabolic panel  Tomorrow morning,   STAT     04/29/19 2345   04/29/19 2346  Type  and screen [place]  Once,   STAT    Comments: [place]    04/29/19 2345   04/29/19 2203  SARS Coronavirus 2 (CEPHEID - Performed in [place] hospital lab), Hosp Order  (Asymptomatic Patients Labs)  Once,   STAT    Question:  Rule Out  Answer:  Yes   04/29/19 2202          Vitals/Pain Today's Vitals   04/29/19 2215 04/29/19 2300 04/29/19 2315 04/29/19 2330  BP:  (!) 169/74  (!) 173/97  Pulse:  78 81 81  Resp:  19 12 14   Temp:      TempSrc:      SpO2:  100% 100% 99%  Weight: 54.3 kg     Height: 5\' 1"  (1.549 m)     PainSc:    5     Isolation Precautions No active isolations  Medications Medications  mirtazapine (REMERON) tablet 45 mg (has no administration in time range)  pantoprazole (PROTONIX) EC tablet 40 mg (has no administration in time range)  QUEtiapine (SEROQUEL) tablet 25 mg (has no administration in time range)  HYDROcodone-acetaminophen (NORCO/VICODIN) 5-325 MG per tablet 1-2 tablet (has no administration in time range)  morphine 2 MG/ML injection 0.5 mg (has no administration in time range)  polyethylene glycol (MIRALAX / GLYCOLAX) packet 17 g (has no administration in time range)  morphine 2 MG/ML injection 2 mg (has no administration in time range)  morphine 4 MG/ML injection 4 mg (4 mg Intravenous Given 04/29/19 2340)    Mobility walks with device High fall risk   Focused Assessments Neuro Assessment Handoff:  Swallow screen pass? Not yet given         Neuro Assessment:   Neuro Checks:      Last Documented NIHSS Modified Score:   Has TPA been given? No If patient is a Neuro Trauma  and patient is going to OR before floor call report to [place] nurse: [phone] or [phone]     R Recommendations: See Admitting Provider Note  Report given to:   Additional Notes:

## 2019-04-29 NOTE — ED Notes (Signed)
ED Provider [place]  at bedside to provide status update to daughter.

## 2019-04-29 NOTE — ED Provider Notes (Signed)
[facility] Emergency Department Provider Note       Time seen: ----------------------------------------- 10:09 PM on 04/29/2019 -----------------------------------------   I have reviewed the triage vital signs and the nursing notes.  HISTORY   Chief Complaint Fall    HPI Angel Brandt is a 85 y.o. female with a history of blindness, breast cancer, GERD, osteoporosis who presents to the ED for a fall.  Patient had unwitnessed fall approximate 1 hour ago and was brought in by EMS for same.  Complaining of severe right hip pain with concerns for right hip fracture.  She has no other complaints at this time.  Past Medical History:  Diagnosis Date  . At high risk for falls   . Blind   . Breast cancer ([place])   . Cancer ([place])   . GERD (gastroesophageal reflux disease)   . Hearing impaired   . Macular degeneration   . Malaria   . Osteoporosis     Patient Active Problem List   Diagnosis Date Noted  . TIA (transient ischemic attack) 10/18/2017  . Carcinoma of overlapping sites of right breast in female, estrogen receptor positive ([place]) 02/10/2017  . Diverticulitis 02/10/2016  . H/O malaria 02/10/2016  . Degeneration macular 02/10/2016  . 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 Gait instability 12/04/2014    Past Surgical History:  Procedure Laterality Date  . BREAST LUMPECTOMY    . BREAST SURGERY      Allergies Codeine, Penicillins, Aspirin, Codeine sulfate, Other, and Sulfa antibiotics  Social History Social History   Tobacco Use  . Smoking status: Never Smoker  . Smokeless tobacco: Current User    Types: Snuff  Substance Use Topics  . Alcohol use: No  . Drug use: No   Review of Systems Constitutional: Negative for fever. Cardiovascular: Negative for chest pain. Respiratory: Negative for shortness of breath. Gastrointestinal: Negative for abdominal pain, vomiting and diarrhea. Musculoskeletal: Positive for right hip pain Skin: Negative  for rash. Neurological: Negative for headaches, focal weakness or numbness.  All systems negative/normal/unremarkable except as stated in the HPI  ____________________________________________   PHYSICAL EXAM:  VITAL SIGNS: ED Triage Vitals  Enc Vitals Group     BP      Pulse      Resp      Temp      Temp src      SpO2      Weight      Height      Head Circumference      Peak Flow      Pain Score      Pain Loc      Pain Edu?      Excl. in [place]?    Constitutional: Alert but seemingly disoriented.  Mild distress Eyes:  Normal extraocular movements. ENT      Head: Normocephalic and atraumatic.      Nose: No congestion/rhinnorhea.      Mouth/Throat: Mucous membranes are moist.      Neck: No stridor. Cardiovascular: Normal rate, regular rhythm. No murmurs, rubs, or gallops. Respiratory: Normal respiratory effort without tachypnea nor retractions. Breath sounds are clear and equal bilaterally. No wheezes/rales/rhonchi. Gastrointestinal: Soft and nontender. Normal bowel sounds Musculoskeletal: Severe pain with range of motion of the right hip, right leg appears to be shortened compared to left Neurologic:  Normal speech and language. No gross focal neurologic deficits are appreciated.  Skin:  Skin is warm, dry and intact. No rash noted. Psychiatric: Mood and affect are normal.  Speech and behavior are normal.  ____________________________________________  EKG: Interpreted by me.  Sinus rhythm with rate of 79 bpm, prolonged PR interval, old anterior infarct, normal axis, normal QT  ____________________________________________  ED COURSE:  As part of my medical decision making, I reviewed the following data within the [place] History obtained from family if available, nursing notes, old chart and ekg, as well as notes from prior ED visits. Patient presented for an unwitnessed fall, we will assess with labs and imaging as indicated at this time.    Procedures  Jaeley A Willhelm was evaluated in Emergency Department on 04/29/2019 for the symptoms described in the history of present illness. She was evaluated in the context of the global COVID-19 pandemic, which necessitated consideration that the patient might be at risk for infection with the SARS-CoV-2 virus that causes COVID-19. Institutional protocols and algorithms that pertain to the evaluation of patients at risk for COVID-19 are in a state of rapid change based on information released by regulatory bodies including the CDC and federal and state organizations. These policies and algorithms were followed during the patient's care in the ED.  ____________________________________________   LABS (pertinent positives/negatives)  Labs Reviewed  CBC WITH DIFFERENTIAL/PLATELET - Abnormal; Notable for the following components:      Result Value   RBC 3.52 (*)    Hemoglobin 10.6 (*)    HCT 31.1 (*)    All other components within normal limits  COMPREHENSIVE METABOLIC PANEL - Abnormal; Notable for the following components:   Sodium 128 (*)    Chloride 94 (*)    Calcium 8.8 (*)    All other components within normal limits  URINALYSIS, COMPLETE (UACMP) WITH MICROSCOPIC - Abnormal; Notable for the following components:   Color, Urine STRAW (*)    APPearance CLEAR (*)    Specific Gravity, Urine 1.004 (*)    Hgb urine dipstick SMALL (*)    Bacteria, UA RARE (*)    All other components within normal limits  SARS CORONAVIRUS 2 (HOSPITAL ORDER, [place] LAB)  GLUCOSE, CAPILLARY  CBG MONITORING, ED    RADIOLOGY Images were viewed by me  CT head, chest x-ray, right hip x-ray IMPRESSION: 1. No acute intracranial abnormality. 2. Stable atrophy and chronic small vessel ischemia. IMPRESSION: 1. Interstitial coarsening, most prominent in the periphery of the upper lung zones. Findings suggest interstitial lung disease, and have progressed from prior exam. 2.  Aortic  Atherosclerosis (ICD10-I70.0). IMPRESSION: Displaced mildly comminuted intertrochanteric right femur fracture.   ____________________________________________   DIFFERENTIAL DIAGNOSIS   Fall, hip fracture, dislocation, subdural, sepsis, dehydration, electrolyte abnormality  FINAL ASSESSMENT AND PLAN  Fall, right hip fracture   Plan: The patient had presented for an unwitnessed fall with likely hip fracture. Patient's labs and hyponatremia that is likely chronic. Patient's imaging was concerning for intertrochanteric right hip fracture.  I discussed with orthopedics and the hospitalist service for admission.   Laurence Aly, MD    Note: This note was generated in part or whole with voice recognition software. Voice recognition is usually quite accurate but there are transcription errors that can and very often do occur. I apologize for any typographical errors that were not detected and corrected.     Earleen Newport, MD 04/29/19 2255

## 2019-04-29 NOTE — ED Notes (Signed)
Daughter at bedside at this time.

## 2019-04-30 ENCOUNTER — Inpatient Hospital Stay: Payer: Medicare Other

## 2019-04-30 ENCOUNTER — Inpatient Hospital Stay: Payer: Medicare Other | Admitting: Anesthesiology

## 2019-04-30 ENCOUNTER — Encounter
Admission: EM | Disposition: A | Discharge status: Skilled Nursing Facility | Payer: Self-pay | Source: Home / Self Care | Attending: Internal Medicine

## 2019-04-30 DIAGNOSIS — E43 Unspecified severe protein-calorie malnutrition: Secondary | ICD-10-CM | POA: Insufficient documentation

## 2019-04-30 HISTORY — PX: INTRAMEDULLARY (IM) NAIL INTERTROCHANTERIC: SHX5875

## 2019-04-30 LAB — BASIC METABOLIC PANEL
Anion gap: 5 (ref 5–15)
BUN: 13 mg/dL (ref 8–23)
CO2: 28 mmol/L (ref 22–32)
Calcium: 8.6 mg/dL — ABNORMAL LOW (ref 8.9–10.3)
Chloride: 96 mmol/L — ABNORMAL LOW (ref 98–111)
Creatinine, Ser: 0.73 mg/dL (ref 0.44–1.00)
GFR calc Af Amer: >60 mL/min (ref >60)
GFR calc non Af Amer: >60 mL/min (ref >60)
Glucose, Bld: 123 mg/dL — ABNORMAL HIGH (ref 70–99)
Potassium: 4.1 mmol/L (ref 3.5–5.1)
Sodium: 129 mmol/L — ABNORMAL LOW (ref 135–145)

## 2019-04-30 LAB — TYPE AND SCREEN
ABO/RH(D): B POS
Antibody Screen: NEGATIVE

## 2019-04-30 LAB — CBC
HCT: 30.3 % — ABNORMAL LOW (ref 36.0–46.0)
Hemoglobin: 10.3 g/dL — ABNORMAL LOW (ref 12.0–15.0)
MCH: 30.1 pg (ref 26.0–34.0)
MCHC: 34 g/dL (ref 30.0–36.0)
MCV: 88.6 fL (ref 80.0–100.0)
Platelets: 180 10*3/uL (ref 150–400)
RBC: 3.42 MIL/uL — ABNORMAL LOW (ref 3.87–5.11)
RDW: 12.8 % (ref 11.5–15.5)
WBC: 10 10*3/uL (ref 4.0–10.5)
nRBC: 0 % (ref 0.0–0.2)

## 2019-04-30 LAB — SURGICAL PCR SCREEN
MRSA, PCR: NEGATIVE
Staphylococcus aureus: NEGATIVE

## 2019-04-30 LAB — SARS CORONAVIRUS 2 BY RT PCR (HOSPITAL ORDER, PERFORMED IN ~~LOC~~ HOSPITAL LAB): SARS Coronavirus 2: NEGATIVE

## 2019-04-30 LAB — PROTIME-INR
INR: 1.1 (ref 0.8–1.2)
Prothrombin Time: 14.5 seconds (ref 11.4–15.2)

## 2019-04-30 LAB — APTT: aPTT: 29 seconds (ref 24–36)

## 2019-04-30 SURGERY — FIXATION, FRACTURE, INTERTROCHANTERIC, WITH INTRAMEDULLARY ROD
Anesthesia: Spinal | Laterality: Right

## 2019-04-30 MED ORDER — CLINDAMYCIN PHOSPHATE 600 MG/50ML IV SOLN
600.0000 mg | Freq: Once | INTRAVENOUS | Status: AC
  Administered 2019-04-30: 600 mg via INTRAVENOUS
  Filled 2019-04-30 (×2): qty 50

## 2019-04-30 MED ORDER — ONDANSETRON HCL 4 MG/2ML IJ SOLN: 4.0000 mg | Freq: Once | INTRAMUSCULAR | Status: DC | PRN

## 2019-04-30 MED ORDER — BISACODYL 10 MG RE SUPP
10.0000 mg | Freq: Every day | RECTAL | Status: DC | PRN
  Administered 2019-05-02: 10 mg via RECTAL
  Filled 2019-04-30: qty 1

## 2019-04-30 MED ORDER — ACETAMINOPHEN 500 MG PO TABS
500.0000 mg | ORAL_TABLET | Freq: Four times a day (QID) | ORAL | Status: AC
  Administered 2019-04-30 – 2019-05-01 (×4): 500 mg via ORAL
  Filled 2019-04-30 (×4): qty 1

## 2019-04-30 MED ORDER — PROPOFOL 10 MG/ML IV BOLUS
INTRAVENOUS | Status: AC
  Filled 2019-04-30: qty 20

## 2019-04-30 MED ORDER — DOCUSATE SODIUM 100 MG PO CAPS
100.0000 mg | ORAL_CAPSULE | Freq: Two times a day (BID) | ORAL | Status: DC
  Administered 2019-05-01 – 2019-05-03 (×5): 100 mg via ORAL
  Filled 2019-04-30 (×5): qty 1

## 2019-04-30 MED ORDER — ACETAMINOPHEN 325 MG PO TABS
325.0000 mg | ORAL_TABLET | Freq: Four times a day (QID) | ORAL | Status: DC | PRN
  Administered 2019-05-01: 23:00:00 650 mg via ORAL
  Filled 2019-04-30: qty 2

## 2019-04-30 MED ORDER — LACTATED RINGERS IV SOLN
INTRAVENOUS | Status: DC | PRN
  Administered 2019-04-30: 18:00:00 via INTRAVENOUS

## 2019-04-30 MED ORDER — CLINDAMYCIN PHOSPHATE 600 MG/50ML IV SOLN
600.0000 mg | Freq: Four times a day (QID) | INTRAVENOUS | Status: AC
  Administered 2019-05-01 (×2): 600 mg via INTRAVENOUS
  Filled 2019-04-30 (×2): qty 50

## 2019-04-30 MED ORDER — ACETAMINOPHEN 10 MG/ML IV SOLN
INTRAVENOUS | Status: AC
  Filled 2019-04-30: qty 100

## 2019-04-30 MED ORDER — PROPOFOL 500 MG/50ML IV EMUL
INTRAVENOUS | Status: DC | PRN
  Administered 2019-04-30: 20 ug/kg/min via INTRAVENOUS

## 2019-04-30 MED ORDER — SODIUM CHLORIDE 0.9 % IV SOLN
INTRAVENOUS | Status: DC | PRN
  Administered 2019-04-30: 20 ug/min via INTRAVENOUS

## 2019-04-30 MED ORDER — FENTANYL CITRATE (PF) 100 MCG/2ML IJ SOLN
INTRAMUSCULAR | Status: AC
  Filled 2019-04-30: qty 2

## 2019-04-30 MED ORDER — FENTANYL CITRATE (PF) 100 MCG/2ML IJ SOLN: 25.0000 ug | INTRAMUSCULAR | Status: DC | PRN

## 2019-04-30 MED ORDER — SODIUM CHLORIDE FLUSH 0.9 % IV SOLN
INTRAVENOUS | Status: AC
  Filled 2019-04-30: qty 10

## 2019-04-30 MED ORDER — MAGNESIUM CITRATE PO SOLN
1.0000 | Freq: Once | ORAL | Status: DC | PRN
  Filled 2019-04-30: qty 296

## 2019-04-30 MED ORDER — FENTANYL CITRATE (PF) 100 MCG/2ML IJ SOLN
INTRAMUSCULAR | Status: DC | PRN
  Administered 2019-04-30 (×3): 25 ug via INTRAVENOUS

## 2019-04-30 MED ORDER — HYDROCODONE-ACETAMINOPHEN 5-325 MG PO TABS
1.0000 | ORAL_TABLET | ORAL | Status: DC | PRN
  Administered 2019-05-01: 1 via ORAL
  Filled 2019-04-30: qty 1

## 2019-04-30 MED ORDER — BUPIVACAINE HCL (PF) 0.5 % IJ SOLN
INTRAMUSCULAR | Status: AC
  Filled 2019-04-30: qty 10

## 2019-04-30 MED ORDER — ENSURE ENLIVE PO LIQD
237.0000 mL | Freq: Two times a day (BID) | ORAL | Status: DC
  Administered 2019-05-01: 237 mL via ORAL

## 2019-04-30 MED ORDER — MORPHINE SULFATE (PF) 2 MG/ML IV SOLN: 0.5000 mg | INTRAVENOUS | Status: DC | PRN

## 2019-04-30 MED ORDER — POTASSIUM CHLORIDE IN NACL 20-0.9 MEQ/L-% IV SOLN
INTRAVENOUS | Status: DC
  Administered 2019-04-30: 23:00:00 via INTRAVENOUS
  Filled 2019-04-30 (×3): qty 1000

## 2019-04-30 MED ORDER — SEVOFLURANE IN SOLN
RESPIRATORY_TRACT | Status: AC
  Filled 2019-04-30: qty 250

## 2019-04-30 MED ORDER — ACETAMINOPHEN 10 MG/ML IV SOLN
INTRAVENOUS | Status: DC | PRN
  Administered 2019-04-30: 1000 mg via INTRAVENOUS

## 2019-04-30 MED ORDER — PROPOFOL 10 MG/ML IV BOLUS
INTRAVENOUS | Status: DC | PRN
  Administered 2019-04-30: 10 mg via INTRAVENOUS
  Administered 2019-04-30: 15 mg via INTRAVENOUS

## 2019-04-30 MED ORDER — LACTATED RINGERS IV SOLN: INTRAVENOUS | Status: DC

## 2019-04-30 MED ORDER — HYDROCODONE-ACETAMINOPHEN 7.5-325 MG PO TABS: 1.0000 | ORAL_TABLET | ORAL | Status: DC | PRN

## 2019-04-30 MED ORDER — ENOXAPARIN SODIUM 40 MG/0.4ML ~~LOC~~ SOLN
40.0000 mg | SUBCUTANEOUS | Status: DC
  Administered 2019-05-01 – 2019-05-03 (×3): 40 mg via SUBCUTANEOUS
  Filled 2019-04-30 (×4): qty 0.4

## 2019-04-30 MED ORDER — BUPIVACAINE HCL (PF) 0.5 % IJ SOLN
INTRAMUSCULAR | Status: DC | PRN
  Administered 2019-04-30: 2.5 mL

## 2019-04-30 MED ORDER — ONDANSETRON HCL 4 MG/2ML IJ SOLN: 4.0000 mg | Freq: Four times a day (QID) | INTRAMUSCULAR | Status: DC | PRN

## 2019-04-30 MED ORDER — ONDANSETRON HCL 4 MG PO TABS: 4.0000 mg | ORAL_TABLET | Freq: Four times a day (QID) | ORAL | Status: DC | PRN

## 2019-04-30 MED ORDER — TRAMADOL HCL 50 MG PO TABS
50.0000 mg | ORAL_TABLET | Freq: Four times a day (QID) | ORAL | Status: DC | PRN
  Administered 2019-04-30 – 2019-05-02 (×3): 50 mg via ORAL
  Filled 2019-04-30 (×3): qty 1

## 2019-04-30 MED ORDER — ALUM & MAG HYDROXIDE-SIMETH 200-200-20 MG/5ML PO SUSP: 30.0000 mL | ORAL | Status: DC | PRN

## 2019-04-30 MED ORDER — FERROUS SULFATE 325 (65 FE) MG PO TABS
325.0000 mg | ORAL_TABLET | Freq: Three times a day (TID) | ORAL | Status: DC
  Administered 2019-05-01 – 2019-05-03 (×6): 325 mg via ORAL
  Filled 2019-04-30 (×7): qty 1

## 2019-04-30 MED ORDER — ADULT MULTIVITAMIN W/MINERALS CH
1.0000 | ORAL_TABLET | Freq: Every day | ORAL | Status: DC
  Administered 2019-05-01 – 2019-05-03 (×3): 1 via ORAL
  Filled 2019-04-30 (×3): qty 1

## 2019-04-30 SURGICAL SUPPLY — 45 items
BIT DRILL CANN LG 4.3MM (BIT) IMPLANT
BNDG COHESIVE 6X5 TAN STRL LF (GAUZE/BANDAGES/DRESSINGS) ×6 IMPLANT
CANISTER SUCT 1200ML W/VALVE (MISCELLANEOUS) ×3 IMPLANT
COVER WAND RF STERILE (DRAPES) ×3 IMPLANT
CRADLE LAMINECT ARM (MISCELLANEOUS) ×3 IMPLANT
DRAPE SHEET LG 3/4 BI-LAMINATE (DRAPES) ×6 IMPLANT
DRAPE SURG 17X11 SM STRL (DRAPES) ×6 IMPLANT
DRAPE TABLE BACK 80X90 (DRAPES) ×2 IMPLANT
DRAPE U-SHAPE 47X51 STRL (DRAPES) ×3 IMPLANT
DRILL BIT CANN LG 4.3MM (BIT) ×3
DRSG OPSITE POSTOP 3X4 (GAUZE/BANDAGES/DRESSINGS) ×2 IMPLANT
DRSG OPSITE POSTOP 4X14 (GAUZE/BANDAGES/DRESSINGS) IMPLANT
DRSG OPSITE POSTOP 4X6 (GAUZE/BANDAGES/DRESSINGS) ×5 IMPLANT
DURAPREP 26ML APPLICATOR (WOUND CARE) ×6 IMPLANT
ELECT CAUTERY BLADE 6.4 (BLADE) ×2 IMPLANT
ELECT REM PT RETURN 9FT ADLT (ELECTROSURGICAL) ×3
ELECTRODE REM PT RTRN 9FT ADLT (ELECTROSURGICAL) ×1 IMPLANT
GLOVE BIOGEL PI IND STRL 9 (GLOVE) ×1 IMPLANT
GLOVE BIOGEL PI INDICATOR 9 (GLOVE) ×2
GLOVE SURG 9.0 ORTHO LTXF (GLOVE) ×6 IMPLANT
GOWN STRL REUS TWL 2XL XL LVL4 (GOWN DISPOSABLE) ×3 IMPLANT
GOWN STRL REUS W/ TWL LRG LVL3 (GOWN DISPOSABLE) ×1 IMPLANT
GOWN STRL REUS W/TWL LRG LVL3 (GOWN DISPOSABLE) ×2
GUIDEPIN VERSANAIL DSP 3.2X444 (ORTHOPEDIC DISPOSABLE SUPPLIES) ×2 IMPLANT
HEMOVAC 400CC 10FR (MISCELLANEOUS) IMPLANT
HIP FRA NAIL LAG SCREW 10.5X90 (Orthopedic Implant) ×3 IMPLANT
KIT TURNOVER CYSTO (KITS) ×3 IMPLANT
MAT ABSORB  FLUID 56X50 GRAY (MISCELLANEOUS) ×2
MAT ABSORB FLUID 56X50 GRAY (MISCELLANEOUS) ×1 IMPLANT
NAIL HIP FRACT 130D 11X180 (Screw) ×2 IMPLANT
NDL SAFETY ECLIPSE 18X1.5 (NEEDLE) IMPLANT
NEEDLE HYPO 18GX1.5 SHARP (NEEDLE) ×2
NS IRRIG 1000ML POUR BTL (IV SOLUTION) ×3 IMPLANT
PACK HIP COMPR (MISCELLANEOUS) ×3 IMPLANT
SCREW BONE CORTICAL 5.0X36 (Screw) ×2 IMPLANT
SCREW LAG HIP FRA NAIL 10.5X90 (Orthopedic Implant) IMPLANT
STAPLER SKIN PROX 35W (STAPLE) ×3 IMPLANT
SUCTION FRAZIER HANDLE 10FR (MISCELLANEOUS) ×2
SUCTION TUBE FRAZIER 10FR DISP (MISCELLANEOUS) ×1 IMPLANT
SUT VIC AB 0 CT1 36 (SUTURE) ×6 IMPLANT
SUT VIC AB 2-0 CT1 27 (SUTURE) ×2
SUT VIC AB 2-0 CT1 TAPERPNT 27 (SUTURE) ×1 IMPLANT
SUT VICRYL 0 AB UR-6 (SUTURE) ×5 IMPLANT
SYR 30ML LL (SYRINGE) ×3 IMPLANT
SYR 3ML LL SCALE MARK (SYRINGE) ×2 IMPLANT

## 2019-04-30 NOTE — Anesthesia Post-op Follow-up Note (Signed)
Anesthesia QCDR form completed.        

## 2019-04-30 NOTE — Anesthesia Procedure Notes (Signed)
Spinal  Patient location during procedure: OR Start time: 04/30/2019 6:06 PM Staffing Anesthesiologist: Molli Barrows, MD Resident/CRNA: Lesa Vandall, Einar Grad, CRNA Preanesthetic Checklist Completed: patient identified, site marked, surgical consent, pre-op evaluation, timeout performed, IV checked, risks and benefits discussed and monitors and equipment checked Spinal Block Patient position: sitting Prep: DuraPrep Patient monitoring: heart rate, cardiac monitor, continuous pulse ox and blood pressure Approach: midline Location: L3-4 Injection technique: single-shot Needle Needle type: Sprotte  Needle gauge: 24 G Needle length: 9 cm Assessment Sensory level: T4

## 2019-04-30 NOTE — Progress Notes (Signed)
Initial Nutrition Assessment  DOCUMENTATION CODES:   Severe malnutrition in context of social or environmental circumstances  INTERVENTION:  Provide Ensure Enlive po BID with diet advancement, each supplement provides 350 kcal and 20 grams of protein.   Provide MVI daily.  NUTRITION DIAGNOSIS:   Severe Malnutrition related to social / environmental circumstances(advanced age, inadequate oral intake) as evidenced by severe fat depletion, severe muscle depletion.  GOAL:   Patient will meet greater than or equal to 90% of their needs  MONITOR:   PO intake, Supplement acceptance, Labs, Weight trends, Skin, I & O's  REASON FOR ASSESSMENT:   Consult Hip fracture protocol  ASSESSMENT:   85 year old female with PMHx of OP, breast cancer s/p lumpectomy, macular degeneration, hearing impairment, malaria, GERD, blindness who was admitted after a fall with right intertrochanteric hip fracture.   Met with patient in room this morning but she was sleeping. Per chart patient is a poor historian. Safety sitter at bedside. Noted patient was edentulous. Denture tray was present at sink but did not see dentures inside. They [place] be somewhere else in the room. Patient is NPO for OR later today.  Patient's UBW appears to be around 63-64 kg per chart. She was 63.5 kg on 10/17/2017. She is currently 54.3 kg (119.8 lbs).  Medications reviewed and include: Remeron 45 mg QHS, pantoprazole, Seroquel 25 mg QHS.  Labs reviewed: CBG 87, Sodium 129, Chloride 96.  NUTRITION - FOCUSED PHYSICAL EXAM:    Most Recent Value  Orbital Region  Severe depletion  Upper Arm Region  Moderate depletion  Thoracic and Lumbar Region  Severe depletion  Buccal Region  Severe depletion  Temple Region  Severe depletion  Clavicle Bone Region  Severe depletion  Clavicle and Acromion Bone Region  Severe depletion  Scapular Bone Region  Unable to assess  Dorsal Hand  Severe depletion  Patellar Region  Moderate depletion   Anterior Thigh Region  Moderate depletion  Posterior Calf Region  Moderate depletion  Edema (RD Assessment)  Unable to assess  Hair  Reviewed  Eyes  Reviewed  Mouth  Reviewed  Skin  Reviewed  Nails  Reviewed     Diet Order:   Diet Order            Diet NPO time specified  Diet effective now             EDUCATION NEEDS:   No education needs have been identified at this time  Skin:  Skin Assessment: Reviewed RN Assessment  Last BM:  Unknown  Height:   Ht Readings from Last 1 Encounters:  04/29/19 5' 1" (1.549 m)   Weight:   Wt Readings from Last 1 Encounters:  04/29/19 54.3 kg   Ideal Body Weight:  47.7 kg  BMI:  Body mass index is 22.64 kg/m.  Estimated Nutritional Needs:   Kcal:  1400-1600  Protein:  70-80 grams  Fluid:  1.4-1.6 L/day  Willey Blade, MS, RD, LDN Office: [phone] Pager: [phone] After Hours/Weekend Pager: [phone]

## 2019-04-30 NOTE — NC FL2 (Signed)
[place] LEVEL OF CARE SCREENING TOOL     IDENTIFICATION  Patient Name: Angel Brandt Birthdate: June 01, 1924 Sex: female Admission Date (Current Location): 04/29/2019  [place] and [place] Number:  Engineering geologist and Address:  [facility], [address] [place], [place] [address]      Provider Number: [id number]  Attending Physician Name and Address:  Vaughan Basta, *  Relative Name and Phone Number:       Current Level of Care: Hospital Recommended Level of Care: [place] Prior Approval Number:    Date Approved/Denied:   PASRR Number: [id number]  Discharge Plan: SNF    Current Diagnoses: Patient Active Problem List   Diagnosis Date Noted  . Closed right hip fracture, initial encounter ([place]) 04/29/2019  . TIA (transient ischemic attack) 10/18/2017  . Carcinoma of overlapping sites of right breast in female, estrogen receptor positive ([place]) 02/10/2017  . Diverticulitis 02/10/2016  . H/O malaria 02/10/2016  . Degeneration macular 02/10/2016  . 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 Gait instability 12/04/2014    Orientation RESPIRATION BLADDER Height & Weight     Self  Normal Continent Weight: 119 lb 12.8 oz (54.3 kg) Height:  5\' 1"  (154.9 cm)  BEHAVIORAL SYMPTOMS/MOOD NEUROLOGICAL BOWEL NUTRITION STATUS      Continent Diet(Diet: NPO for surgery to be advanced.)  AMBULATORY STATUS COMMUNICATION OF NEEDS Skin   Extensive Assist Verbally Surgical wounds                       Personal Care Assistance Level of Assistance  Bathing, Feeding, Dressing Bathing Assistance: Limited assistance Feeding assistance: Limited assistance Dressing Assistance: Limited assistance     Functional Limitations Info  Sight, Hearing, Speech Sight Info: Adequate Hearing Info: Adequate Speech Info: Adequate    SPECIAL CARE FACTORS FREQUENCY  PT (By licensed PT), OT (By licensed OT)     PT Frequency: 5 OT  Frequency: 5            Contractures      Additional Factors Info  Code Status, Allergies Code Status Info: Full Code. Allergies Info: Codeine, Penicillins, Ativan Lorazepam, Codeine Sulfate, Other, Sulfa Antibiotics           Current Medications (04/30/2019):  This is the current hospital active medication list Current Facility-Administered Medications  Medication Dose Route Frequency Provider Last Rate Last Dose  . clindamycin (CLEOCIN) IVPB 600 mg  600 mg Intravenous Once [place], MD      . mirtazapine (REMERON) tablet 45 mg  45 mg Oral QHS Seals, Levada Dy H, NP   45 mg at 04/30/19 4332  . morphine 2 MG/ML injection 0.5 mg  0.5 mg Intravenous Q2H PRN Seals, Levada Dy H, NP   0.5 mg at 04/30/19 0353  . morphine 2 MG/ML injection 2 mg  2 mg Intravenous NOW Seals, Angela H, NP      . pantoprazole (PROTONIX) EC tablet 40 mg  40 mg Oral Daily Seals, Theo Dills, NP   Stopped at 04/30/19 [phone]  . polyethylene glycol (MIRALAX / GLYCOLAX) packet 17 g  17 g Oral Daily PRN Seals, Levada Dy H, NP      . QUEtiapine (SEROQUEL) tablet 25 mg  25 mg Oral QHS Seals, Angela H, NP   25 mg at 04/30/19 8416  . traMADol (ULTRAM) tablet 50 mg  50 mg Oral Q6H PRN Mayer Camel, NP   50 mg at 04/30/19 6063     Discharge  Medications: Please see discharge summary for a list of discharge medications.  Relevant Imaging Results:  Relevant Lab Results:   Additional Information SSN: [id number]  Anett Ranker, Veronia Beets, LCSW

## 2019-04-30 NOTE — Progress Notes (Signed)
OT Cancellation Note  Patient Details Name: Angel Brandt MRN: [id number] DOB: [birth year]   Cancelled Treatment:    Reason Eval/Treat Not Completed: Medical issues which prohibited therapy. Consult received and chart reviewed.  Patient noted with acute hip fracture, pending surgical repair this PM.  Per guidelines, will require new orders post-procedure.  Mobility contraindicated until post-op. Will complete order at this time.  Please re-consult as medically appropriate status post surgical correction of R hip fracture.  Jeni Salles, MPH, MS, OTR/L ascom [phone] 04/30/19, 7:44 AM

## 2019-04-30 NOTE — Progress Notes (Signed)
PT Cancellation Note  Patient Details Name: Angel Brandt MRN: [id number] DOB: 05-05-1924   Cancelled Treatment:    Reason Eval/Treat Not Completed: Medical issues which prohibited therapy(Consult received and chart reviewed.  Patient noted with acute hip fracture, pending surgical repair this PM.  Per guidelines, will require new orders post-procedure.  Mobility contraindicated until post-op. Will complete order at this time.  Please re-consult as medically appropriate status post surgical correction of R hip fracture.)   Milano Rosevear H. Owens Shark, PT, DPT, NCS 04/30/19, 7:38 AM [phone]

## 2019-04-30 NOTE — Op Note (Signed)
DATE OF SURGERY:  04/30/2019  TIME: 7:28 PM  PATIENT NAME:  Angel Brandt  AGE: 85 y.o.  PRE-OPERATIVE DIAGNOSIS:  Right intertrochanteric hip Fracture  POST-OPERATIVE DIAGNOSIS:  SAME  PROCEDURE:  INTRAMEDULLARY FIXATION OF RIGHT  INTERTROCHANTERIC HIP FRACTURE  SURGEON:  Thornton Park  OPERATIVE IMPLANTS: Biomet short Affixus nail 180 x 12mm, 90 mm lag screw with a 36 mm distal interlocking screw  PREOPERATIVE INDICATIONS:  Angel Brandt is a 85 y.o. year old who fell and suffered a hip fracture. She was brought into the ER and then admitted and medically cleared for surgical intervention.    The risks, benefits and alternatives were discussed with the patient and their family.  The risks include but are not limited to infection, bleeding, nerve or blood vessel injury, malunion, nonunion, hardware prominence, hardware failure, change in leg lengths or lower extremity rotation need for further surgery including hardware removal with conversion to a total hip arthroplasty. Medical risks include but are not limited to DVT and pulmonary embolism, myocardial infarction, stroke, pneumonia, respiratory failure and death. The patient and their family understood these risks and wished to proceed with surgery.  OPERATIVE PROCEDURE:  The patient was brought to the operating room and placed in the supine position on the fracture table. A spinal anesthesia was administered.  A time out was performed to verify the patient's name, date of birth, medical record number, correct site of surgery correct procedure to be performed. The timeout was also used to verify the patient received antibiotics and all appropriate instruments, implants and radiographic studies were available in the room. Once all in attendance were in agreement, the case began. A closed reduction was performed under C-arm guidance.  The fracture reduction was confirmed on both AP and lateral views.  The patient was prepped and draped in a  sterile fashion. She received preoperative antibiotics with clindamycin due to her penicillin allergy.  An incision was made proximal to the greater trochanter in line with the femur. A guidewire was placed over the tip of the greater trochanter and advanced into the proximal femur to the level of the lesser trochanter.  Confirmation of the drill pin position was made on AP and lateral C-arm images.  The threaded guidepin was then overdrilled with the proximal femoral drill.  The 180 x 11 mm nail was then inserted into the proximal femur, across the fracture site and into the femoral shaft. Its position was confirmed on AP and lateral C-arm images.   Once the nail was completely seated, the drill guide for the lag screw was placed through the guide arm for the Affixus nail. A guidepin was then placed through this drill guide and advanced through the lateral cortex of the femur, across the fracture site and into the femoral head achieving a tip apex distance of less than 25 mm. The depth of the drill pin was measured, and then the drill for the lag screw was advanced over the guidepin and through the lateral cortex, across the fracture site and up into the femoral head to the depth previously measured.  The lag screw was then advanced by hand into position across the fracture site into the femoral head. Its final position was confirmed on AP and lateral C-arm images. Compression was applied. The set screw in the top of the intramedullary rod was tightened by hand using a screwdriver. It was backed off a quarter turn to allow for compression at the fracture site during weight bearing.  The drill sleeve for the distal interlocking screw was then placed through the Affixus guide arm. A small stab incision was made to allow the drill guide to approximate the lateral cortex of the femur. The drill for the distal interlocking screw was then advanced bicortically. The depth of this drill was measured. A distal  interlocking screw with the length of 36 was then inserted by hand through the guide arm. Final C-arm images of the entire intramedullary construct were taken in both the AP and lateral planes.   The wounds were irrigated copiously and closed with 0 Vicryl for closure of the deep fascia and 2-0 Vicryl for subcutaneous closure. The skin was approximated with staples. A dry sterile dressing was applied. I was scrubbed and present the entire case and all sharp and instrument counts were correct at the conclusion of the case. Patient was transferred to hospital bed and brought to PACU in stable condition. I spoke with the patient's daughter by phone from the PACU due to COVID-19 restrictions on visitors and let her know the case had been performed without complication and the patient was stable in the recovery room.     Timoteo Gaul, MD

## 2019-04-30 NOTE — Progress Notes (Signed)
[facility] [place] NAME: Angel Brandt    MR#:  [id number]  DATE OF BIRTH:  September 10, 1924  SUBJECTIVE:  CHIEF COMPLAINT:   Chief Complaint  Patient presents with  . Fall   Patient is sent to emergency room with fall and fracture on the hip.  She was confused this morning but was not in any acute distress.  REVIEW OF SYSTEMS:  Due to confusion patient cannot give review of system ROS  DRUG ALLERGIES:   Allergies  Allergen Reactions  . Codeine Anaphylaxis  . Penicillins Anaphylaxis and Nausea And Vomiting  . Ativan [Lorazepam] Other (See Comments)    Agitation  . Codeine Sulfate Nausea And Vomiting  . Other Other (See Comments)  . Sulfa Antibiotics Other (See Comments)    unknown    VITALS:  Blood pressure (!) 158/58, pulse 81, temperature 98.5 F (36.9 C), temperature source Oral, resp. rate 20, height 5\' 1"  (1.549 m), weight 54.3 kg, SpO2 100 %.  PHYSICAL EXAMINATION:  GENERAL:  85 y.o.-year-old patient lying in the bed with no acute distress.  EYES: Pupils equal, round, reactive to light and accommodation. No scleral icterus. Extraocular muscles intact.  HEENT: Head atraumatic, normocephalic. Oropharynx and nasopharynx clear.  NECK:  Supple, no jugular venous distention. No thyroid enlargement, no tenderness.  LUNGS: Normal breath sounds bilaterally, no wheezing, rales,rhonchi or crepitation. No use of accessory muscles of respiration.  CARDIOVASCULAR: S1, S2 normal. No murmurs, rubs, or gallops.  ABDOMEN: Soft, nontender, nondistended. Bowel sounds present. No organomegaly or mass.  EXTREMITIES: No pedal edema, cyanosis, or clubbing.  NEUROLOGIC: Cranial nerves II through XII are intact. Muscle strength 3/5 in all extremities. Sensation intact. Gait not checked.  PSYCHIATRIC: The patient is alert and oriented x 0.  SKIN: No obvious rash, lesion, or ulcer.   Physical Exam LABORATORY PANEL:   CBC Recent Labs  Lab  04/30/19 0216  WBC 10.0  HGB 10.3*  HCT 30.3*  PLT 180   ------------------------------------------------------------------------------------------------------------------  Chemistries  Recent Labs  Lab 04/29/19 2204 04/30/19 0216  NA 128* 129*  K 4.2 4.1  CL 94* 96*  CO2 24 28  GLUCOSE 99 123*  BUN 14 13  CREATININE 0.75 0.73  CALCIUM 8.8* 8.6*  AST 20  --   ALT 15  --   ALKPHOS 110  --   BILITOT 0.5  --    ------------------------------------------------------------------------------------------------------------------  Cardiac Enzymes No results for input(s): TROPONINI in the last 168 hours. ------------------------------------------------------------------------------------------------------------------  RADIOLOGY:  Dg Chest 1 View  Result Date: 04/29/2019 CLINICAL DATA:  Right hip fracture. Post fall. EXAM: CHEST  1 VIEW COMPARISON:  Chest radiograph 10/17/2017 FINDINGS: Diffuse interstitial coarsening, prominent in the upper lung zones peripherally. Normal heart size and mediastinal contours. Aortic atherosclerosis. No confluent airspace disease, large pleural effusion or pneumothorax. No acute osseous abnormalities are seen. IMPRESSION: 1. Interstitial coarsening, most prominent in the periphery of the upper lung zones. Findings suggest interstitial lung disease, and have progressed from prior exam. 2.  Aortic Atherosclerosis (ICD10-I70.0). Electronically Signed   By: Keith Rake M.D.   On: 04/29/2019 22:48   Ct Head Wo Contrast  Result Date: 04/29/2019 CLINICAL DATA:  Altered level of consciousness. Unwitnessed fall. EXAM: CT HEAD WITHOUT CONTRAST TECHNIQUE: Contiguous axial images were obtained from the base of the skull through the vertex without intravenous contrast. COMPARISON:  Head CT 10/17/2017 FINDINGS: Brain: No intracranial hemorrhage, mass effect, or midline shift. Generalized atrophy and chronic small vessel  ischemia, stable from prior exam. Punctate  remote lacunar infarct in left basal ganglia. No hydrocephalus. The basilar cisterns are patent. No evidence of territorial infarct or acute ischemia. Tiny incidental lipoma posterior to the pons. No extra-axial or intracranial fluid collection. Vascular: Atherosclerosis of skullbase vasculature without hyperdense vessel or abnormal calcification. Skull: No fracture or focal lesion. Sinuses/Orbits: Paranasal sinuses are clear. Chronic opacification of lower right mastoid air cells. The visualized orbits are unremarkable. Bilateral cataract resection. Other: None. IMPRESSION: 1. No acute intracranial abnormality. 2. Stable atrophy and chronic small vessel ischemia. Electronically Signed   By: Keith Rake M.D.   On: 04/29/2019 22:36   Dg Hip Unilat W Or Wo Pelvis 2-3 Views Right  Result Date: 04/29/2019 CLINICAL DATA:  Post fall with right hip pain. EXAM: DG HIP (WITH OR WITHOUT PELVIS) 2-3V RIGHT COMPARISON:  None. FINDINGS: Displaced mildly comminuted intertrochanteric right proximal femur fracture. Mild apex lateral angulation. Proximal migration of the femoral shaft. No additional fracture of the pelvis. Pubic rami are intact. Pubic symphysis and sacroiliac joints are congruent. IMPRESSION: Displaced mildly comminuted intertrochanteric right femur fracture. Electronically Signed   By: Keith Rake M.D.   On: 04/29/2019 22:46    ASSESSMENT AND PLAN:   Active Problems:   Closed right hip fracture, initial encounter (HCC)   Protein-calorie malnutrition, severe  1.  Right hip fracture - Dr. Lisette Grinder consulted for further management with surgical intervention planned for today. - Pain is being managed with IV analgesic -Patient is n.p.o.  -Repeat CBC and BMP in the a.m.  2.  Acute right lower extremity pain - We will manage with IV analgesic  3.  GERD - IV PPI prophylaxis  DVT prophylaxis with SCDs and PPI prophylaxis initiated  I tried calling patient's daughter on the phone but  could not leave a voicemail for her.  All the records are reviewed and case discussed with Care Management/Social Workerr. Management plans discussed with the patient, family and they are in agreement.  CODE STATUS: full.  TOTAL TIME TAKING CARE OF THIS PATIENT: 35 minutes.     POSSIBLE D/C IN 1-2 DAYS, DEPENDING ON CLINICAL CONDITION.   Vaughan Basta M.D on 04/30/2019   Between 7am to 6pm - Pager - [phone]  After 6pm go to [email or web address] - password [email or web address] [facility]  Office  [phone]  CC: Primary care physician; [place] Pink, MD  Note: This dictation was prepared with Dragon dictation along with smaller phrase technology. Any transcriptional errors that result from this process are unintentional.

## 2019-04-30 NOTE — Consult Note (Signed)
ORTHOPAEDIC CONSULTATION  REQUESTING PHYSICIAN: Vaughan Basta, *  Chief Complaint: Right intertrochanteric hip fracture  HPI: Angel Brandt is a 85 y.o. female is resting comfortably in her bed this morning.  Patient was awoken by me for examination.  Patient denies significant right hip pain currently.  Patient is drowsy and is unable to provide an accurate history.  I have spoken with her daughter by phone who explains that she does ambulate at baseline.  Past Medical History:  Diagnosis Date  . At high risk for falls   . Blind   . Breast cancer ([place])   . Cancer ([place])   . Sherran Needs syndrome 2017  . GERD (gastroesophageal reflux disease)   . Hearing impaired   . Macular degeneration   . Malaria   . Osteoporosis    Past Surgical History:  Procedure Laterality Date  . BREAST LUMPECTOMY    . BREAST SURGERY     Social History   Socioeconomic History  . Marital status: Widowed    Spouse name: Not on file  . Number of children: Not on file  . Years of education: Not on file  . Highest education level: Not on file  Occupational History  . Occupation: retired  Scientific laboratory technician  . Financial resource strain: Not on file  . Food insecurity    Worry: Not on file    Inability: Not on file  . Transportation needs    Medical: Not on file    Non-medical: Not on file  Tobacco Use  . Smoking status: Never Smoker  . Smokeless tobacco: Current User    Types: Snuff  Substance and Sexual Activity  . Alcohol use: No  . Drug use: No  . Sexual activity: Not on file  Lifestyle  . Physical activity    Days per week: Not on file    Minutes per session: Not on file  . Stress: Not on file  Relationships  . Social Herbalist on phone: Not on file    Gets together: Not on file    Attends religious service: Not on file    Active member of club or organization: Not on file    Attends meetings of clubs or organizations: Not on file    Relationship status: Not on  file  Other Topics Concern  . Not on file  Social History Narrative  . Not on file   History reviewed. No pertinent family history. Allergies  Allergen Reactions  . Codeine Anaphylaxis  . Penicillins Anaphylaxis and Nausea And Vomiting  . Ativan [Lorazepam] Other (See Comments)    Agitation  . Codeine Sulfate Nausea And Vomiting  . Other Other (See Comments)  . Sulfa Antibiotics Other (See Comments)    unknown   Prior to Admission medications   Medication Sig Start Date End Date Taking? Authorizing Provider  acetaminophen (TYLENOL) 500 MG tablet Take 1,000 mg by mouth every 6 (six) hours as needed for mild pain.   Yes [provider]  aspirin EC 81 MG EC tablet Take 1 tablet (81 mg total) by mouth daily. 10/20/17  Yes Wieting, Richard, MD  mirtazapine (REMERON) 45 MG tablet Take 45 mg by mouth at bedtime.  05/27/15  Yes [provider]  omeprazole (PRILOSEC) 20 MG capsule Take 20 mg by mouth 2 (two) times daily before a meal.  12/23/15  Yes [provider]  QUEtiapine (SEROQUEL) 25 MG tablet Take 25 mg by mouth at bedtime.   Yes [provider]   Dg Chest 1 View  Result Date: 04/29/2019 CLINICAL DATA:  Right hip fracture. Post fall. EXAM: CHEST  1 VIEW COMPARISON:  Chest radiograph 10/17/2017 FINDINGS: Diffuse interstitial coarsening, prominent in the upper lung zones peripherally. Normal heart size and mediastinal contours. Aortic atherosclerosis. No confluent airspace disease, large pleural effusion or pneumothorax. No acute osseous abnormalities are seen. IMPRESSION: 1. Interstitial coarsening, most prominent in the periphery of the upper lung zones. Findings suggest interstitial lung disease, and have progressed from prior exam. 2.  Aortic Atherosclerosis (ICD10-I70.0). Electronically Signed   By: Keith Rake M.D.   On: 04/29/2019 22:48   Ct Head Wo Contrast  Result Date: 04/29/2019 CLINICAL DATA:  Altered level of consciousness. Unwitnessed  fall. EXAM: CT HEAD WITHOUT CONTRAST TECHNIQUE: Contiguous axial images were obtained from the base of the skull through the vertex without intravenous contrast. COMPARISON:  Head CT 10/17/2017 FINDINGS: Brain: No intracranial hemorrhage, mass effect, or midline shift. Generalized atrophy and chronic small vessel ischemia, stable from prior exam. Punctate remote lacunar infarct in left basal ganglia. No hydrocephalus. The basilar cisterns are patent. No evidence of territorial infarct or acute ischemia. Tiny incidental lipoma posterior to the pons. No extra-axial or intracranial fluid collection. Vascular: Atherosclerosis of skullbase vasculature without hyperdense vessel or abnormal calcification. Skull: No fracture or focal lesion. Sinuses/Orbits: Paranasal sinuses are clear. Chronic opacification of lower right mastoid air cells. The visualized orbits are unremarkable. Bilateral cataract resection. Other: None. IMPRESSION: 1. No acute intracranial abnormality. 2. Stable atrophy and chronic small vessel ischemia. Electronically Signed   By: Keith Rake M.D.   On: 04/29/2019 22:36   Dg Hip Unilat W Or Wo Pelvis 2-3 Views Right  Result Date: 04/29/2019 CLINICAL DATA:  Post fall with right hip pain. EXAM: DG HIP (WITH OR WITHOUT PELVIS) 2-3V RIGHT COMPARISON:  None. FINDINGS: Displaced mildly comminuted intertrochanteric right proximal femur fracture. Mild apex lateral angulation. Proximal migration of the femoral shaft. No additional fracture of the pelvis. Pubic rami are intact. Pubic symphysis and sacroiliac joints are congruent. IMPRESSION: Displaced mildly comminuted intertrochanteric right femur fracture. Electronically Signed   By: Keith Rake M.D.   On: 04/29/2019 22:46    Positive ROS: All other systems have been reviewed and were otherwise negative with the exception of those mentioned in the HPI and as above.  Physical Exam: General: Alert, no acute distress  MUSCULOSKELETAL: Right  lower extremity: Patient skin is intact overlying the right hip.  There is no erythema, ecchymosis, significant swelling and her thigh compartment is soft and compressible.  She has palpable pedal pulses and demonstrates a spontaneous dorsiflexion plantarflexion of her ankle.  Her foot is well-perfused.  Sensation cannot be assessed based on the patient's drowsiness.  Assessment: Right, displaced intertrochanteric hip fracture  Plan: I spoke with the patient's daughter, Angel Brandt, by phone.  Ms. Vertell Limber reminded me that I read on her hip years ago.  I explained to Ms. Holmes the nature of the patient's fracture.  I have recommended intramedullary fixation.  I discussed with her the details of the operation as well as the postoperative course.  We also discussed the risks and benefits of surgery.  She understands the risks include but are not limited to infection, bleeding requiring blood transfusion, nerve or blood vessel injury, joint stiffness or loss of motion, persistent pain, weakness or instability, malunion, nonunion and hardware failure and the need for further surgery. Medical risks include but are not limited to DVT and  pulmonary embolism, myocardial infarction, stroke, pneumonia, respiratory failure and death. The patient's daugther understood these risks and wished to proceed.   I have reviewed the patient's labs and radiographic studies in preparation for this case.  She is n.p.o.  Patient has been cleared for surgery by the hospitalist service.  Surgery is scheduled for later this afternoon.   Thornton Park, MD    04/30/2019 9:06 AM

## 2019-04-30 NOTE — TOC Initial Note (Signed)
Transition of Care [facility]) - Initial/Assessment Note    Patient Details  Name: Angel Brandt MRN: [id number] Date of Birth: [birth year]  [facility] CM/SW Contact:    Su Hilt, RN Phone Number: 04/30/2019, 12:00 PM  Clinical Narrative:                  Spoke to Daughter Angel Brandt, she states that she stays with the patient in the patients home.  She has A RW, grab bars along the walls and in the bathroom, a raised toilet seat, a walk in shower with a shower chair, she will not use the walker and uses the grab bars and assistance from a person.  The daughter works at [employer] and hopes to get a bed at [employer] for [employer].  I sent the FL2 to [facility] and requested there bed.     Expected Discharge Plan: [place]     Patient Goals and CMS Choice   CMS Medicare.gov Compare Post Acute Care list provided to:: Patient Represenative (must comment)(Daughter Angel Brandt) Choice offered to / list presented to : Adult Children  Expected Discharge Plan and Services Expected Discharge Plan: [place]   Discharge Planning Services: CM Consult   Living arrangements for the past 2 months: Single Family Home                 DME Arranged: N/A         HH Arranged: NA          Prior Living Arrangements/Services Living arrangements for the past 2 months: Single Family Home Lives with:: Adult Children Patient language and need for interpreter reviewed:: No Do you feel safe going back to the place where you live?: Yes        Care giver support system in place?: Yes (comment) Current home services: DME(Rails along walls, RW< Raised Toilet, shower chair and walk in shower) Criminal Activity/Legal Involvement Pertinent to Current Situation/Hospitalization: No - Comment as needed  Activities of Daily Living   ADL Screening (condition at time of admission) Patient's cognitive ability adequate to safely complete daily activities?: No Is the patient  deaf or have difficulty hearing?: Yes Does the patient have difficulty seeing, even when wearing glasses/contacts?: Yes Does the patient have difficulty concentrating, remembering, or making decisions?: Yes Patient able to express need for assistance with ADLs?: Yes Does the patient have difficulty dressing or bathing?: Yes Independently performs ADLs?: No Communication: Needs assistance Is this a change from baseline?: Pre-admission baseline Dressing (OT): Needs assistance Is this a change from baseline?: Pre-admission baseline Grooming: Needs assistance Is this a change from baseline?: Pre-admission baseline Feeding: Needs assistance Is this a change from baseline?: Pre-admission baseline Bathing: Needs assistance Is this a change from baseline?: Pre-admission baseline Toileting: Needs assistance Is this a change from baseline?: Pre-admission baseline In/Out Bed: Independent Walks in Home: Independent with device (comment) Does the patient have difficulty walking or climbing stairs?: No Weakness of Legs: None Weakness of Arms/Hands: None  Permission Sought/Granted Permission sought to share information with : Case Manager Permission granted to share information with : Yes, Verbal Permission Granted     Permission granted to share info w AGENCY: SNF  Permission granted to share info w Relationship: Daughter Angel Brandt     Emotional Assessment Appearance:: Appears stated age Attitude/Demeanor/Rapport: Inconsistent Affect (typically observed): Accepting Orientation: : Oriented to Self, Oriented to Place Alcohol / Substance Use: Not Applicable Psych Involvement: No (comment)  Admission diagnosis:  Fall [W19.XXXA] Fall, initial encounter [id number]] Closed fracture of right hip, initial encounter [facility]) [S72.001A] Patient Active Problem List   Diagnosis Date Noted  . Closed right hip fracture, initial encounter ([place]) 04/29/2019  . TIA (transient ischemic attack) 10/18/2017  .  Carcinoma of overlapping sites of right breast in female, estrogen receptor positive ([place]) 02/10/2017  . Diverticulitis 02/10/2016  . H/O malaria 02/10/2016  . Degeneration macular 02/10/2016  . 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 Gait instability 12/04/2014   PCP:  Maryland Pink, MD Pharmacy:   [facility], [place] [address] Phone: [phone] Fax: [phone]     Social Determinants of Health (SDOH) Interventions    Readmission Risk Interventions No flowsheet data found.

## 2019-04-30 NOTE — Anesthesia Preprocedure Evaluation (Signed)
Anesthesia Evaluation  Patient identified by MRN, date of birth, ID band Patient awake    Reviewed: Allergy & Precautions, H&P , NPO status , Patient's Chart, lab work & pertinent test results, reviewed documented beta blocker date and time   Airway Mallampati: II   Neck ROM: full    Dental  (+) Poor Dentition   Pulmonary neg pulmonary ROS,    Pulmonary exam normal        Cardiovascular Exercise Tolerance: Poor negative cardio ROS Normal cardiovascular exam Rhythm:regular Rate:Normal     Neuro/Psych PSYCHIATRIC DISORDERS TIAnegative neurological ROS  negative psych ROS   GI/Hepatic negative GI ROS, Neg liver ROS, GERD  Medicated,  Endo/Other  negative endocrine ROS  Renal/GU negative Renal ROS  negative genitourinary   Musculoskeletal   Abdominal   Peds  Hematology negative hematology ROS (+)   Anesthesia Other Findings Past Medical History: No date: At high risk for falls No date: Blind No date: Breast cancer [facility]) No date: Cancer [facility]) 2017: Sherran Needs syndrome No date: GERD (gastroesophageal reflux disease) No date: Hearing impaired No date: Macular degeneration No date: Malaria No date: Osteoporosis Past Surgical History: No date: BREAST LUMPECTOMY No date: BREAST SURGERY BMI    Body Mass Index: 22.64 kg/m     Reproductive/Obstetrics negative OB ROS                             Anesthesia Physical Anesthesia Plan  ASA: III  Anesthesia Plan: Spinal   Post-op Pain Management:    Induction:   PONV Risk Score and Plan:   Airway Management Planned:   Additional Equipment:   Intra-op Plan:   Post-operative Plan:   Informed Consent: I have reviewed the patients History and Physical, chart, labs and discussed the procedure including the risks, benefits and alternatives for the proposed anesthesia with the patient or authorized representative who has indicated  his/her understanding and acceptance.     Dental Advisory Given  Plan Discussed with: CRNA  Anesthesia Plan Comments:         Anesthesia Quick Evaluation

## 2019-04-30 NOTE — Transfer of Care (Signed)
Immediate Anesthesia Transfer of Care Note  Patient: Angel Brandt  Procedure(s) Performed: INTRAMEDULLARY (IM) NAIL INTERTROCHANTRIC (Right )  Patient Location: PACU  Anesthesia Type:Spinal  Level of Consciousness: sedated and patient cooperative  Airway & Oxygen Therapy: Patient Spontanous Breathing and Patient connected to nasal cannula oxygen  Post-op Assessment: Report given to RN and Post -op Vital signs reviewed and stable  Post vital signs: Reviewed and stable  Last Vitals:  Vitals Value Taken Time  BP 105/42 04/30/19 1918  Temp    Pulse 70 04/30/19 1920  Resp 14 04/30/19 1920  SpO2 93 % 04/30/19 1920  Vitals shown include unvalidated device data.  Last Pain:  Vitals:   04/30/19 1300  TempSrc:   PainSc: Asleep         Complications: No apparent anesthesia complications

## 2019-05-01 ENCOUNTER — Other Ambulatory Visit: Payer: Self-pay

## 2019-05-01 ENCOUNTER — Encounter: Payer: Self-pay | Admitting: Orthopedic Surgery

## 2019-05-01 LAB — CBC
HCT: 23.1 % — ABNORMAL LOW (ref 36.0–46.0)
Hemoglobin: 7.8 g/dL — ABNORMAL LOW (ref 12.0–15.0)
MCH: 30.5 pg (ref 26.0–34.0)
MCHC: 33.8 g/dL (ref 30.0–36.0)
MCV: 90.2 fL (ref 80.0–100.0)
Platelets: 123 10*3/uL — ABNORMAL LOW (ref 150–400)
RBC: 2.56 MIL/uL — ABNORMAL LOW (ref 3.87–5.11)
RDW: 13 % (ref 11.5–15.5)
WBC: 7.1 10*3/uL (ref 4.0–10.5)
nRBC: 0 % (ref 0.0–0.2)

## 2019-05-01 LAB — BASIC METABOLIC PANEL
Anion gap: 7 (ref 5–15)
BUN: 16 mg/dL (ref 8–23)
CO2: 24 mmol/L (ref 22–32)
Calcium: 8 mg/dL — ABNORMAL LOW (ref 8.9–10.3)
Chloride: 99 mmol/L (ref 98–111)
Creatinine, Ser: 0.91 mg/dL (ref 0.44–1.00)
GFR calc Af Amer: >60 mL/min (ref >60)
GFR calc non Af Amer: 54 mL/min — ABNORMAL LOW (ref >60)
Glucose, Bld: 129 mg/dL — ABNORMAL HIGH (ref 70–99)
Potassium: 4.5 mmol/L (ref 3.5–5.1)
Sodium: 130 mmol/L — ABNORMAL LOW (ref 135–145)

## 2019-05-01 MED ORDER — POLYETHYLENE GLYCOL 3350 17 G PO PACK
17.0000 g | PACK | Freq: Every day | ORAL | Status: DC
  Administered 2019-05-01 – 2019-05-03 (×3): 17 g via ORAL
  Filled 2019-05-01 (×3): qty 1

## 2019-05-01 NOTE — Progress Notes (Signed)
[facility] NAME: Angel Brandt    MR#:  [id number]  DATE OF BIRTH:  1924/02/28  SUBJECTIVE:  CHIEF COMPLAINT:   Chief Complaint  Patient presents with  . Fall   Patient is sent to emergency room with fall and fracture on the hip.  She was confused this morning but was not in any acute distress.  REVIEW OF SYSTEMS:  Due to confusion patient cannot give review of system ROS  DRUG ALLERGIES:   Allergies  Allergen Reactions  . Codeine Anaphylaxis  . Penicillins Anaphylaxis and Nausea And Vomiting  . Ativan [Lorazepam] Other (See Comments)    Agitation  . Codeine Sulfate Nausea And Vomiting  . Other Other (See Comments)  . Sulfa Antibiotics Other (See Comments)    unknown    VITALS:  Blood pressure (!) 139/47, pulse 62, temperature 98.2 F (36.8 C), resp. rate 16, height 5\' 1"  (1.549 m), weight 54.3 kg, SpO2 100 %.  PHYSICAL EXAMINATION:  GENERAL:  85 y.o.-year-old patient lying in the bed with no acute distress.  EYES: Pupils equal, round, reactive to light and accommodation. No scleral icterus. Extraocular muscles intact.  HEENT: Head atraumatic, normocephalic. Oropharynx and nasopharynx clear.  NECK:  Supple, no jugular venous distention. No thyroid enlargement, no tenderness.  LUNGS: Normal breath sounds bilaterally, no wheezing, rales,rhonchi or crepitation. No use of accessory muscles of respiration.  CARDIOVASCULAR: S1, S2 normal. No murmurs, rubs, or gallops.  ABDOMEN: Soft, nontender, nondistended. Bowel sounds present. No organomegaly or mass.  EXTREMITIES: No pedal edema, cyanosis, or clubbing.  NEUROLOGIC: Cranial nerves II through XII are intact. Muscle strength 3/5 in all extremities. Sensation intact. Gait not checked.  PSYCHIATRIC: The patient is alert and oriented x 0.  SKIN: No obvious rash, lesion, or ulcer.   Physical Exam LABORATORY PANEL:   CBC Recent Labs  Lab 05/01/19 0501  WBC 7.1  HGB 7.8*   HCT 23.1*  PLT 123*   ------------------------------------------------------------------------------------------------------------------  Chemistries  Recent Labs  Lab 04/29/19 2204  05/01/19 0501  NA 128*   < > 130*  K 4.2   < > 4.5  CL 94*   < > 99  CO2 24   < > 24  GLUCOSE 99   < > 129*  BUN 14   < > 16  CREATININE 0.75   < > 0.91  CALCIUM 8.8*   < > 8.0*  AST 20  --   --   ALT 15  --   --   ALKPHOS 110  --   --   BILITOT 0.5  --   --    < > = values in this interval not displayed.   ------------------------------------------------------------------------------------------------------------------  Cardiac Enzymes No results for input(s): TROPONINI in the last 168 hours. ------------------------------------------------------------------------------------------------------------------  RADIOLOGY:  Dg Chest 1 View  Result Date: 04/29/2019 CLINICAL DATA:  Right hip fracture. Post fall. EXAM: CHEST  1 VIEW COMPARISON:  Chest radiograph 10/17/2017 FINDINGS: Diffuse interstitial coarsening, prominent in the upper lung zones peripherally. Normal heart size and mediastinal contours. Aortic atherosclerosis. No confluent airspace disease, large pleural effusion or pneumothorax. No acute osseous abnormalities are seen. IMPRESSION: 1. Interstitial coarsening, most prominent in the periphery of the upper lung zones. Findings suggest interstitial lung disease, and have progressed from prior exam. 2.  Aortic Atherosclerosis (ICD10-I70.0). Electronically Signed   By: Keith Rake M.D.   On: 04/29/2019 22:48   Ct Head Wo Contrast  Result Date: 04/29/2019  CLINICAL DATA:  Altered level of consciousness. Unwitnessed fall. EXAM: CT HEAD WITHOUT CONTRAST TECHNIQUE: Contiguous axial images were obtained from the base of the skull through the vertex without intravenous contrast. COMPARISON:  Head CT 10/17/2017 FINDINGS: Brain: No intracranial hemorrhage, mass effect, or midline shift. Generalized  atrophy and chronic small vessel ischemia, stable from prior exam. Punctate remote lacunar infarct in left basal ganglia. No hydrocephalus. The basilar cisterns are patent. No evidence of territorial infarct or acute ischemia. Tiny incidental lipoma posterior to the pons. No extra-axial or intracranial fluid collection. Vascular: Atherosclerosis of skullbase vasculature without hyperdense vessel or abnormal calcification. Skull: No fracture or focal lesion. Sinuses/Orbits: Paranasal sinuses are clear. Chronic opacification of lower right mastoid air cells. The visualized orbits are unremarkable. Bilateral cataract resection. Other: None. IMPRESSION: 1. No acute intracranial abnormality. 2. Stable atrophy and chronic small vessel ischemia. Electronically Signed   By: Keith Rake M.D.   On: 04/29/2019 22:36   Dg Hip Port Unilat With Pelvis 1v Right  Result Date: 04/30/2019 CLINICAL DATA:  Status post ORIF of right femoral fracture EXAM: DG HIP (WITH OR WITHOUT PELVIS) 1V PORT RIGHT COMPARISON:  Intraoperative films from earlier in the same day. FINDINGS: Right intratrochanteric fracture is again identified with the fracture fragments in near anatomic alignment. Medullary rod and compression screw are noted. No acute bony abnormality is seen. IMPRESSION: ORIF of proximal right femoral fracture. Electronically Signed   By: Inez Catalina M.D.   On: 04/30/2019 20:01   Dg Hip Operative Unilat W Or W/o Pelvis Right  Result Date: 04/30/2019 CLINICAL DATA:  ORIF of right hip fracture EXAM: OPERATIVE RIGHT HIP WITH PELVIS COMPARISON:  04/29/2019 FLUOROSCOPY TIME:  Radiation Exposure Index (as provided by the fluoroscopic device): Not available If the device does not provide the exposure index: Fluoroscopy Time:  1 minutes 3 seconds Number of Acquired Images:  6 FINDINGS: Initial images demonstrate the intratrochanteric fracture to be reduced. Subsequent medullary rod placement and compression screw placement is seen.  The fracture fragments are in near anatomic alignment. IMPRESSION: ORIF of proximal right femoral fracture. Electronically Signed   By: Inez Catalina M.D.   On: 04/30/2019 19:18   Dg Hip Unilat W Or Wo Pelvis 2-3 Views Right  Result Date: 04/29/2019 CLINICAL DATA:  Post fall with right hip pain. EXAM: DG HIP (WITH OR WITHOUT PELVIS) 2-3V RIGHT COMPARISON:  None. FINDINGS: Displaced mildly comminuted intertrochanteric right proximal femur fracture. Mild apex lateral angulation. Proximal migration of the femoral shaft. No additional fracture of the pelvis. Pubic rami are intact. Pubic symphysis and sacroiliac joints are congruent. IMPRESSION: Displaced mildly comminuted intertrochanteric right femur fracture. Electronically Signed   By: Keith Rake M.D.   On: 04/29/2019 22:46    ASSESSMENT AND PLAN:   Active Problems:   Closed right hip fracture, initial encounter (HCC)   Protein-calorie malnutrition, severe  1.  Right hip fracture - Dr. Lisette Grinder did IM nail surgery 04/30/19 - Pain is being managed with IV analgesic - Need Rehab placement.  2.  Acute right lower extremity pain - We will manage with IV analgesic  3.  GERD - IV PPI prophylaxis  4.  Acute blood loss anemia Due to surgery, continue to monitor hemoglobin.  5.  Dementia This is patient's baseline.  DVT prophylaxis with SCDs and PPI prophylaxis initiated  I spoke to patient's daughter on phone and updated her about the treatment plan.  All the records are reviewed and case discussed with Care Management/Social  Workerr. Management plans discussed with the patient, family and they are in agreement.  CODE STATUS: full.  TOTAL TIME TAKING CARE OF THIS PATIENT: 35 minutes.     POSSIBLE D/C IN 1-2 DAYS, DEPENDING ON CLINICAL CONDITION.   Vaughan Basta M.D on 05/01/2019   Between 7am to 6pm - Pager - [phone]  After 6pm go to [email or web address] - password [email or web address] [facility]  Office   [phone]  CC: Primary care physician; [place] Pink, MD  Note: This dictation was prepared with Dragon dictation along with smaller phrase technology. Any transcriptional errors that result from this process are unintentional.

## 2019-05-01 NOTE — Progress Notes (Signed)
Subjective:  POD #1 s/p IM fixation for right IT hip fracture.   Patient has advanced dementia.  She is unable to provide an accurate history.  She denies significant right hip pain.   Objective:   VITALS:   Vitals:   05/01/19 0215 05/01/19 0311 05/01/19 0712 05/01/19 1125  BP: (!) 155/48 (!) 123/40 (!) 127/44 (!) 139/47  Pulse: 69 72 67 62  Resp: 16 16 16 16   Temp: (!) 97.3 F (36.3 C) 97.6 F (36.4 C) 98.2 F (36.8 C) 98.2 F (36.8 C)  TempSrc:      SpO2: 100% 100% 100% 100%  Weight:      Height:        PHYSICAL EXAM: Right lower extremity: Neurovascular intact Intact pulses distally Spontaneous Dorsiflexion/Plantar flexion observed Incision: dressing C/D/I No cellulitis present Compartment soft  LABS  Results for orders placed or performed during the hospital encounter of 04/29/19 (from the past 24 hour(s))  CBC     Status: Abnormal   Collection Time: 05/01/19  5:01 AM  Result Value Ref Range   WBC 7.1 4.0 - 10.5 K/uL   RBC 2.56 (L) 3.87 - 5.11 MIL/uL   Hemoglobin 7.8 (L) 12.0 - 15.0 g/dL   HCT 23.1 (L) 36.0 - 46.0 %   MCV 90.2 80.0 - 100.0 fL   MCH 30.5 26.0 - 34.0 pg   MCHC 33.8 30.0 - 36.0 g/dL   RDW 13.0 11.5 - 15.5 %   Platelets 123 (L) 150 - 400 K/uL   nRBC 0.0 0.0 - 0.2 %  Basic metabolic panel     Status: Abnormal   Collection Time: 05/01/19  5:01 AM  Result Value Ref Range   Sodium 130 (L) 135 - 145 mmol/L   Potassium 4.5 3.5 - 5.1 mmol/L   Chloride 99 98 - 111 mmol/L   CO2 24 22 - 32 mmol/L   Glucose, Bld 129 (H) 70 - 99 mg/dL   BUN 16 8 - 23 mg/dL   Creatinine, Ser 0.91 0.44 - 1.00 mg/dL   Calcium 8.0 (L) 8.9 - 10.3 mg/dL   GFR calc non Af Amer 54 (L) >60 mL/min   GFR calc Af Amer >60 >60 mL/min   Anion gap 7 5 - 15    Dg Chest 1 View  Result Date: 04/29/2019 CLINICAL DATA:  Right hip fracture. Post fall. EXAM: CHEST  1 VIEW COMPARISON:  Chest radiograph 10/17/2017 FINDINGS: Diffuse interstitial coarsening, prominent in the upper lung  zones peripherally. Normal heart size and mediastinal contours. Aortic atherosclerosis. No confluent airspace disease, large pleural effusion or pneumothorax. No acute osseous abnormalities are seen. IMPRESSION: 1. Interstitial coarsening, most prominent in the periphery of the upper lung zones. Findings suggest interstitial lung disease, and have progressed from prior exam. 2.  Aortic Atherosclerosis (ICD10-I70.0). Electronically Signed   By: Keith Rake M.D.   On: 04/29/2019 22:48   Ct Head Wo Contrast  Result Date: 04/29/2019 CLINICAL DATA:  Altered level of consciousness. Unwitnessed fall. EXAM: CT HEAD WITHOUT CONTRAST TECHNIQUE: Contiguous axial images were obtained from the base of the skull through the vertex without intravenous contrast. COMPARISON:  Head CT 10/17/2017 FINDINGS: Brain: No intracranial hemorrhage, mass effect, or midline shift. Generalized atrophy and chronic small vessel ischemia, stable from prior exam. Punctate remote lacunar infarct in left basal ganglia. No hydrocephalus. The basilar cisterns are patent. No evidence of territorial infarct or acute ischemia. Tiny incidental lipoma posterior to the pons. No extra-axial or intracranial fluid collection.  Vascular: Atherosclerosis of skullbase vasculature without hyperdense vessel or abnormal calcification. Skull: No fracture or focal lesion. Sinuses/Orbits: Paranasal sinuses are clear. Chronic opacification of lower right mastoid air cells. The visualized orbits are unremarkable. Bilateral cataract resection. Other: None. IMPRESSION: 1. No acute intracranial abnormality. 2. Stable atrophy and chronic small vessel ischemia. Electronically Signed   By: Keith Rake M.D.   On: 04/29/2019 22:36   Dg Hip Port Unilat With Pelvis 1v Right  Result Date: 04/30/2019 CLINICAL DATA:  Status post ORIF of right femoral fracture EXAM: DG HIP (WITH OR WITHOUT PELVIS) 1V PORT RIGHT COMPARISON:  Intraoperative films from earlier in the same  day. FINDINGS: Right intratrochanteric fracture is again identified with the fracture fragments in near anatomic alignment. Medullary rod and compression screw are noted. No acute bony abnormality is seen. IMPRESSION: ORIF of proximal right femoral fracture. Electronically Signed   By: Inez Catalina M.D.   On: 04/30/2019 20:01   Dg Hip Operative Unilat W Or W/o Pelvis Right  Result Date: 04/30/2019 CLINICAL DATA:  ORIF of right hip fracture EXAM: OPERATIVE RIGHT HIP WITH PELVIS COMPARISON:  04/29/2019 FLUOROSCOPY TIME:  Radiation Exposure Index (as provided by the fluoroscopic device): Not available If the device does not provide the exposure index: Fluoroscopy Time:  1 minutes 3 seconds Number of Acquired Images:  6 FINDINGS: Initial images demonstrate the intratrochanteric fracture to be reduced. Subsequent medullary rod placement and compression screw placement is seen. The fracture fragments are in near anatomic alignment. IMPRESSION: ORIF of proximal right femoral fracture. Electronically Signed   By: Inez Catalina M.D.   On: 04/30/2019 19:18   Dg Hip Unilat W Or Wo Pelvis 2-3 Views Right  Result Date: 04/29/2019 CLINICAL DATA:  Post fall with right hip pain. EXAM: DG HIP (WITH OR WITHOUT PELVIS) 2-3V RIGHT COMPARISON:  None. FINDINGS: Displaced mildly comminuted intertrochanteric right proximal femur fracture. Mild apex lateral angulation. Proximal migration of the femoral shaft. No additional fracture of the pelvis. Pubic rami are intact. Pubic symphysis and sacroiliac joints are congruent. IMPRESSION: Displaced mildly comminuted intertrochanteric right femur fracture. Electronically Signed   By: Keith Rake M.D.   On: 04/29/2019 22:46    Assessment/Plan: 1 Day Post-Op   Active Problems:   Closed right hip fracture, initial encounter ([place])   Protein-calorie malnutrition, severe  Continue PT as patient can participate.  Patient will complete 24 as opposed of antibiotics.  Continue current  pain management.  Patient will likely require skilled nursing facility upon discharge from the hospital.     Thornton Park , MD 05/01/2019, 1:44 PM

## 2019-05-01 NOTE — Progress Notes (Signed)
PT Cancellation Note  Patient Details Name: Angel Brandt MRN: [id number] DOB: January 16, 1924   Cancelled Treatment:    Reason Eval/Treat Not Completed: Fatigue/lethargy limiting ability to participate(Chart reviewed, evaluation attempted. Pt asleep upon entry. Nursing reports patient has been very lethargic most of morning, awaking partially only for taking meds, not waking when vitals are taken. Will try back when pt is able to participate.)   10:58 AM, 05/01/19 Etta Grandchild, PT, DPT Physical Therapist - [facility]  [phone] ([place])    Buccola,Allan C 05/01/2019, 10:58 AM

## 2019-05-01 NOTE — Anesthesia Postprocedure Evaluation (Signed)
Anesthesia Post Note  Patient: Angel Brandt  Procedure(s) Performed: INTRAMEDULLARY (IM) NAIL INTERTROCHANTRIC (Right )  Patient location during evaluation: Nursing Unit Anesthesia Type: Spinal Level of consciousness: confused and awake Pain management: pain level controlled Vital Signs Assessment: post-procedure vital signs reviewed and stable Respiratory status: spontaneous breathing Cardiovascular status: stable Postop Assessment: no headache, no backache, patient able to bend at knees, adequate PO intake, able to ambulate and no apparent nausea or vomiting Anesthetic complications: no     Last Vitals:  Vitals:   05/01/19 0311 05/01/19 0712  BP: (!) 123/40 (!) 127/44  Pulse: 72 67  Resp: 16 16  Temp: 36.4 C 36.8 C  SpO2: 100% 100%    Last Pain:  Vitals:   05/01/19 0050  TempSrc:   PainSc: Yolanda Manges

## 2019-05-01 NOTE — Progress Notes (Signed)
PT Cancellation Note  Patient Details Name: Angel Brandt MRN: [id number] DOB: 11-06-1923   Cancelled Treatment:    Reason Eval/Treat Not Completed: Fatigue/lethargy limiting ability to participate(Attempted to see patient again, stopping by room 3 times this afternoon. Pt sound asleep each time appears to not have moved at all over the past couple hours. Pt does not rouse to gentle tactile stimulus. Will attempt eval again at later date/time.)  3:34 PM, 05/01/19 Etta Grandchild, PT, DPT Physical Therapist - [facility]  [phone] ([place])   Abernathy C 05/01/2019, 3:34 PM

## 2019-05-02 LAB — CBC
HCT: 24.8 % — ABNORMAL LOW (ref 36.0–46.0)
Hemoglobin: 8.4 g/dL — ABNORMAL LOW (ref 12.0–15.0)
MCH: 30.4 pg (ref 26.0–34.0)
MCHC: 33.9 g/dL (ref 30.0–36.0)
MCV: 89.9 fL (ref 80.0–100.0)
Platelets: 146 10*3/uL — ABNORMAL LOW (ref 150–400)
RBC: 2.76 MIL/uL — ABNORMAL LOW (ref 3.87–5.11)
RDW: 13 % (ref 11.5–15.5)
WBC: 6.5 10*3/uL (ref 4.0–10.5)
nRBC: 0 % (ref 0.0–0.2)

## 2019-05-02 NOTE — Progress Notes (Signed)
Physical Therapy Treatment Patient Details Name: Angel Brandt MRN: [id number] DOB: [birth year] Today's Date: 05/02/2019    History of Present Illness 85 y/o female s/p fall with R Hip fx and subsequent IM fixation.      PT Comments    Pt with limited participation this afternoon, repeatedly needing cues as to where she is, that she broke her hip and needed surgery, etc.  She did not deal well with even gentle PROM exercises calling out in pain with almost all tasks.  Pt did not get aggressive, but between confusion and pain she was very questioning of everything and clearly uncomfortable.  On arrival she was fidgeting with gown and had taken tele-leads off.  Per both sessions this date but appears more appropriate for QD PT, POC adjusted accordingly.   Follow Up Recommendations  SNF     Equipment Recommendations  Rolling walker with 5" wheels    Recommendations for Other Services       Precautions / Restrictions Precautions Precautions: Fall Restrictions Weight Bearing Restrictions: Yes RLE Weight Bearing: Weight bearing as tolerated    Mobility  Bed Mobility    General bed mobility comments: Deferred this afternoon as she was calling out in severe pain with even minimal R LE exercises and was more quick to have aggressive/questioning confusion this session  Transfers     General transfer comment: Pt able to give some minimal assist to get to standing, but ultimately needed amost total assist  Ambulation/Gait      General Gait Details: unable, unsafe, too pain limited   Stairs     Wheelchair Mobility    Modified Rankin (Stroke Patients Only)       Balance Overall balance assessment: needs assist, deferred sitting tasks this session 2/2 pain and confusion             Cognition Arousal/Alertness: Awake/alert Behavior During Therapy: Restless;Anxious Overall Cognitive Status: History of cognitive impairments - at baseline                                  General Comments: unsure of true baseline, however she repeatedly needed reminders that she even had a fall, fracture, was in the hospital, etc      Exercises Total Joint Exercises Ankle Circles/Pumps: AROM;20 reps Quad Sets: AROM;15 reps Short Arc Quad: AAROM;PROM;10 reps(more pain this PM session) Heel Slides: 5 reps;AAROM(c/o significant discomfort) Hip ABduction/ADduction: PROM;AAROM;10 reps(pt tolerates almost no ROM with this exercise 2/2 pain)    General Comments General comments (skin integrity, edema, etc.): On arrival pt distractedly pulling on gown and had taken tele-leads off (nursing notified)      Pertinent Vitals/Pain Pain Assessment: Faces Faces Pain Scale: Hurts whole lot Pain Location: indicates very little pain at rest, calling out in severe pain with any R LE movement    Home Living Family/patient expects to be discharged to:: Skilled nursing facility Living Arrangements: Children                  Prior Function Level of Independence: (unable to answer, 2 years ago she in-home funiture cruising)          PT Goals (current goals can now be found in the care plan section) Acute Rehab PT Goals Patient Stated Goal: stop hurting PT Goal Formulation: Patient unable to participate in goal setting Time For Goal Achievement: 05/16/19 Potential to Achieve Goals: Fair Progress towards PT goals:  Not progressing toward goals - comment(Pt c/o pain with almost all tasks, very confused)    Frequency    7X/week      PT Plan Frequency needs to be updated    Co-evaluation              AM-PAC PT "6 Clicks" Mobility   Outcome Measure  Help needed turning from your back to your side while in a flat bed without using bedrails?: Total Help needed moving from lying on your back to sitting on the side of a flat bed without using bedrails?: Total Help needed moving to and from a bed to a chair (including a wheelchair)?: Total Help needed  standing up from a chair using your arms (e.g., wheelchair or bedside chair)?: Total Help needed to walk in hospital room?: Total Help needed climbing 3-5 steps with a railing? : Total 6 Click Score: 6    End of Session Equipment Utilized During Treatment: Gait belt Activity Tolerance: No increased pain Patient left: with call bell/phone within reach;with bed alarm set Nurse Communication: Mobility status PT Visit Diagnosis: Muscle weakness (generalized) (M62.81);Difficulty in walking, not elsewhere classified (R26.2)     Time: 6553-7482 PT Time Calculation (min) (ACUTE ONLY): 15 min  Charges:  $Therapeutic Exercise: 8-22 mins                    Kreg Shropshire, DPT 05/02/2019, 3:39 PM

## 2019-05-02 NOTE — Care Management Important Message (Signed)
Important Message  Patient Details  Name: Angel Brandt MRN: [id number] Date of Birth: 1923-10-30   Medicare Important Message Given:  Yes     [name] A Yamil Dougher 05/02/2019, 11:25 AM

## 2019-05-02 NOTE — Progress Notes (Signed)
Patient had pulled out her IV prior to shift change. Call placed to MD and Dr Jannifer Franklin stated to keep out as patient is to discharged to [facility] in AM.

## 2019-05-02 NOTE — Progress Notes (Signed)
Subjective:  POD #2 s/p intramedullary fixation for right intertrochanteric hip fracture.   Patient more alert today.  She remains confused but denies any significant right hip pain.  Patient states she has to use the bathroom.    Objective:   VITALS:   Vitals:   05/01/19 1125 05/01/19 1758 05/01/19 2257 05/02/19 0728  BP: (!) 139/47 (!) 166/44 (!) 134/44 98/74  Pulse: 62 86 71 70  Resp: 16 16 18    Temp: 98.2 F (36.8 C) 98.7 F (37.1 C) 98 F (36.7 C) 98.8 F (37.1 C)  TempSrc:  Axillary Oral Oral  SpO2: 100% 100% 93% 99%  Weight:      Height:        PHYSICAL EXAM: Right lower extremity Neurovascular intact Sensation intact distally Intact pulses distally Dorsiflexion/Plantar flexion intact Incision: scant drainage No cellulitis present Compartment soft  LABS  Results for orders placed or performed during the hospital encounter of 04/29/19 (from the past 24 hour(s))  CBC     Status: Abnormal   Collection Time: 05/02/19  8:48 AM  Result Value Ref Range   WBC 6.5 4.0 - 10.5 K/uL   RBC 2.76 (L) 3.87 - 5.11 MIL/uL   Hemoglobin 8.4 (L) 12.0 - 15.0 g/dL   HCT 24.8 (L) 36.0 - 46.0 %   MCV 89.9 80.0 - 100.0 fL   MCH 30.4 26.0 - 34.0 pg   MCHC 33.9 30.0 - 36.0 g/dL   RDW 13.0 11.5 - 15.5 %   Platelets 146 (L) 150 - 400 K/uL   nRBC 0.0 0.0 - 0.2 %    Dg Hip Port Unilat With Pelvis 1v Right  Result Date: 04/30/2019 CLINICAL DATA:  Status post ORIF of right femoral fracture EXAM: DG HIP (WITH OR WITHOUT PELVIS) 1V PORT RIGHT COMPARISON:  Intraoperative films from earlier in the same day. FINDINGS: Right intratrochanteric fracture is again identified with the fracture fragments in near anatomic alignment. Medullary rod and compression screw are noted. No acute bony abnormality is seen. IMPRESSION: ORIF of proximal right femoral fracture. Electronically Signed   By: Inez Catalina M.D.   On: 04/30/2019 20:01   Dg Hip Operative Unilat W Or W/o Pelvis Right  Result Date:  04/30/2019 CLINICAL DATA:  ORIF of right hip fracture EXAM: OPERATIVE RIGHT HIP WITH PELVIS COMPARISON:  04/29/2019 FLUOROSCOPY TIME:  Radiation Exposure Index (as provided by the fluoroscopic device): Not available If the device does not provide the exposure index: Fluoroscopy Time:  1 minutes 3 seconds Number of Acquired Images:  6 FINDINGS: Initial images demonstrate the intratrochanteric fracture to be reduced. Subsequent medullary rod placement and compression screw placement is seen. The fracture fragments are in near anatomic alignment. IMPRESSION: ORIF of proximal right femoral fracture. Electronically Signed   By: Inez Catalina M.D.   On: 04/30/2019 19:18    Assessment/Plan: 2 Days Post-Op   Active Problems:   Closed right hip fracture, initial encounter ([place])   Protein-calorie malnutrition, severe   Patient's vital signs are stable.  Her hemoglobin and hematocrit are stable.  Patient is more alert today.  Continue physical therapy as the patient can tolerate.  Patient is stable from orthopedic standpoint he Amend be discharged to SNF when cleared by medicine.  Patient is weightbearing as tolerated on the right lower extremity.  She will follow-up in my office in 14 days for wound check, staple removal and x-ray.  Patient should remain on Lovenox 40 mg subQ daily for DVT prophylaxis x4 weeks.  Thornton Park , MD 05/02/2019, 11:42 AM

## 2019-05-02 NOTE — Evaluation (Signed)
Physical Therapy Evaluation Patient Details Name: Angel Brandt MRN: [id number] DOB: 1924/08/18 Today's Date: 05/02/2019   History of Present Illness  85 y/o female s/p fall with R Hip fx and subsequent IM fixation.    Clinical Impression  Pt confused t/o the session and given how hard of hearing she is session was somewhat difficult.  She needed very frequent reminders as to why she is even here, very limited AROM in R LE, AAROM/PROM painful, but at times was able to show some minimal effort.  She struggled with transitions to/from sitting needing a lot of assist, showed little ability to assist with standing, ultimately needing heavy assist.    Follow Up Recommendations SNF    Equipment Recommendations  Rolling walker with 5" wheels    Recommendations for Other Services       Precautions / Restrictions Precautions Precautions: Fall Restrictions Weight Bearing Restrictions: Yes RLE Weight Bearing: Weight bearing as tolerated      Mobility  Bed Mobility Overal bed mobility: Needs Assistance Bed Mobility: Supine to Sit;Sit to Supine     Supine to sit: Max assist;Mod assist Sit to supine: Max assist   General bed mobility comments: Pt showed some effort with getting to sitting but only after repeated cuing and assist to get to EOB, pain t/o transition  Transfers Overall transfer level: Needs assistance Equipment used: Rolling walker (2 wheeled) Transfers: Sit to/from Stand Sit to Stand: Max assist         General transfer comment: Pt able to give some minimal assist to get to standing, but ultimately needed amost total assist  Ambulation/Gait             General Gait Details: unable, unsafe, too pain limited  Stairs            Wheelchair Mobility    Modified Rankin (Stroke Patients Only)       Balance Overall balance assessment: Independent                                           Pertinent Vitals/Pain Pain Assessment: (no  pain at rest, c/o severe pain with any R LE movement)    Home Living Family/patient expects to be discharged to:: Skilled nursing facility Living Arrangements: Children                    Prior Function Level of Independence: (unable to answer, 2 years ago she in-home funiture cruising)               Hand Dominance        Extremity/Trunk Assessment   Upper Extremity Assessment Upper Extremity Assessment: Generalized weakness    Lower Extremity Assessment Lower Extremity Assessment: Generalized weakness(c/o considerable pain with all R LE movement)       Communication   Communication: HOH  Cognition Arousal/Alertness: Awake/alert Behavior During Therapy: Restless;Anxious Overall Cognitive Status: History of cognitive impairments - at baseline                                 General Comments: unsure of true baseline, however she repeatedly needed reminders that she even had a fall, fracture, was in the hospital, etc      General Comments      Exercises Total Joint Exercises Ankle Circles/Pumps: AROM;10 reps Quad  Sets: AROM;10 reps Short Arc Quad: AROM;AAROM;10 reps Heel Slides: 5 reps;AAROM(c/o pain t/o effort) Hip ABduction/ADduction: PROM;10 reps(calls out in severe pain with all P/AAROM attempts)   Assessment/Plan    PT Assessment Patient needs continued PT services  PT Problem List Decreased range of motion;Decreased strength;Decreased activity tolerance;Decreased balance;Decreased mobility;Decreased cognition;Decreased coordination;Decreased safety awareness;Decreased knowledge of use of DME;Pain       PT Treatment Interventions DME instruction;Gait training;Functional mobility training;Therapeutic activities;Balance training;Therapeutic exercise;Neuromuscular re-education;Cognitive remediation;Patient/family education    PT Goals (Current goals can be found in the Care Plan section)  Acute Rehab PT Goals Patient Stated Goal: stop  hurting PT Goal Formulation: Patient unable to participate in goal setting Time For Goal Achievement: 05/16/19 Potential to Achieve Goals: Fair    Frequency BID   Barriers to discharge        Co-evaluation               AM-PAC PT "6 Clicks" Mobility  Outcome Measure Help needed turning from your back to your side while in a flat bed without using bedrails?: Total Help needed moving from lying on your back to sitting on the side of a flat bed without using bedrails?: Total Help needed moving to and from a bed to a chair (including a wheelchair)?: Total Help needed standing up from a chair using your arms (e.g., wheelchair or bedside chair)?: Total Help needed to walk in hospital room?: Total Help needed climbing 3-5 steps with a railing? : Total 6 Click Score: 6    End of Session Equipment Utilized During Treatment: Gait belt Activity Tolerance: No increased pain Patient left: with call bell/phone within reach;with bed alarm set Nurse Communication: Mobility status PT Visit Diagnosis: Muscle weakness (generalized) (M62.81);Difficulty in walking, not elsewhere classified (R26.2)    Time: 7741-4239 PT Time Calculation (min) (ACUTE ONLY): 21 min   Charges:   PT Evaluation $PT Eval Low Complexity: 1 Low PT Treatments $Therapeutic Exercise: 8-22 mins        Kreg Shropshire, DPT 05/02/2019, 1:27 PM

## 2019-05-02 NOTE — Progress Notes (Signed)
[facility] [place] NAME: Angel Brandt    MR#:  [id number]  DATE OF BIRTH:  1924-08-06  SUBJECTIVE:  CHIEF COMPLAINT:   Chief Complaint  Patient presents with  . Fall   Patient is sent to emergency room with fall and fracture on the hip.  She was confused this morning but was not in any acute distress.  REVIEW OF SYSTEMS:  Due to confusion patient cannot give review of system ROS  DRUG ALLERGIES:   Allergies  Allergen Reactions  . Codeine Anaphylaxis  . Penicillins Anaphylaxis and Nausea And Vomiting  . Ativan [Lorazepam] Other (See Comments)    Agitation  . Codeine Sulfate Nausea And Vomiting  . Other Other (See Comments)  . Sulfa Antibiotics Other (See Comments)    unknown    VITALS:  Blood pressure (!) 173/65, pulse 85, temperature 99.2 F (37.3 C), temperature source Oral, resp. rate 18, height 5\' 1"  (1.549 m), weight 54.3 kg, SpO2 95 %.  PHYSICAL EXAMINATION:  GENERAL:  85 y.o.-year-old patient lying in the bed with no acute distress.  EYES: Pupils equal, round, reactive to light and accommodation. No scleral icterus. Extraocular muscles intact.  HEENT: Head atraumatic, normocephalic. Oropharynx and nasopharynx clear.  NECK:  Supple, no jugular venous distention. No thyroid enlargement, no tenderness.  LUNGS: Normal breath sounds bilaterally, no wheezing, rales,rhonchi or crepitation. No use of accessory muscles of respiration.  CARDIOVASCULAR: S1, S2 normal. No murmurs, rubs, or gallops.  ABDOMEN: Soft, nontender, nondistended. Bowel sounds present. No organomegaly or mass.  EXTREMITIES: No pedal edema, cyanosis, or clubbing.  NEUROLOGIC: Cranial nerves II through XII are intact. Muscle strength 3/5 in all extremities. Sensation intact. Gait not checked.  PSYCHIATRIC: The patient is alert and oriented x 1.  SKIN: No obvious rash, lesion, or ulcer.   Physical Exam LABORATORY PANEL:   CBC Recent Labs  Lab  05/02/19 0848  WBC 6.5  HGB 8.4*  HCT 24.8*  PLT 146*   ------------------------------------------------------------------------------------------------------------------  Chemistries  Recent Labs  Lab 04/29/19 2204  05/01/19 0501  NA 128*   < > 130*  K 4.2   < > 4.5  CL 94*   < > 99  CO2 24   < > 24  GLUCOSE 99   < > 129*  BUN 14   < > 16  CREATININE 0.75   < > 0.91  CALCIUM 8.8*   < > 8.0*  AST 20  --   --   ALT 15  --   --   ALKPHOS 110  --   --   BILITOT 0.5  --   --    < > = values in this interval not displayed.   ------------------------------------------------------------------------------------------------------------------  Cardiac Enzymes No results for input(s): TROPONINI in the last 168 hours. ------------------------------------------------------------------------------------------------------------------  RADIOLOGY:  Dg Hip Port Unilat With Pelvis 1v Right  Result Date: 04/30/2019 CLINICAL DATA:  Status post ORIF of right femoral fracture EXAM: DG HIP (WITH OR WITHOUT PELVIS) 1V PORT RIGHT COMPARISON:  Intraoperative films from earlier in the same day. FINDINGS: Right intratrochanteric fracture is again identified with the fracture fragments in near anatomic alignment. Medullary rod and compression screw are noted. No acute bony abnormality is seen. IMPRESSION: ORIF of proximal right femoral fracture. Electronically Signed   By: Inez Catalina M.D.   On: 04/30/2019 20:01   Dg Hip Operative Unilat W Or W/o Pelvis Right  Result Date: 04/30/2019 CLINICAL DATA:  ORIF of  right hip fracture EXAM: OPERATIVE RIGHT HIP WITH PELVIS COMPARISON:  04/29/2019 FLUOROSCOPY TIME:  Radiation Exposure Index (as provided by the fluoroscopic device): Not available If the device does not provide the exposure index: Fluoroscopy Time:  1 minutes 3 seconds Number of Acquired Images:  6 FINDINGS: Initial images demonstrate the intratrochanteric fracture to be reduced. Subsequent medullary  rod placement and compression screw placement is seen. The fracture fragments are in near anatomic alignment. IMPRESSION: ORIF of proximal right femoral fracture. Electronically Signed   By: Inez Catalina M.D.   On: 04/30/2019 19:18    ASSESSMENT AND PLAN:   Active Problems:   Closed right hip fracture, initial encounter ([facility])   Protein-calorie malnutrition, severe  1.  Right hip fracture - Dr. Lisette Grinder did IM nail surgery 04/30/19 - Pain is being managed with IV and oral analgesic - Need Rehab placement.  2.  Acute right lower extremity pain -  As needed analgesic  3.  GERD - IV PPI prophylaxis  4.  Acute blood loss anemia Due to surgery, continue to monitor hemoglobin.  Stable, no need to transfuse.  5.  Dementia This is patient's baseline.  DVT prophylaxis with SCDs and PPI prophylaxis initiated  I spoke to patient's daughter on phone and updated her about the treatment plan.  All the records are reviewed and case discussed with Care Management/Social Workerr. Management plans discussed with the patient, family and they are in agreement.  CODE STATUS: full.  TOTAL TIME TAKING CARE OF THIS PATIENT: 35 minutes.     POSSIBLE D/C IN 1-2 DAYS, DEPENDING ON CLINICAL CONDITION.   Vaughan Basta M.D on 05/02/2019   Between 7am to 6pm - Pager - [phone]  After 6pm go to [email or web address] - password [email or web address] [facility]  Office  [phone]  CC: Primary care physician; [place] Pink, MD  Note: This dictation was prepared with Dragon dictation along with smaller phrase technology. Any transcriptional errors that result from this process are unintentional.

## 2019-05-03 LAB — SARS CORONAVIRUS 2 BY RT PCR (HOSPITAL ORDER, PERFORMED IN ~~LOC~~ HOSPITAL LAB): SARS Coronavirus 2: NEGATIVE

## 2019-05-03 MED ORDER — HYDROCODONE-ACETAMINOPHEN 5-325 MG PO TABS: 1.0000 | ORAL_TABLET | Freq: Four times a day (QID) | ORAL | 0 refills | Status: DC | PRN

## 2019-05-03 MED ORDER — ENOXAPARIN SODIUM 40 MG/0.4ML ~~LOC~~ SOLN: 40.0000 mg | SUBCUTANEOUS | 0 refills | Status: DC

## 2019-05-03 MED ORDER — FERROUS SULFATE 325 (65 FE) MG PO TABS: 325.0000 mg | ORAL_TABLET | Freq: Three times a day (TID) | ORAL | 3 refills | Status: DC

## 2019-05-03 MED ORDER — POLYETHYLENE GLYCOL 3350 17 G PO PACK: 17.0000 g | PACK | Freq: Every day | ORAL | 0 refills | Status: DC

## 2019-05-03 MED ORDER — ENSURE ENLIVE PO LIQD: 237.0000 mL | Freq: Two times a day (BID) | ORAL | 12 refills | Status: DC

## 2019-05-03 MED ORDER — DOCUSATE SODIUM 100 MG PO CAPS: 100.0000 mg | ORAL_CAPSULE | Freq: Two times a day (BID) | ORAL | 0 refills | Status: DC

## 2019-05-03 MED ORDER — ADULT MULTIVITAMIN W/MINERALS CH: 1.0000 | ORAL_TABLET | Freq: Every day | ORAL | 0 refills | Status: DC

## 2019-05-03 MED ORDER — TRAMADOL HCL 50 MG PO TABS: 50.0000 mg | ORAL_TABLET | Freq: Two times a day (BID) | ORAL | 0 refills | Status: DC | PRN

## 2019-05-03 NOTE — Discharge Summary (Signed)
[facility] NAME: Angel Brandt    MR#:  [id number]  DATE OF BIRTH:  [birth year]  DATE OF ADMISSION:  04/29/2019 ADMITTING PHYSICIAN: Christel Mormon, MD  DATE OF DISCHARGE: 05/03/2019   PRIMARY CARE PHYSICIAN: Maryland Pink, MD    ADMISSION DIAGNOSIS:  Fall [W19.XXXA] Fall, initial encounter [id number]] Closed fracture of right hip, initial encounter ([facility]) [S72.001A]  DISCHARGE DIAGNOSIS:  Active Problems:   Closed right hip fracture, initial encounter ([place])   Protein-calorie malnutrition, severe   SECONDARY DIAGNOSIS:   Past Medical History:  Diagnosis Date  . At high risk for falls   . Blind   . Breast cancer ([place])   . Cancer ([place])   . Sherran Needs syndrome 2017  . GERD (gastroesophageal reflux disease)   . Hearing impaired   . Macular degeneration   . Malaria   . Osteoporosis     HOSPITAL COURSE:    Closed right hip fracture, initial encounter ([place])   Protein-calorie malnutrition, severe  1.Right hip fracture -Dr. Lisette Grinder did IM nail surgery 04/30/19 -Pain is being managed with IV and oral analgesic - Need Rehab placement.  2. Acute right lower extremity pain - As needed analgesic  3. GERD -IV PPI prophylaxis  4.  Acute blood loss anemia Due to surgery, continue to monitor hemoglobin.  Stable, no need to transfuse.  5.  Dementia This is patient's baseline.  DVT prophylaxis with SCDs and PPI prophylaxis initiated   DISCHARGE CONDITIONS:   Stable.  CONSULTS OBTAINED:    DRUG ALLERGIES:   Allergies  Allergen Reactions  . Codeine Anaphylaxis  . Penicillins Anaphylaxis and Nausea And Vomiting  . Ativan [Lorazepam] Other (See Comments)    Agitation  . Codeine Sulfate Nausea And Vomiting  . Other Other (See Comments)  . Sulfa Antibiotics Other (See Comments)    unknown    DISCHARGE MEDICATIONS:   Allergies as of 05/03/2019      Reactions   Codeine Anaphylaxis    Penicillins Anaphylaxis, Nausea And Vomiting   Ativan [lorazepam] Other (See Comments)   Agitation   Codeine Sulfate Nausea And Vomiting   Other Other (See Comments)   Sulfa Antibiotics Other (See Comments)   unknown      Medication List    TAKE these medications   acetaminophen 500 MG tablet Commonly known as: TYLENOL Take 1,000 mg by mouth every 6 (six) hours as needed for mild pain.   aspirin 81 MG EC tablet Take 1 tablet (81 mg total) by mouth daily.   docusate sodium 100 MG capsule Commonly known as: COLACE Take 1 capsule (100 mg total) by mouth 2 (two) times daily.   enoxaparin 40 MG/0.4ML injection Commonly known as: LOVENOX Inject 0.4 mLs (40 mg total) into the skin daily for 20 days. Start taking on: May 04, 2019   feeding supplement (ENSURE ENLIVE) Liqd Take 237 mLs by mouth 2 (two) times daily between meals.   ferrous sulfate 325 (65 FE) MG tablet Take 1 tablet (325 mg total) by mouth 3 (three) times daily after meals.   HYDROcodone-acetaminophen 5-325 MG tablet Commonly known as: NORCO/VICODIN Take 1-2 tablets by mouth every 6 (six) hours as needed for severe pain (pain score 4-6).   mirtazapine 45 MG tablet Commonly known as: REMERON Take 45 mg by mouth at bedtime.   multivitamin with minerals Tabs tablet Take 1 tablet by mouth daily. Start taking on: May 04, 2019   omeprazole 20  MG capsule Commonly known as: PRILOSEC Take 20 mg by mouth 2 (two) times daily before a meal.   polyethylene glycol 17 g packet Commonly known as: MIRALAX / GLYCOLAX Take 17 g by mouth daily. Start taking on: May 04, 2019   QUEtiapine 25 MG tablet Commonly known as: SEROQUEL Take 25 mg by mouth at bedtime.   traMADol 50 MG tablet Commonly known as: ULTRAM Take 1 tablet (50 mg total) by mouth every 12 (twelve) hours as needed for moderate pain.        DISCHARGE INSTRUCTIONS:    Follow with ortho clinic in 1-2 weeks.  If you experience worsening of your  admission symptoms, develop shortness of breath, life threatening emergency, suicidal or homicidal thoughts you must seek medical attention immediately by calling 911 or calling your MD immediately  if symptoms less severe.  You Must read complete instructions/literature along with all the possible adverse reactions/side effects for all the Medicines you take and that have been prescribed to you. Take any new Medicines after you have completely understood and accept all the possible adverse reactions/side effects.   Please note  You were cared for by a hospitalist during your hospital stay. If you have any questions about your discharge medications or the care you received while you were in the hospital after you are discharged, you can call the unit and asked to speak with the hospitalist on call if the hospitalist that took care of you is not available. Once you are discharged, your primary care physician will handle any further medical issues. Please note that NO REFILLS for any discharge medications will be authorized once you are discharged, as it is imperative that you return to your primary care physician (or establish a relationship with a primary care physician if you do not have one) for your aftercare needs so that they can reassess your need for medications and monitor your lab values.    Today   CHIEF COMPLAINT:   Chief Complaint  Patient presents with  . Fall    HISTORY OF PRESENT ILLNESS:  Angel Brandt  is a 85 y.o. female with a known history of osteoporosis, macular degeneration, GERD, breast cancer, history of TIA in 2018.  Patient presented to the emergency room via EMS services after sustaining an unwitnessed fall on her front porch which is a concrete surface occurred around 9 PM.  She presented with complaint of right hip pain.  She denies hitting her head or loss of consciousness.  She denies chest pain, shortness of breath, abdominal pain.  CT head demonstrated no acute  intracranial abnormalities.  Chest x-ray demonstrated interstitial lung disease which have progressed from the prior exam.  Right hip x-ray demonstrated displaced mildly comminuted intertrochanteric right femur fracture.  Dr. Lisette Grinder consulted by the ED physician.  Patient is being held n.p.o. for surgery in the a.m. She has been admitted by the hospitalist service for further management    VITAL SIGNS:  Blood pressure (!) 146/41, pulse 77, temperature 97.7 F (36.5 C), resp. rate 17, height 5\' 1"  (1.549 m), weight 54.3 kg, SpO2 99 %.  I/O:    Intake/Output Summary (Last 24 hours) at 05/03/2019 1054 Last data filed at 05/02/2019 1259 Gross per 24 hour  Intake -  Output 400 ml  Net -400 ml    PHYSICAL EXAMINATION:   GENERAL:  85 y.o.-year-old patient lying in the bed with no acute distress.  EYES: Pupils equal, round, reactive to light and accommodation. No scleral  icterus. Extraocular muscles intact.  HEENT: Head atraumatic, normocephalic. Oropharynx and nasopharynx clear.  NECK:  Supple, no jugular venous distention. No thyroid enlargement, no tenderness.  LUNGS: Normal breath sounds bilaterally, no wheezing, rales,rhonchi or crepitation. No use of accessory muscles of respiration.  CARDIOVASCULAR: S1, S2 normal. No murmurs, rubs, or gallops.  ABDOMEN: Soft, nontender, nondistended. Bowel sounds present. No organomegaly or mass.  EXTREMITIES: No pedal edema, cyanosis, or clubbing.  NEUROLOGIC: Cranial nerves II through XII are intact. Muscle strength 3/5 in all extremities. Sensation intact. Gait not checked.  PSYCHIATRIC: The patient is alert and oriented x 1.  SKIN: No obvious rash, lesion, or ulcer.   DATA REVIEW:   CBC Recent Labs  Lab 05/02/19 0848  WBC 6.5  HGB 8.4*  HCT 24.8*  PLT 146*    Chemistries  Recent Labs  Lab 04/29/19 2204  05/01/19 0501  NA 128*   < > 130*  K 4.2   < > 4.5  CL 94*   < > 99  CO2 24   < > 24  GLUCOSE 99   < > 129*  BUN 14    < > 16  CREATININE 0.75   < > 0.91  CALCIUM 8.8*   < > 8.0*  AST 20  --   --   ALT 15  --   --   ALKPHOS 110  --   --   BILITOT 0.5  --   --    < > = values in this interval not displayed.    Cardiac Enzymes No results for input(s): TROPONINI in the last 168 hours.  Microbiology Results  Results for orders placed or performed during the hospital encounter of 04/29/19  SARS Coronavirus 2 (CEPHEID - Performed in [facility] hospital lab), Hosp Order     Status: None   Collection Time: 04/29/19 10:48 PM   Specimen: Nasopharyngeal Swab  Result Value Ref Range Status   SARS Coronavirus 2 NEGATIVE NEGATIVE Final    Comment: (NOTE) If result is NEGATIVE SARS-CoV-2 target nucleic acids are NOT DETECTED. The SARS-CoV-2 RNA is generally detectable in upper and lower  respiratory specimens during the acute phase of infection. The lowest  concentration of SARS-CoV-2 viral copies this assay can detect is 250  copies / mL. A negative result does not preclude SARS-CoV-2 infection  and should not be used as the sole basis for treatment or other  patient management decisions.  A negative result Karas occur with  improper specimen collection / handling, submission of specimen other  than nasopharyngeal swab, presence of viral mutation(s) within the  areas targeted by this assay, and inadequate number of viral copies  (<250 copies / mL). A negative result must be combined with clinical  observations, patient history, and epidemiological information. If result is POSITIVE SARS-CoV-2 target nucleic acids are DETECTED. The SARS-CoV-2 RNA is generally detectable in upper and lower  respiratory specimens dur ing the acute phase of infection.  Positive  results are indicative of active infection with SARS-CoV-2.  Clinical  correlation with patient history and other diagnostic information is  necessary to determine patient infection status.  Positive results do  not rule out bacterial infection or  co-infection with other viruses. If result is PRESUMPTIVE POSTIVE SARS-CoV-2 nucleic acids [place] BE PRESENT.   A presumptive positive result was obtained on the submitted specimen  and confirmed on repeat testing.  While 2019 novel coronavirus  (SARS-CoV-2) nucleic acids [place] be present in the submitted sample  additional confirmatory testing [place] be necessary for epidemiological  and / or clinical management purposes  to differentiate between  SARS-CoV-2 and other Sarbecovirus currently known to infect humans.  If clinically indicated additional testing with an alternate test  methodology [phone]) is advised. The SARS-CoV-2 RNA is generally  detectable in upper and lower respiratory sp ecimens during the acute  phase of infection. The expected result is Negative. Fact Sheet for Patients:  StrictlyIdeas.no Fact Sheet for Healthcare Providers: BankingDealers.co.za This test is not yet approved or cleared by the [place] FDA and has been authorized for detection and/or diagnosis of SARS-CoV-2 by FDA under an Emergency Use Authorization (EUA).  This EUA will remain in effect (meaning this test can be used) for the duration of the COVID-19 declaration under Section 564(b)(1) of the Act, 21 U.S.C. section 360bbb-3(b)(1), unless the authorization is terminated or revoked sooner. Performed at [facility], [address], [place], [place] [address]   Surgical PCR screen     Status: None   Collection Time: 04/30/19  6:21 AM   Specimen: Nasal Mucosa; Nasal Swab  Result Value Ref Range Status   MRSA, PCR NEGATIVE NEGATIVE Final   Staphylococcus aureus NEGATIVE NEGATIVE Final    Comment: (NOTE) The Xpert SA Assay (FDA approved for NASAL specimens in patients 52 years of age and older), is one component of a comprehensive surveillance program. It is not intended to diagnose infection nor to guide or monitor treatment. Performed at  [facility], [address], [place], [place] [address]     RADIOLOGY:  No results found.  EKG:   Orders placed or performed during the hospital encounter of 04/29/19  . ED EKG  . ED EKG  . EKG 12-Lead  . EKG 12-Lead      Management plans discussed with the patient, family and they are in agreement.  CODE STATUS:     Code Status Orders  (From admission, onward)         Start     Ordered   04/29/19 2346  Full code  Continuous     04/29/19 2345        Code Status History    Date Active Date Inactive Code Status Order ID Comments User Context   10/18/2017 0342 10/19/2017 1620 Full Code [id number]  Saundra Shelling, MD Inpatient   Advance Care Planning Activity    Advance Directive Documentation     Most Recent Value  Type of Advance Directive  Healthcare Power of Attorney  Pre-existing out of facility DNR order (yellow form or pink MOST form)  -  "MOST" Form in Place?  -      TOTAL TIME TAKING CARE OF THIS PATIENT: 35 minutes.    Vaughan Basta M.D on 05/03/2019 at 10:54 AM  Between 7am to 6pm - Pager - [phone]  After 6pm go to [email or web address] - password [email or web address] [facility]  Office  [phone]  CC: Primary care physician; Maryland Pink, MD   Note: This dictation was prepared with Dragon dictation along with smaller phrase technology. Any transcriptional errors that result from this process are unintentional.

## 2019-05-03 NOTE — Progress Notes (Signed)
  Subjective:  POD #3 s/p IM fixation for right intertrochanteric hip fracture.   Patient is confused and is unable to provide an accurate history.  She does not appear to be in any acute distress.  Patient denies any significant right hip pain.   Objective:   VITALS:   Vitals:   05/02/19 0728 05/02/19 1619 05/02/19 2315 05/03/19 0734  BP: 98/74 (!) 173/65 (!) 138/58 (!) 146/41  Pulse: 70 85 89 77  Resp:   18 17  Temp: 98.8 F (37.1 C) 99.2 F (37.3 C) 97.9 F (36.6 C) 97.7 F (36.5 C)  TempSrc: Oral Oral    SpO2: 99% 95% 97% 99%  Weight:      Height:        PHYSICAL EXAM: Right lower extremity Neurovascular intact Sensation intact distally Intact pulses distally Dorsiflexion/Plantar flexion intact Incision: scant drainage No cellulitis present Compartment soft  LABS  Results for orders placed or performed during the hospital encounter of 04/29/19 (from the past 24 hour(s))  SARS Coronavirus 2 [facility] order, Performed in [place] hospital lab)     Status: None   Collection Time: 05/03/19 12:10 PM   Specimen: Nasopharyngeal Swab  Result Value Ref Range   SARS Coronavirus 2 NEGATIVE NEGATIVE    No results found.  Assessment/Plan: 3 Days Post-Op   Active Problems:   Closed right hip fracture, initial encounter ([place])   Protein-calorie malnutrition, severe  Patient is doing well from an orthopedic standpoint following surgery.  She is weightbearing as tolerated on the right lower extremity.  Patient should continue Lovenox 40 mg daily for DVT prophylaxis x4 weeks postop.  She will follow-up in the orthopedic office in approximately 10 days following discharge from the hospital.    Thornton Park , MD 05/03/2019, 1:52 PM

## 2019-05-03 NOTE — TOC Transition Note (Signed)
Transition of Care [facility]) - CM/SW Discharge Note   Patient Details  Name: Angel Brandt MRN: [id number] Date of Birth: [birth year]  Transition of [facility]) CM/SW Contact:  Su Hilt, RN Phone Number: 05/03/2019, 11:47 AM   Clinical Narrative:    Patient to discharge to [facility] today to room 807, the Daughter [name] was called and notified of the discharge and states understanding.  The bedside nurse is to call report to [phone] and call EMS for transport once ready   Final next level of care: Skilled Nursing Facility Barriers to Discharge: Barriers Resolved   Patient Goals and CMS Choice   CMS Medicare.gov Compare Post Acute Care list provided to:: Patient Represenative (must comment)(Daughter [name]) Choice offered to / list presented to : Adult Children  Discharge Placement                       Discharge Plan and Services   Discharge Planning Services: CM Consult            DME Arranged: N/A         HH Arranged: NA          Social Determinants of Health (SDOH) Interventions     Readmission Risk Interventions No flowsheet data found.

## 2019-05-03 NOTE — Progress Notes (Signed)
Physical Therapy Treatment Patient Details Name: Angel Brandt MRN: [id number] DOB: [id number]-1925 Today's Date: 05/03/2019    History of Present Illness 85 y/o female s/p fall with R Hip fx and subsequent IM fixation.      PT Comments    Pt continues to be limited with ability to fully engage with PT secondary to confusion, pain and today some lethargy.  She was able to occasionally give some assist with AAROM tasks but with nearly all tasks involving R hip and knee she was calling out in pain and highly anxious despite constant cuing, encouragement and reinforcement.  Pt struggling with most aspects of PT, unable to attempt mobility tasks secondary to pain and confusion.  Follow Up Recommendations  SNF     Equipment Recommendations  Rolling walker with 5" wheels    Recommendations for Other Services       Precautions / Restrictions Precautions Precautions: Fall Restrictions RLE Weight Bearing: Weight bearing as tolerated    Mobility  Bed Mobility               General bed mobility comments: deferred again as pt is still very pain reactive to even minimal R LE movement and became more desperate and anxious with attempts to do more than very basic tasks.  Transfers                    Ambulation/Gait                 Stairs             Wheelchair Mobility    Modified Rankin (Stroke Patients Only)       Balance                                            Cognition Arousal/Alertness: Lethargic Behavior During Therapy: Restless;Anxious Overall Cognitive Status: History of cognitive impairments - at baseline                                        Exercises Total Joint Exercises Ankle Circles/Pumps: AAROM;20 reps Quad Sets: AROM;10 reps Short Arc Quad: AAROM;15 reps Heel Slides: AAROM;5 reps(calling out in pain t/o the effort) Hip ABduction/ADduction: PROM;AAROM;10 reps Straight Leg Raises: PROM;10  reps    General Comments        Pertinent Vitals/Pain Pain Assessment: Faces Faces Pain Scale: Hurts whole lot Pain Location: indicates very little pain at rest, calling out in severe pain with any R LE movement    Home Living                      Prior Function            PT Goals (current goals can now be found in the care plan section) Progress towards PT goals: Progressing toward goals    Frequency    7X/week      PT Plan Frequency needs to be updated    Co-evaluation              AM-PAC PT "6 Clicks" Mobility   Outcome Measure  Help needed turning from your back to your side while in a flat bed without using bedrails?: Total Help needed moving from lying on your back to sitting  on the side of a flat bed without using bedrails?: Total Help needed moving to and from a bed to a chair (including a wheelchair)?: Total Help needed standing up from a chair using your arms (e.g., wheelchair or bedside chair)?: Total Help needed to walk in hospital room?: Total Help needed climbing 3-5 steps with a railing? : Total 6 Click Score: 6    End of Session Equipment Utilized During Treatment: Gait belt Activity Tolerance: Patient limited by pain;Patient limited by lethargy Patient left: with call bell/phone within reach;with bed alarm set Nurse Communication: Mobility status PT Visit Diagnosis: Muscle weakness (generalized) (M62.81);Difficulty in walking, not elsewhere classified (R26.2)     Time: 4098-1191 PT Time Calculation (min) (ACUTE ONLY): 15 min  Charges:  $Therapeutic Exercise: 8-22 mins                     Kreg Shropshire, DPT 05/03/2019, 10:46 AM

## 2019-05-03 NOTE — Progress Notes (Signed)
Patient discharging today via EMS to [facility]. Report called to facility. All personal belongings with patient. Patient demonstrates no s/sx of distress.  AVS included in d/c packet.Marland Kitchen

## 2020-01-08 ENCOUNTER — Other Ambulatory Visit: Payer: Self-pay

## 2020-01-08 ENCOUNTER — Encounter: Payer: Self-pay | Admitting: Anesthesiology

## 2020-01-08 ENCOUNTER — Inpatient Hospital Stay
Admission: EM | Admit: 2020-01-08 | Discharge: 2020-01-14 | DRG: 853 | Disposition: A | Discharge status: Hospice | Payer: Medicare Other | Attending: Internal Medicine | Admitting: Internal Medicine

## 2020-01-08 ENCOUNTER — Emergency Department: Payer: Medicare Other

## 2020-01-08 ENCOUNTER — Other Ambulatory Visit (INDEPENDENT_AMBULATORY_CARE_PROVIDER_SITE_OTHER): Payer: Self-pay | Admitting: Vascular Surgery

## 2020-01-08 ENCOUNTER — Encounter
Admission: EM | Disposition: A | Discharge status: Hospice | Payer: Self-pay | Source: Home / Self Care | Attending: Internal Medicine

## 2020-01-08 ENCOUNTER — Encounter: Payer: Self-pay | Admitting: Emergency Medicine

## 2020-01-08 DIAGNOSIS — T25321A Burn of third degree of right foot, initial encounter: Secondary | ICD-10-CM | POA: Diagnosis present

## 2020-01-08 DIAGNOSIS — Z7901 Long term (current) use of anticoagulants: Secondary | ICD-10-CM

## 2020-01-08 DIAGNOSIS — A419 Sepsis, unspecified organism: Secondary | ICD-10-CM

## 2020-01-08 DIAGNOSIS — L8990 Pressure ulcer of unspecified site, unspecified stage: Secondary | ICD-10-CM | POA: Insufficient documentation

## 2020-01-08 DIAGNOSIS — E43 Unspecified severe protein-calorie malnutrition: Secondary | ICD-10-CM | POA: Diagnosis present

## 2020-01-08 DIAGNOSIS — L02611 Cutaneous abscess of right foot: Secondary | ICD-10-CM | POA: Diagnosis present

## 2020-01-08 DIAGNOSIS — N179 Acute kidney failure, unspecified: Secondary | ICD-10-CM

## 2020-01-08 DIAGNOSIS — H548 Legal blindness, as defined in USA: Secondary | ICD-10-CM | POA: Diagnosis present

## 2020-01-08 DIAGNOSIS — Z8673 Personal history of transient ischemic attack (TIA), and cerebral infarction without residual deficits: Secondary | ICD-10-CM

## 2020-01-08 DIAGNOSIS — Z7189 Other specified counseling: Secondary | ICD-10-CM

## 2020-01-08 DIAGNOSIS — F0390 Unspecified dementia without behavioral disturbance: Secondary | ICD-10-CM | POA: Diagnosis present

## 2020-01-08 DIAGNOSIS — Z886 Allergy status to analgesic agent status: Secondary | ICD-10-CM

## 2020-01-08 DIAGNOSIS — X100XXA Contact with hot drinks, initial encounter: Secondary | ICD-10-CM | POA: Diagnosis present

## 2020-01-08 DIAGNOSIS — R64 Cachexia: Secondary | ICD-10-CM | POA: Diagnosis present

## 2020-01-08 DIAGNOSIS — M810 Age-related osteoporosis without current pathological fracture: Secondary | ICD-10-CM | POA: Diagnosis present

## 2020-01-08 DIAGNOSIS — Z515 Encounter for palliative care: Secondary | ICD-10-CM | POA: Diagnosis not present

## 2020-01-08 DIAGNOSIS — E860 Dehydration: Secondary | ICD-10-CM | POA: Diagnosis not present

## 2020-01-08 DIAGNOSIS — Z20822 Contact with and (suspected) exposure to covid-19: Secondary | ICD-10-CM | POA: Diagnosis present

## 2020-01-08 DIAGNOSIS — T798XXA Other early complications of trauma, initial encounter: Secondary | ICD-10-CM | POA: Diagnosis present

## 2020-01-08 DIAGNOSIS — Z681 Body mass index (BMI) 19 or less, adult: Secondary | ICD-10-CM

## 2020-01-08 DIAGNOSIS — A4181 Sepsis due to Enterococcus: Principal | ICD-10-CM | POA: Diagnosis present

## 2020-01-08 DIAGNOSIS — L089 Local infection of the skin and subcutaneous tissue, unspecified: Secondary | ICD-10-CM | POA: Diagnosis not present

## 2020-01-08 DIAGNOSIS — Z853 Personal history of malignant neoplasm of breast: Secondary | ICD-10-CM | POA: Diagnosis not present

## 2020-01-08 DIAGNOSIS — F17290 Nicotine dependence, other tobacco product, uncomplicated: Secondary | ICD-10-CM | POA: Diagnosis present

## 2020-01-08 DIAGNOSIS — H9190 Unspecified hearing loss, unspecified ear: Secondary | ICD-10-CM | POA: Diagnosis present

## 2020-01-08 DIAGNOSIS — Z7982 Long term (current) use of aspirin: Secondary | ICD-10-CM

## 2020-01-08 DIAGNOSIS — Z79899 Other long term (current) drug therapy: Secondary | ICD-10-CM

## 2020-01-08 DIAGNOSIS — A4152 Sepsis due to Pseudomonas: Secondary | ICD-10-CM | POA: Diagnosis present

## 2020-01-08 DIAGNOSIS — L98498 Non-pressure chronic ulcer of skin of other sites with other specified severity: Secondary | ICD-10-CM | POA: Diagnosis present

## 2020-01-08 DIAGNOSIS — K219 Gastro-esophageal reflux disease without esophagitis: Secondary | ICD-10-CM | POA: Diagnosis present

## 2020-01-08 DIAGNOSIS — L89223 Pressure ulcer of left hip, stage 3: Secondary | ICD-10-CM | POA: Diagnosis present

## 2020-01-08 DIAGNOSIS — Z66 Do not resuscitate: Secondary | ICD-10-CM | POA: Diagnosis present

## 2020-01-08 DIAGNOSIS — E871 Hypo-osmolality and hyponatremia: Secondary | ICD-10-CM

## 2020-01-08 DIAGNOSIS — T148XXA Other injury of unspecified body region, initial encounter: Secondary | ICD-10-CM | POA: Diagnosis not present

## 2020-01-08 DIAGNOSIS — T25221S Burn of second degree of right foot, sequela: Secondary | ICD-10-CM | POA: Diagnosis not present

## 2020-01-08 DIAGNOSIS — Z882 Allergy status to sulfonamides status: Secondary | ICD-10-CM

## 2020-01-08 DIAGNOSIS — Z888 Allergy status to other drugs, medicaments and biological substances status: Secondary | ICD-10-CM

## 2020-01-08 DIAGNOSIS — R6520 Severe sepsis without septic shock: Secondary | ICD-10-CM | POA: Diagnosis present

## 2020-01-08 DIAGNOSIS — L89892 Pressure ulcer of other site, stage 2: Secondary | ICD-10-CM | POA: Diagnosis present

## 2020-01-08 DIAGNOSIS — D638 Anemia in other chronic diseases classified elsewhere: Secondary | ICD-10-CM | POA: Diagnosis not present

## 2020-01-08 DIAGNOSIS — I70261 Atherosclerosis of native arteries of extremities with gangrene, right leg: Secondary | ICD-10-CM | POA: Diagnosis present

## 2020-01-08 DIAGNOSIS — Y92019 Unspecified place in single-family (private) house as the place of occurrence of the external cause: Secondary | ICD-10-CM | POA: Diagnosis not present

## 2020-01-08 DIAGNOSIS — Z887 Allergy status to serum and vaccine status: Secondary | ICD-10-CM

## 2020-01-08 DIAGNOSIS — L03115 Cellulitis of right lower limb: Secondary | ICD-10-CM | POA: Diagnosis present

## 2020-01-08 DIAGNOSIS — H3530 Unspecified macular degeneration: Secondary | ICD-10-CM | POA: Diagnosis present

## 2020-01-08 DIAGNOSIS — L89152 Pressure ulcer of sacral region, stage 2: Secondary | ICD-10-CM | POA: Diagnosis present

## 2020-01-08 DIAGNOSIS — Z885 Allergy status to narcotic agent status: Secondary | ICD-10-CM

## 2020-01-08 DIAGNOSIS — T310 Burns involving less than 10% of body surface: Secondary | ICD-10-CM | POA: Diagnosis present

## 2020-01-08 DIAGNOSIS — Z170 Estrogen receptor positive status [ER+]: Secondary | ICD-10-CM

## 2020-01-08 HISTORY — PX: LOWER EXTREMITY ANGIOGRAPHY: CATH118251

## 2020-01-08 LAB — CBC WITH DIFFERENTIAL/PLATELET
Abs Immature Granulocytes: 0.07 10*3/uL (ref 0.00–0.07)
Basophils Absolute: 0.1 10*3/uL (ref 0.0–0.1)
Basophils Relative: 1 %
Eosinophils Absolute: 0.1 10*3/uL (ref 0.0–0.5)
Eosinophils Relative: 1 %
HCT: 30.4 % — ABNORMAL LOW (ref 36.0–46.0)
Hemoglobin: 10.1 g/dL — ABNORMAL LOW (ref 12.0–15.0)
Immature Granulocytes: 1 %
Lymphocytes Relative: 17 %
Lymphs Abs: 1.5 10*3/uL (ref 0.7–4.0)
MCH: 29.5 pg (ref 26.0–34.0)
MCHC: 33.2 g/dL (ref 30.0–36.0)
MCV: 88.9 fL (ref 80.0–100.0)
Monocytes Absolute: 0.5 10*3/uL (ref 0.1–1.0)
Monocytes Relative: 6 %
Neutro Abs: 6.5 10*3/uL (ref 1.7–7.7)
Neutrophils Relative %: 74 %
Platelets: 368 10*3/uL (ref 150–400)
RBC: 3.42 MIL/uL — ABNORMAL LOW (ref 3.87–5.11)
RDW: 12.5 % (ref 11.5–15.5)
WBC: 8.7 10*3/uL (ref 4.0–10.5)
nRBC: 0 % (ref 0.0–0.2)

## 2020-01-08 LAB — COMPREHENSIVE METABOLIC PANEL
ALT: 14 U/L (ref 0–44)
AST: 24 U/L (ref 15–41)
Albumin: 3.2 g/dL — ABNORMAL LOW (ref 3.5–5.0)
Alkaline Phosphatase: 122 U/L (ref 38–126)
Anion gap: 13 (ref 5–15)
BUN: 34 mg/dL — ABNORMAL HIGH (ref 8–23)
CO2: 21 mmol/L — ABNORMAL LOW (ref 22–32)
Calcium: 8.9 mg/dL (ref 8.9–10.3)
Chloride: 94 mmol/L — ABNORMAL LOW (ref 98–111)
Creatinine, Ser: 1.21 mg/dL — ABNORMAL HIGH (ref 0.44–1.00)
GFR calc Af Amer: 44 mL/min — ABNORMAL LOW (ref >60)
GFR calc non Af Amer: 38 mL/min — ABNORMAL LOW (ref >60)
Glucose, Bld: 154 mg/dL — ABNORMAL HIGH (ref 70–99)
Potassium: 3.8 mmol/L (ref 3.5–5.1)
Sodium: 128 mmol/L — ABNORMAL LOW (ref 135–145)
Total Bilirubin: 0.7 mg/dL (ref 0.3–1.2)
Total Protein: 6.9 g/dL (ref 6.5–8.1)

## 2020-01-08 LAB — APTT: aPTT: 37 seconds — ABNORMAL HIGH (ref 24–36)

## 2020-01-08 LAB — LACTIC ACID, PLASMA
Lactic Acid, Venous: 4.2 mmol/L (ref 0.5–1.9)
Lactic Acid, Venous: 2.8 mmol/L (ref 0.5–1.9)

## 2020-01-08 LAB — RESPIRATORY PANEL BY RT PCR (FLU A&B, COVID)
Influenza A by PCR: NEGATIVE
Influenza B by PCR: NEGATIVE
SARS Coronavirus 2 by RT PCR: NEGATIVE

## 2020-01-08 LAB — PROTIME-INR
INR: 1.2 (ref 0.8–1.2)
Prothrombin Time: 14.9 seconds (ref 11.4–15.2)

## 2020-01-08 SURGERY — LOWER EXTREMITY ANGIOGRAPHY
Anesthesia: Moderate Sedation | Laterality: Right

## 2020-01-08 MED ORDER — HYDROMORPHONE HCL 1 MG/ML IJ SOLN: 1.0000 mg | Freq: Once | INTRAMUSCULAR | Status: DC | PRN

## 2020-01-08 MED ORDER — VANCOMYCIN HCL IN DEXTROSE 1-5 GM/200ML-% IV SOLN
1000.0000 mg | Freq: Once | INTRAVENOUS | Status: AC
  Administered 2020-01-08: 06:00:00 1000 mg via INTRAVENOUS
  Filled 2020-01-08: qty 200

## 2020-01-08 MED ORDER — ADULT MULTIVITAMIN W/MINERALS CH
1.0000 | ORAL_TABLET | Freq: Every day | ORAL | Status: DC
  Administered 2020-01-11 – 2020-01-13 (×3): 1 via ORAL
  Filled 2020-01-08 (×4): qty 1

## 2020-01-08 MED ORDER — ONDANSETRON HCL 4 MG/2ML IJ SOLN: 4.0000 mg | Freq: Four times a day (QID) | INTRAMUSCULAR | Status: DC | PRN

## 2020-01-08 MED ORDER — ENOXAPARIN SODIUM 40 MG/0.4ML ~~LOC~~ SOLN
30.0000 mg | SUBCUTANEOUS | Status: DC
  Administered 2020-01-08: 30 mg via SUBCUTANEOUS
  Filled 2020-01-08: qty 0.4

## 2020-01-08 MED ORDER — LACTATED RINGERS IV BOLUS
1000.0000 mL | Freq: Once | INTRAVENOUS | Status: AC
  Administered 2020-01-08: 1000 mL via INTRAVENOUS

## 2020-01-08 MED ORDER — MIDAZOLAM HCL 5 MG/5ML IJ SOLN
INTRAMUSCULAR | Status: AC
  Filled 2020-01-08: qty 5

## 2020-01-08 MED ORDER — ASPIRIN 81 MG PO TBEC: 81.0000 mg | DELAYED_RELEASE_TABLET | Freq: Every day | ORAL | Status: DC

## 2020-01-08 MED ORDER — DOCUSATE SODIUM 100 MG PO CAPS
100.0000 mg | ORAL_CAPSULE | Freq: Two times a day (BID) | ORAL | Status: DC
  Administered 2020-01-11 – 2020-01-13 (×5): 100 mg via ORAL
  Filled 2020-01-08 (×7): qty 1

## 2020-01-08 MED ORDER — POLYETHYLENE GLYCOL 3350 17 G PO PACK
17.0000 g | PACK | Freq: Every day | ORAL | Status: DC
  Administered 2020-01-11 – 2020-01-13 (×3): 17 g via ORAL
  Filled 2020-01-08 (×4): qty 1

## 2020-01-08 MED ORDER — ACETAMINOPHEN 325 MG PO TABS
650.0000 mg | ORAL_TABLET | ORAL | Status: DC | PRN
  Administered 2020-01-14: 650 mg via ORAL
  Filled 2020-01-08 (×2): qty 2

## 2020-01-08 MED ORDER — FENTANYL CITRATE (PF) 100 MCG/2ML IJ SOLN
INTRAMUSCULAR | Status: DC | PRN
  Administered 2020-01-08: 25 ug via INTRAVENOUS
  Administered 2020-01-08: 50 ug via INTRAVENOUS

## 2020-01-08 MED ORDER — SODIUM CHLORIDE 0.9% FLUSH: 3.0000 mL | INTRAVENOUS | Status: DC | PRN

## 2020-01-08 MED ORDER — HEPARIN SODIUM (PORCINE) 1000 UNIT/ML IJ SOLN
INTRAMUSCULAR | Status: AC
  Filled 2020-01-08: qty 1

## 2020-01-08 MED ORDER — METHYLPREDNISOLONE SODIUM SUCC 125 MG IJ SOLR: 125.0000 mg | Freq: Once | INTRAMUSCULAR | Status: DC | PRN

## 2020-01-08 MED ORDER — SODIUM CHLORIDE 0.9 % IV SOLN
1.0000 g | Freq: Once | INTRAVENOUS | Status: AC
  Administered 2020-01-08: 1 g via INTRAVENOUS
  Filled 2020-01-08: qty 1

## 2020-01-08 MED ORDER — ACETAMINOPHEN 500 MG PO TABS
1000.0000 mg | ORAL_TABLET | Freq: Four times a day (QID) | ORAL | Status: DC | PRN
  Administered 2020-01-10: 1000 mg via ORAL
  Filled 2020-01-08: qty 2

## 2020-01-08 MED ORDER — SODIUM CHLORIDE 0.9 % IV SOLN
2.0000 g | INTRAVENOUS | Status: DC
  Administered 2020-01-09: 2 g via INTRAVENOUS
  Filled 2020-01-08: qty 2

## 2020-01-08 MED ORDER — HYDROCODONE-ACETAMINOPHEN 5-325 MG PO TABS: 1.0000 | ORAL_TABLET | Freq: Four times a day (QID) | ORAL | Status: DC | PRN

## 2020-01-08 MED ORDER — SODIUM CHLORIDE 0.9 % IV SOLN: INTRAVENOUS | Status: DC

## 2020-01-08 MED ORDER — VANCOMYCIN HCL IN DEXTROSE 1-5 GM/200ML-% IV SOLN: 1000.0000 mg | INTRAVENOUS | Status: DC

## 2020-01-08 MED ORDER — FAMOTIDINE 20 MG PO TABS: 40.0000 mg | ORAL_TABLET | Freq: Once | ORAL | Status: DC | PRN

## 2020-01-08 MED ORDER — ENSURE ENLIVE PO LIQD
237.0000 mL | Freq: Two times a day (BID) | ORAL | Status: DC
  Administered 2020-01-10 – 2020-01-14 (×9): 237 mL via ORAL

## 2020-01-08 MED ORDER — DIPHENHYDRAMINE HCL 50 MG/ML IJ SOLN: 50.0000 mg | Freq: Once | INTRAMUSCULAR | Status: DC | PRN

## 2020-01-08 MED ORDER — PANTOPRAZOLE SODIUM 40 MG PO TBEC
40.0000 mg | DELAYED_RELEASE_TABLET | Freq: Every day | ORAL | Status: DC
  Administered 2020-01-11 – 2020-01-13 (×3): 40 mg via ORAL
  Filled 2020-01-08 (×4): qty 1

## 2020-01-08 MED ORDER — MIRTAZAPINE 15 MG PO TABS
45.0000 mg | ORAL_TABLET | Freq: Every day | ORAL | Status: DC
  Administered 2020-01-10 – 2020-01-12 (×3): 45 mg via ORAL
  Filled 2020-01-08 (×3): qty 3

## 2020-01-08 MED ORDER — MORPHINE SULFATE (PF) 4 MG/ML IV SOLN
2.0000 mg | INTRAVENOUS | Status: DC | PRN
  Filled 2020-01-08: qty 1

## 2020-01-08 MED ORDER — QUETIAPINE FUMARATE 25 MG PO TABS
25.0000 mg | ORAL_TABLET | Freq: Every day | ORAL | Status: DC
  Administered 2020-01-10 – 2020-01-13 (×4): 25 mg via ORAL
  Filled 2020-01-08 (×4): qty 1

## 2020-01-08 MED ORDER — CLINDAMYCIN PHOSPHATE 300 MG/50ML IV SOLN
300.0000 mg | Freq: Once | INTRAVENOUS | Status: AC
  Filled 2020-01-08: qty 50

## 2020-01-08 MED ORDER — SODIUM CHLORIDE 0.9 % IV SOLN: 250.0000 mL | INTRAVENOUS | Status: DC | PRN

## 2020-01-08 MED ORDER — SODIUM CHLORIDE 0.9 % IV SOLN: INTRAVENOUS | Status: AC

## 2020-01-08 MED ORDER — CLINDAMYCIN PHOSPHATE 300 MG/50ML IV SOLN
INTRAVENOUS | Status: AC
  Administered 2020-01-08: 300 mg via INTRAVENOUS
  Filled 2020-01-08: qty 50

## 2020-01-08 MED ORDER — FERROUS SULFATE 325 (65 FE) MG PO TABS
325.0000 mg | ORAL_TABLET | Freq: Three times a day (TID) | ORAL | Status: DC
  Administered 2020-01-11 – 2020-01-13 (×8): 325 mg via ORAL
  Filled 2020-01-08 (×10): qty 1

## 2020-01-08 MED ORDER — MIDAZOLAM HCL 2 MG/2ML IJ SOLN
INTRAMUSCULAR | Status: DC | PRN
  Administered 2020-01-08 (×2): 1 mg via INTRAVENOUS

## 2020-01-08 MED ORDER — ONDANSETRON HCL 4 MG PO TABS: 4.0000 mg | ORAL_TABLET | Freq: Four times a day (QID) | ORAL | Status: DC | PRN

## 2020-01-08 MED ORDER — IODIXANOL 320 MG/ML IV SOLN
INTRAVENOUS | Status: DC | PRN
  Administered 2020-01-08: 40 mL

## 2020-01-08 MED ORDER — FENTANYL CITRATE (PF) 100 MCG/2ML IJ SOLN
INTRAMUSCULAR | Status: AC
  Filled 2020-01-08: qty 2

## 2020-01-08 MED ORDER — MIDAZOLAM HCL 2 MG/ML PO SYRP: 8.0000 mg | ORAL_SOLUTION | Freq: Once | ORAL | Status: DC | PRN

## 2020-01-08 MED ORDER — SODIUM CHLORIDE 0.9% FLUSH
3.0000 mL | Freq: Two times a day (BID) | INTRAVENOUS | Status: DC
  Administered 2020-01-09 – 2020-01-12 (×6): 3 mL via INTRAVENOUS

## 2020-01-08 MED ORDER — VANCOMYCIN VARIABLE DOSE PER UNSTABLE RENAL FUNCTION (PHARMACIST DOSING): Status: DC

## 2020-01-08 SURGICAL SUPPLY — 11 items
CATH PIG 70CM (CATHETERS) ×2 IMPLANT
COVER PROBE U/S 5X48 (MISCELLANEOUS) ×2 IMPLANT
DEVICE STARCLOSE SE CLOSURE (Vascular Products) ×2 IMPLANT
GLIDEWIRE ADV .035X260CM (WIRE) ×2 IMPLANT
NDL ENTRY 21GA 7CM ECHOTIP (NEEDLE) IMPLANT
NEEDLE ENTRY 21GA 7CM ECHOTIP (NEEDLE) ×3 IMPLANT
PACK ANGIOGRAPHY (CUSTOM PROCEDURE TRAY) ×3 IMPLANT
SET INTRO CAPELLA COAXIAL (SET/KITS/TRAYS/PACK) ×2 IMPLANT
SHEATH BRITE TIP 5FRX11 (SHEATH) ×2 IMPLANT
TUBING CONTRAST HIGH PRESS 72 (TUBING) ×2 IMPLANT
WIRE J 3MM .035X145CM (WIRE) ×4 IMPLANT

## 2020-01-08 NOTE — Op Note (Signed)
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  Abdominal aortogram  2.  Right lower extremity distal runoff third order catheter placement  3.  Perclose left common femoral artery    Anesthesia: Conscious sedation was administered by the interventional radiology RN under my direct supervision. IV Versed plus fentanyl were utilized. Continuous ECG, pulse oximetry and blood pressure was monitored throughout the entire procedure.  Conscious sedation was administered for a total of 40 minutes.  Sheath: 5 French 11 cm Pinnacle left common femoral retrograde  Contrast: 40 cc   Fluoroscopy Time: 2.8 minutes  Indications:  The patient presents to [facility] with gangrenous changes to the right foot.  Pedal pulses are nonpalpable bilaterally suggesting atherosclerotic occlusive disease.  The risks and benefits as well as alternative therapies for lower extremity revascularization are reviewed with the patient all questions are answered the patient agrees to proceed.  The patient is therefore undergoing angiography with the hope for intervention for limb salvage.   Procedure:  Angel Brandt a 86 y.o. female who was identified and appropriate procedural time out was performed.  The patient was then placed supine on the table and prepped and draped in the usual sterile fashion.  Ultrasound was used to evaluate the left common femoral artery.  It was echolucent and pulsatile indicating it is patent .  An ultrasound image was acquired for the permanent record.  A micropuncture needle was used to access the left common femoral artery under direct ultrasound guidance.  The microwire  was then advanced under fluoroscopic guidance without difficulty followed by the micro-sheath.  A 0.035 J wire was advanced without resistance and a 5Fr sheath was placed.    Pigtail catheter was then advanced to the level of T12 and AP projection of the aorta was obtained. Pigtail catheter was then repositioned to above the bifurcation and LAO view of the pelvis was obtained. Stiff angled Glidewire and pigtail catheter was then used across the bifurcation and the catheter was positioned in the distal external iliac artery.  RAO of the right groin was then obtained. Wire was reintroduced and negotiated into the SFA and the catheter was advanced into the SFA. Distal runoff was then performed.  After review of the images the catheter was removed over wire and an LAO view of the groin was obtained. StarClose device was deployed without difficulty.   Findings:   Aortogram: The abdominal aorta is opacified the bolus injection of contrast.  There are mild atherosclerotic changes but no hemodynamically significant stenosis.  The bilateral common internal and external iliac arteries are widely patent.  There are no hemodynamically significant stenoses noted.  Right Lower Extremity: The right common femoral profunda femoris superficial femoral and popliteal demonstrate mild atherosclerotic changes but there are no hemodynamically significant stenoses.  The trifurcation is patent.  There is three-vessel runoff to the foot with the anterior tibial posterior tibial being codominant and both fill the pedal arch.  There are no hemodynamically significant strictures or stenoses of either the anterior tibial and posterior tibial.  The peroneal is small and diffusely diseased and does not contribute significantly.  Left Lower Extremity: Common femoral and visualized portions of the SFA and profunda femoris are widely patent.  No intervention is indicated at  this time   Disposition: Patient was taken to the recovery  room in stable condition having tolerated the procedure well.  [name] 01/08/2020,1:33 PM

## 2020-01-08 NOTE — Progress Notes (Signed)
CODE SEPSIS - PHARMACY COMMUNICATION  **Broad Spectrum Antibiotics should be administered within 1 hour of Sepsis diagnosis**  Time Code Sepsis Called/Page Received: 0650  Antibiotics Ordered: Cefepime and Vancomycin  Time of 1st antibiotic administration: 0549, Cefepime already given  Additional action taken by pharmacy: n/a  If necessary, Name of Provider/Nurse Contacted: n/a    Ena Dawley ,PharmD Clinical Pharmacist  01/08/2020  6:50 AM

## 2020-01-08 NOTE — H&P (View-Only) (Signed)
Reason for Consult: Cellulitis with abscess right foot. Referring Physician: Filomena Jungling [name] is an 86 y.o. female.  HPI: This is a 87 year old female with significant history of dementia.  History obtained through the chart reveals a burn to the right foot where she spilled hot coffee on the foot about 2 weeks ago.  Was seen in a telehealth visit and had been applying bandages to the foot.  Presented to the emergency department earlier today with dizziness and not feeling well.  Decision was made for admission and treatment.  Past Medical History:  Diagnosis Date  . At high risk for falls   . Blind   . Breast cancer ([place])   . Cancer ([place])   . Sherran Needs syndrome 2017  . GERD (gastroesophageal reflux disease)   . Hearing impaired   . Macular degeneration   . Malaria   . Osteoporosis     Past Surgical History:  Procedure Laterality Date  . BREAST LUMPECTOMY    . BREAST SURGERY    . INTRAMEDULLARY (IM) NAIL INTERTROCHANTERIC Right 04/30/2019   Procedure: INTRAMEDULLARY (IM) NAIL INTERTROCHANTRIC;  Surgeon: Thornton Park, MD;  Location: [facility] ORS;  Service: Orthopedics;  Laterality: Right;    History reviewed. No pertinent family history.  Social History:  reports that she has never smoked. Her smokeless tobacco use includes snuff. She reports that she does not drink alcohol or use drugs.  Allergies:  Allergies  Allergen Reactions  . Codeine Anaphylaxis and Other (See Comments)  . Penicillins Anaphylaxis and Nausea And Vomiting    Other reaction(s): Unknown  . Aspirin Nausea And Vomiting and Other (See Comments)    unknown  . Ativan [Lorazepam] Other (See Comments)    Agitation  . Codeine Sulfate Nausea And Vomiting  . Flu Virus Vaccine     Other reaction(s): Unknown  . Other Other (See Comments)  . Sulfa Antibiotics Other (See Comments) and Nausea And Vomiting    unknown  . Tetanus-Diphth-Acell Pertussis     Other reaction(s): Unknown    Medications:   Scheduled: . docusate sodium  100 mg Oral BID  . enoxaparin (LOVENOX) injection  30 mg Subcutaneous Q24H  . feeding supplement (ENSURE ENLIVE)  237 mL Oral BID BM  . ferrous sulfate  325 mg Oral TID PC  . mirtazapine  45 mg Oral QHS  . multivitamin with minerals  1 tablet Oral Daily  . pantoprazole  40 mg Oral Daily  . polyethylene glycol  17 g Oral Daily  . QUEtiapine  25 mg Oral QHS  . sodium chloride flush  3 mL Intravenous Q12H  . vancomycin variable dose per unstable renal function (pharmacist dosing)   Does not apply See admin instructions    Results for orders placed or performed during the hospital encounter of 01/08/20 (from the past 48 hour(s))  CBC with Differential/Platelet     Status: Abnormal   Collection Time: 01/08/20  5:45 AM  Result Value Ref Range   WBC 8.7 4.0 - 10.5 K/uL   RBC 3.42 (L) 3.87 - 5.11 MIL/uL   Hemoglobin 10.1 (L) 12.0 - 15.0 g/dL   HCT 30.4 (L) 36.0 - 46.0 %   MCV 88.9 80.0 - 100.0 fL   MCH 29.5 26.0 - 34.0 pg   MCHC 33.2 30.0 - 36.0 g/dL   RDW 12.5 11.5 - 15.5 %   Platelets 368 150 - 400 K/uL   nRBC 0.0 0.0 - 0.2 %   Neutrophils Relative % 74 %  Neutro Abs 6.5 1.7 - 7.7 K/uL   Lymphocytes Relative 17 %   Lymphs Abs 1.5 0.7 - 4.0 K/uL   Monocytes Relative 6 %   Monocytes Absolute 0.5 0.1 - 1.0 K/uL   Eosinophils Relative 1 %   Eosinophils Absolute 0.1 0.0 - 0.5 K/uL   Basophils Relative 1 %   Basophils Absolute 0.1 0.0 - 0.1 K/uL   Immature Granulocytes 1 %   Abs Immature Granulocytes 0.07 0.00 - 0.07 K/uL    Comment: Performed at [facility], [address] [place], [place] [address]  Comprehensive metabolic panel     Status: Abnormal   Collection Time: 01/08/20  5:45 AM  Result Value Ref Range   Sodium 128 (L) 135 - 145 mmol/L   Potassium 3.8 3.5 - 5.1 mmol/L   Chloride 94 (L) 98 - 111 mmol/L   CO2 21 (L) 22 - 32 mmol/L   Glucose, Bld 154 (H) 70 - 99 mg/dL    Comment: Glucose reference range applies only to samples  taken after fasting for at least 8 hours.   BUN 34 (H) 8 - 23 mg/dL   Creatinine, Ser 1.21 (H) 0.44 - 1.00 mg/dL   Calcium 8.9 8.9 - 10.3 mg/dL   Total Protein 6.9 6.5 - 8.1 g/dL   Albumin 3.2 (L) 3.5 - 5.0 g/dL   AST 24 15 - 41 U/L   ALT 14 0 - 44 U/L   Alkaline Phosphatase 122 38 - 126 U/L   Total Bilirubin 0.7 0.3 - 1.2 mg/dL   GFR calc non Af Amer 38 (L) >60 mL/min   GFR calc Af Amer 44 (L) >60 mL/min   Anion gap 13 5 - 15    Comment: Performed at [facility], [address] [place], [place] [address]  Lactic acid, plasma     Status: Abnormal   Collection Time: 01/08/20  5:45 AM  Result Value Ref Range   Lactic Acid, Venous 4.2 (HH) 0.5 - 1.9 mmol/L    Comment: CRITICAL RESULT CALLED TO, READ BACK BY AND VERIFIED WITH  ASHLEY ORSUTO AT [id number] 01/08/20 SDR Performed at [facility], [address] [place], [place] [address]   Blood culture (routine x 2)     Status: None (Preliminary result)   Collection Time: 01/08/20  5:45 AM   Specimen: BLOOD  Result Value Ref Range   Specimen Description BLOOD LEFT ARM    Special Requests      BOTTLES DRAWN AEROBIC AND ANAEROBIC Blood Culture results [place] not be optimal due to an excessive volume of blood received in culture bottles   Culture      NO GROWTH < 12 HOURS Performed at [facility], [address] [place], [place] [address]    Report Status PENDING   Blood culture (routine x 2)     Status: None (Preliminary result)   Collection Time: 01/08/20  5:46 AM   Specimen: BLOOD  Result Value Ref Range   Specimen Description BLOOD RIGHT AC    Special Requests      BOTTLES DRAWN AEROBIC AND ANAEROBIC Blood Culture results [place] not be optimal due to an excessive volume of blood received in culture bottles   Culture      NO GROWTH < 12 HOURS Performed at [facility], [address] [place], [place] [address]    Report Status PENDING   Protime-INR     Status: None   Collection Time: 01/08/20  5:46 AM  Result Value Ref Range   Prothrombin Time 14.9 11.4 - 15.2 seconds   INR 1.2 0.8 - 1.2    Comment: (NOTE) INR goal varies based on device and disease states. Performed at [facility], [address] [place], [place] [address]   APTT     Status: Abnormal   Collection Time: 01/08/20  5:46 AM  Result Value Ref Range   aPTT 37 (H) 24 - 36 seconds    Comment:        IF BASELINE aPTT IS ELEVATED, SUGGEST PATIENT RISK ASSESSMENT BE USED TO DETERMINE APPROPRIATE ANTICOAGULANT THERAPY. Performed at [facility], [address] [place], [place] [address]   Aerobic/Anaerobic Culture (surgical/deep wound)     Status: None (Preliminary result)   Collection Time: 01/08/20  6:14 AM   Specimen: Foot; Wound  Result Value Ref Range   Specimen Description      FOOT RIGHT Performed at [facility], [address] [place], [place] [address]    Special Requests      NONE Performed at [facility], [address] [place], [place] [address]    Gram Stain      RARE WBC PRESENT, PREDOMINANTLY PMN ABUNDANT GRAM NEGATIVE RODS ABUNDANT GRAM POSITIVE COCCI IN PAIRS IN CLUSTERS RARE GRAM POSITIVE RODS Performed at [facility], [place] [address], [place], [place] [address]    Culture PENDING    Report Status PENDING   Respiratory Panel by RT PCR (Flu A&B, Covid) - Nasopharyngeal Swab     Status: None   Collection Time: 01/08/20  6:14 AM   Specimen: Nasopharyngeal Swab  Result Value Ref Range   SARS Coronavirus 2 by RT PCR NEGATIVE NEGATIVE    Comment: (NOTE) SARS-CoV-2 target nucleic acids are NOT DETECTED. The SARS-CoV-2 RNA is generally detectable in upper respiratoy specimens during the acute phase of infection. The lowest concentration of SARS-CoV-2 viral copies this assay can detect is 131 copies/mL. A negative result does not preclude SARS-Cov-2 infection and should not be used as the sole basis for treatment or other patient  management decisions. A negative result [place] occur with  improper specimen collection/handling, submission of specimen other than nasopharyngeal swab, presence of viral mutation(s) within the areas targeted by this assay, and inadequate number of viral copies (<131 copies/mL). A negative result must be combined with clinical observations, patient history, and epidemiological information. The expected result is Negative. Fact Sheet for Patients:  PinkCheek.be Fact Sheet for Healthcare Providers:  GravelBags.it This test is not yet ap proved or cleared by the [place] FDA and  has been authorized for detection and/or diagnosis of SARS-CoV-2 by FDA under an Emergency Use Authorization (EUA). This EUA will remain  in effect (meaning this test can be used) for the duration of the COVID-19 declaration under Section 564(b)(1) of the Act, 21 U.S.C. section 360bbb-3(b)(1), unless the authorization is terminated or revoked sooner.    Influenza A by PCR NEGATIVE NEGATIVE   Influenza B by PCR NEGATIVE NEGATIVE    Comment: (NOTE) The Xpert Xpress SARS-CoV-2/FLU/RSV assay is intended as an aid in  the diagnosis of influenza from Nasopharyngeal swab specimens and  should not be used as a sole basis for treatment. Nasal washings and  aspirates are unacceptable for Xpert Xpress SARS-CoV-2/FLU/RSV  testing. Fact Sheet for Patients: PinkCheek.be Fact Sheet for Healthcare Providers: GravelBags.it This test is not yet approved or cleared by the [place] FDA and  has been  authorized for detection and/or diagnosis of SARS-CoV-2 by  FDA under an Emergency Use Authorization (EUA). This EUA will remain  in effect (meaning this test can be used) for the duration of the  Covid-19 declaration under Section 564(b)(1) of the Act, 21  U.S.C. section 360bbb-3(b)(1), unless the authorization  is  terminated or revoked. Performed at [facility], [address] [place], [place] [address]   Lactic acid, plasma     Status: Abnormal   Collection Time: 01/08/20  7:49 AM  Result Value Ref Range   Lactic Acid, Venous 2.8 (HH) 0.5 - 1.9 mmol/L    Comment: CRITICAL VALUE NOTED. VALUE IS CONSISTENT WITH PREVIOUSLY REPORTED/CALLED VALUE  SDR Performed at [facility], [place],  [address]     PERIPHERAL VASCULAR CATHETERIZATION  Result Date: 01/08/2020 See op note  DG Foot Complete Right  Result Date: 01/08/2020 CLINICAL DATA:  Infected wound on top of right foot EXAM: RIGHT FOOT COMPLETE - 3+ VIEW COMPARISON:  None. FINDINGS: Soft tissue swelling with small bubbles of superficial gas along the dorsal forefoot. No underlying erosion or opaque foreign body. Osteopenia. No fracture or malalignment. IMPRESSION: Swelling and 2 bubbles of superficial gas at the dorsal forefoot. No opaque foreign body or acute osseous finding. Electronically Signed   By: Monte Fantasia M.D.   On: 01/08/2020 06:00    Review of Systems  Unable to perform ROS: Dementia   Blood pressure (!) 155/64, pulse 72, temperature 98.3 F (36.8 C), temperature source Oral, resp. rate 14, height 5\' 3"  (1.6 m), weight 47.8 kg, SpO2 100 %. Physical Exam  Cardiovascular:  DP and PT pulses are fully palpable.  Musculoskeletal:     Comments: Guarded and painful range of motion in the right foot.  Muscle testing deferred.  Neurological:  Unable to evaluate with her dementia but she does have a significant pain response  Skin:  The skin is thin dry and atrophic.  Significant edema with some erythema in the right lower extremity with a full-thickness necrotic ulceration on the dorsum of the right foot measuring approximately 5 cm x 3 cm with some expressible purulence.  Underlying abscess noted.    Assessment/Plan: Assessment: Full-thickness ulceration with cellulitis and  abscess right foot.  Plan: Saline wet-to-dry gauze applied to the right foot followed by Kerlix.  I spoke with the patient's daughter by phone who is her power of attorney.  Discussed options including debridement of the foot which she elects to proceed with.  We will plan on this tomorrow.  Discussed with the daughter that due to the extent of infection that depending on how she responds that she Beeney have to undergo below-knee amputation of the right lower extremity.  Discussed with the daughter also that her circulation appears to be in good shape.  Discussed risks and complications of the surgery possible which include continued infection and nonhealing of the wound which [place] necessitate further surgery.  No guarantees could be given as to the outcome.  We will obtain consent for I&D right foot.  N.p.o. after midnight.  Plan for surgery tomorrow afternoon.  [name] 01/08/2020, 7:52 PM

## 2020-01-08 NOTE — Progress Notes (Signed)
PHARMACIST - PHYSICIAN COMMUNICATION  CONCERNING:  Enoxaparin (Lovenox) for DVT Prophylaxis    RECOMMENDATION: Patient was prescribed enoxaprin 40mg  q24 hours for VTE prophylaxis.   Filed Weights   01/08/20 0601  Weight: 105 lb 6.1 oz (47.8 kg)    Body mass index is 18.67 kg/m.  Estimated Creatinine Clearance: 21.5 mL/min (A) (by C-G formula based on SCr of 1.21 mg/dL (H)).  Patient is candidate for enoxaparin 30mg  every 24 hours based on CrCl <32ml/min or Weight less then 45kg for women or <57kg for men   DESCRIPTION: Pharmacy has adjusted enoxaparin dose per [facility] policy.  Patient is now receiving enoxaparin 30mg  every 24 hours.  Rowland Lathe, PharmD Clinical Pharmacist  01/08/2020 9:02 AM

## 2020-01-08 NOTE — ED Notes (Signed)
Patients daughter called and given update and informed her that she can come visit.   Daughter's phone number [phone]

## 2020-01-08 NOTE — Progress Notes (Signed)
Patient received from ED, report verbally called by Olen Cordial, RN to this RN.  Patient received in completely altered state.  Patient was 77%  O2 Saturation on RA, Lung sounds diminished, therefore this RN applied 4LNC, paged Respiratory, and had a forehead probe applied to ensure adequate reading, patient was 95% on 4LNC.  External catheter placed for wound integrity, as well as altered status.  Patient arrived with brief, shorts, and bed saturated with feces and urine, with feces caked into skin.  After cleaning patient there were Two Stage II Pressure ulcers on R&L Gluteal folds that were saturated with feces as well.  This RN cleansed and dresses wounds per protocol, as well as washed patient from head to toe.  Left forearm IV infiltrated and IV fluids leaking around site.  IV removed per protocol, and warm compress applied to arm.  Order placed by this RN for low lying bed, wound on R foot noted to be foul smelling, draining, and green purulent drainage.  Patient appears to be resting peacefully in bed before report called to Special Procedures for pending procedure.  Charge nurse aware of admission status and patient condition.

## 2020-01-08 NOTE — Consult Note (Signed)
Reason for Consult: Cellulitis with abscess right foot. Referring Physician: Filomena Jungling A Schillaci is an 86 y.o. female.  HPI: This is a 86 year old female with significant history of dementia.  History obtained through the chart reveals a burn to the right foot where she spilled hot coffee on the foot about 2 weeks ago.  Was seen in a telehealth visit and had been applying bandages to the foot.  Presented to the emergency department earlier today with dizziness and not feeling well.  Decision was made for admission and treatment.  Past Medical History:  Diagnosis Date  . At high risk for falls   . Blind   . Breast cancer ([place])   . Cancer ([place])   . Sherran Needs syndrome 2017  . GERD (gastroesophageal reflux disease)   . Hearing impaired   . Macular degeneration   . Malaria   . Osteoporosis     Past Surgical History:  Procedure Laterality Date  . BREAST LUMPECTOMY    . BREAST SURGERY    . INTRAMEDULLARY (IM) NAIL INTERTROCHANTERIC Right 04/30/2019   Procedure: INTRAMEDULLARY (IM) NAIL INTERTROCHANTRIC;  Surgeon: Thornton Park, MD;  Location: [facility] ORS;  Service: Orthopedics;  Laterality: Right;    History reviewed. No pertinent family history.  Social History:  reports that she has never smoked. Her smokeless tobacco use includes snuff. She reports that she does not drink alcohol or use drugs.  Allergies:  Allergies  Allergen Reactions  . Codeine Anaphylaxis and Other (See Comments)  . Penicillins Anaphylaxis and Nausea And Vomiting    Other reaction(s): Unknown  . Aspirin Nausea And Vomiting and Other (See Comments)    unknown  . Ativan [Lorazepam] Other (See Comments)    Agitation  . Codeine Sulfate Nausea And Vomiting  . Flu Virus Vaccine     Other reaction(s): Unknown  . Other Other (See Comments)  . Sulfa Antibiotics Other (See Comments) and Nausea And Vomiting    unknown  . Tetanus-Diphth-Acell Pertussis     Other reaction(s): Unknown    Medications:   Scheduled: . docusate sodium  100 mg Oral BID  . enoxaparin (LOVENOX) injection  30 mg Subcutaneous Q24H  . feeding supplement (ENSURE ENLIVE)  237 mL Oral BID BM  . ferrous sulfate  325 mg Oral TID PC  . mirtazapine  45 mg Oral QHS  . multivitamin with minerals  1 tablet Oral Daily  . pantoprazole  40 mg Oral Daily  . polyethylene glycol  17 g Oral Daily  . QUEtiapine  25 mg Oral QHS  . sodium chloride flush  3 mL Intravenous Q12H  . vancomycin variable dose per unstable renal function (pharmacist dosing)   Does not apply See admin instructions    Results for orders placed or performed during the hospital encounter of 01/08/20 (from the past 48 hour(s))  CBC with Differential/Platelet     Status: Abnormal   Collection Time: 01/08/20  5:45 AM  Result Value Ref Range   WBC 8.7 4.0 - 10.5 K/uL   RBC 3.42 (L) 3.87 - 5.11 MIL/uL   Hemoglobin 10.1 (L) 12.0 - 15.0 g/dL   HCT 30.4 (L) 36.0 - 46.0 %   MCV 88.9 80.0 - 100.0 fL   MCH 29.5 26.0 - 34.0 pg   MCHC 33.2 30.0 - 36.0 g/dL   RDW 12.5 11.5 - 15.5 %   Platelets 368 150 - 400 K/uL   nRBC 0.0 0.0 - 0.2 %   Neutrophils Relative % 74 %  Neutro Abs 6.5 1.7 - 7.7 K/uL   Lymphocytes Relative 17 %   Lymphs Abs 1.5 0.7 - 4.0 K/uL   Monocytes Relative 6 %   Monocytes Absolute 0.5 0.1 - 1.0 K/uL   Eosinophils Relative 1 %   Eosinophils Absolute 0.1 0.0 - 0.5 K/uL   Basophils Relative 1 %   Basophils Absolute 0.1 0.0 - 0.1 K/uL   Immature Granulocytes 1 %   Abs Immature Granulocytes 0.07 0.00 - 0.07 K/uL    Comment: Performed at [facility], [address] [place], [place] [address]  Comprehensive metabolic panel     Status: Abnormal   Collection Time: 01/08/20  5:45 AM  Result Value Ref Range   Sodium 128 (L) 135 - 145 mmol/L   Potassium 3.8 3.5 - 5.1 mmol/L   Chloride 94 (L) 98 - 111 mmol/L   CO2 21 (L) 22 - 32 mmol/L   Glucose, Bld 154 (H) 70 - 99 mg/dL    Comment: Glucose reference range applies only to samples  taken after fasting for at least 8 hours.   BUN 34 (H) 8 - 23 mg/dL   Creatinine, Ser 1.21 (H) 0.44 - 1.00 mg/dL   Calcium 8.9 8.9 - 10.3 mg/dL   Total Protein 6.9 6.5 - 8.1 g/dL   Albumin 3.2 (L) 3.5 - 5.0 g/dL   AST 24 15 - 41 U/L   ALT 14 0 - 44 U/L   Alkaline Phosphatase 122 38 - 126 U/L   Total Bilirubin 0.7 0.3 - 1.2 mg/dL   GFR calc non Af Amer 38 (L) >60 mL/min   GFR calc Af Amer 44 (L) >60 mL/min   Anion gap 13 5 - 15    Comment: Performed at [facility], [address] [place], [place] [address]  Lactic acid, plasma     Status: Abnormal   Collection Time: 01/08/20  5:45 AM  Result Value Ref Range   Lactic Acid, Venous 4.2 (HH) 0.5 - 1.9 mmol/L    Comment: CRITICAL RESULT CALLED TO, READ BACK BY AND VERIFIED WITH  ASHLEY ORSUTO AT [id number] 01/08/20 SDR Performed at [facility], [address] [place], [place] [address]   Blood culture (routine x 2)     Status: None (Preliminary result)   Collection Time: 01/08/20  5:45 AM   Specimen: BLOOD  Result Value Ref Range   Specimen Description BLOOD LEFT ARM    Special Requests      BOTTLES DRAWN AEROBIC AND ANAEROBIC Blood Culture results [place] not be optimal due to an excessive volume of blood received in culture bottles   Culture      NO GROWTH < 12 HOURS Performed at [facility], [address], [place], [place] [address]    Report Status PENDING   Blood culture (routine x 2)     Status: None (Preliminary result)   Collection Time: 01/08/20  5:46 AM   Specimen: BLOOD  Result Value Ref Range   Specimen Description BLOOD RIGHT AC    Special Requests      BOTTLES DRAWN AEROBIC AND ANAEROBIC Blood Culture results [place] not be optimal due to an excessive volume of blood received in culture bottles   Culture      NO GROWTH < 12 HOURS Performed at [facility], [address], [place], [place] [address]    Report Status PENDING   Protime-INR     Status: None   Collection Time: 01/08/20  5:46 AM  Result Value Ref Range   Prothrombin Time 14.9 11.4 - 15.2 seconds   INR 1.2 0.8 - 1.2    Comment: (NOTE) INR goal varies based on device and disease states. Performed at [facility], [address] [place], [place] [address]   APTT     Status: Abnormal   Collection Time: 01/08/20  5:46 AM  Result Value Ref Range   aPTT 37 (H) 24 - 36 seconds    Comment:        IF BASELINE aPTT IS ELEVATED, SUGGEST PATIENT RISK ASSESSMENT BE USED TO DETERMINE APPROPRIATE ANTICOAGULANT THERAPY. Performed at [facility], [address] [place], [place] [address]   Aerobic/Anaerobic Culture (surgical/deep wound)     Status: None (Preliminary result)   Collection Time: 01/08/20  6:14 AM   Specimen: Foot; Wound  Result Value Ref Range   Specimen Description      FOOT RIGHT Performed at [facility], [address] [place], [place] [address]    Special Requests      NONE Performed at [facility], [address] [place], [place] [address]    Gram Stain      RARE WBC PRESENT, PREDOMINANTLY PMN ABUNDANT GRAM NEGATIVE RODS ABUNDANT GRAM POSITIVE COCCI IN PAIRS IN CLUSTERS RARE GRAM POSITIVE RODS Performed at [facility], [place] [address], [place], [place] [address]    Culture PENDING    Report Status PENDING   Respiratory Panel by RT PCR (Flu A&B, Covid) - Nasopharyngeal Swab     Status: None   Collection Time: 01/08/20  6:14 AM   Specimen: Nasopharyngeal Swab  Result Value Ref Range   SARS Coronavirus 2 by RT PCR NEGATIVE NEGATIVE    Comment: (NOTE) SARS-CoV-2 target nucleic acids are NOT DETECTED. The SARS-CoV-2 RNA is generally detectable in upper respiratoy specimens during the acute phase of infection. The lowest concentration of SARS-CoV-2 viral copies this assay can detect is 131 copies/mL. A negative result does not preclude SARS-Cov-2 infection and should not be used as the sole basis for treatment or other patient  management decisions. A negative result [place] occur with  improper specimen collection/handling, submission of specimen other than nasopharyngeal swab, presence of viral mutation(s) within the areas targeted by this assay, and inadequate number of viral copies (<131 copies/mL). A negative result must be combined with clinical observations, patient history, and epidemiological information. The expected result is Negative. Fact Sheet for Patients:  PinkCheek.be Fact Sheet for Healthcare Providers:  GravelBags.it This test is not yet ap proved or cleared by the [place] FDA and  has been authorized for detection and/or diagnosis of SARS-CoV-2 by FDA under an Emergency Use Authorization (EUA). This EUA will remain  in effect (meaning this test can be used) for the duration of the COVID-19 declaration under Section 564(b)(1) of the Act, 21 U.S.C. section 360bbb-3(b)(1), unless the authorization is terminated or revoked sooner.    Influenza A by PCR NEGATIVE NEGATIVE   Influenza B by PCR NEGATIVE NEGATIVE    Comment: (NOTE) The Xpert Xpress SARS-CoV-2/FLU/RSV assay is intended as an aid in  the diagnosis of influenza from Nasopharyngeal swab specimens and  should not be used as a sole basis for treatment. Nasal washings and  aspirates are unacceptable for Xpert Xpress SARS-CoV-2/FLU/RSV  testing. Fact Sheet for Patients: PinkCheek.be Fact Sheet for Healthcare Providers: GravelBags.it This test is not yet approved or cleared by the [place] FDA and  has been  authorized for detection and/or diagnosis of SARS-CoV-2 by  FDA under an Emergency Use Authorization (EUA). This EUA will remain  in effect (meaning this test can be used) for the duration of the  Covid-19 declaration under Section 564(b)(1) of the Act, 21  U.S.C. section 360bbb-3(b)(1), unless the authorization  is  terminated or revoked. Performed at [facility], [address] [place], [place] [address]   Lactic acid, plasma     Status: Abnormal   Collection Time: 01/08/20  7:49 AM  Result Value Ref Range   Lactic Acid, Venous 2.8 (HH) 0.5 - 1.9 mmol/L    Comment: CRITICAL VALUE NOTED. VALUE IS CONSISTENT WITH PREVIOUSLY REPORTED/CALLED VALUE  SDR Performed at [facility], [place], [place] [address]     PERIPHERAL VASCULAR CATHETERIZATION  Result Date: 01/08/2020 See op note  DG Foot Complete Right  Result Date: 01/08/2020 CLINICAL DATA:  Infected wound on top of right foot EXAM: RIGHT FOOT COMPLETE - 3+ VIEW COMPARISON:  None. FINDINGS: Soft tissue swelling with small bubbles of superficial gas along the dorsal forefoot. No underlying erosion or opaque foreign body. Osteopenia. No fracture or malalignment. IMPRESSION: Swelling and 2 bubbles of superficial gas at the dorsal forefoot. No opaque foreign body or acute osseous finding. Electronically Signed   By: Monte Fantasia M.D.   On: 01/08/2020 06:00    Review of Systems  Unable to perform ROS: Dementia   Blood pressure (!) 155/64, pulse 72, temperature 98.3 F (36.8 C), temperature source Oral, resp. rate 14, height 5\' 3"  (1.6 m), weight 47.8 kg, SpO2 100 %. Physical Exam  Cardiovascular:  DP and PT pulses are fully palpable.  Musculoskeletal:     Comments: Guarded and painful range of motion in the right foot.  Muscle testing deferred.  Neurological:  Unable to evaluate with her dementia but she does have a significant pain response  Skin:  The skin is thin dry and atrophic.  Significant edema with some erythema in the right lower extremity with a full-thickness necrotic ulceration on the dorsum of the right foot measuring approximately 5 cm x 3 cm with some expressible purulence.  Underlying abscess noted.    Assessment/Plan: Assessment: Full-thickness ulceration with cellulitis and  abscess right foot.  Plan: Saline wet-to-dry gauze applied to the right foot followed by Kerlix.  I spoke with the patient's daughter by phone who is her power of attorney.  Discussed options including debridement of the foot which she elects to proceed with.  We will plan on this tomorrow.  Discussed with the daughter that due to the extent of infection that depending on how she responds that she Dozal have to undergo below-knee amputation of the right lower extremity.  Discussed with the daughter also that her circulation appears to be in good shape.  Discussed risks and complications of the surgery possible which include continued infection and nonhealing of the wound which [place] necessitate further surgery.  No guarantees could be given as to the outcome.  We will obtain consent for I&D right foot.  N.p.o. after midnight.  Plan for surgery tomorrow afternoon.  [name] 01/08/2020, 7:52 PM

## 2020-01-08 NOTE — ED Notes (Addendum)
Ok to start antibiotics without obtaining urine samples per Dr. Alfred Levins.

## 2020-01-08 NOTE — ED Provider Notes (Signed)
[facility] Emergency Department Provider Note  ____________________________________________  Time seen: Approximately 5:47 AM  I have reviewed the triage vital signs and the nursing notes.   HISTORY  Chief Complaint Weakness and Wound Check  Level 5 caveat:  Portions of the history and physical were unable to be obtained due to dementia   HPI Angel Brandt is a 86 y.o. female with a history of dementia, macular degeneration and legally blind, breast cancer, TIA, hearing impaired who presents from home for dizziness.  Patient was in her usual state of health when she went to bed last night.  Woke up this morning to go to the bathroom and told her daughter she felt like she was going to pass out.  Sat on her walker and was unable to stand up without assistance.  When EMS arrived noticed the patient had dressing around her right foot.  According to the daughter, patient dropped hot coffee onto her right foot 2 weeks ago.  They had a virtual visit with a medical provider as soon as that happened and she was diagnosed with a second-degree burn.  They were told to wrap the wound.  The wound has been wrapped ever since.  No fever or vomiting.  Patient has no complaints at this time and denies foot pain, dizziness, CP, SOB, abdominal pain, or nausea.   Past Medical History:  Diagnosis Date  . At high risk for falls   . Blind   . Breast cancer ([place])   . Cancer ([place])   . Sherran Needs syndrome 2017  . GERD (gastroesophageal reflux disease)   . Hearing impaired   . Macular degeneration   . Malaria   . Osteoporosis     Patient Active Problem List   Diagnosis Date Noted  . Protein-calorie malnutrition, severe 04/30/2019  . Closed right hip fracture, initial encounter ([place]) 04/29/2019  . TIA (transient ischemic attack) 10/18/2017  . Carcinoma of overlapping sites of right breast in female, estrogen receptor positive ([place]) 02/10/2017  . Diverticulitis 02/10/2016    . H/O malaria 02/10/2016  . Degeneration macular 02/10/2016  . 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 Gait instability 12/04/2014    Past Surgical History:  Procedure Laterality Date  . BREAST LUMPECTOMY    . BREAST SURGERY    . INTRAMEDULLARY (IM) NAIL INTERTROCHANTERIC Right 04/30/2019   Procedure: INTRAMEDULLARY (IM) NAIL INTERTROCHANTRIC;  Surgeon: Thornton Park, MD;  Location: [facility] ORS;  Service: Orthopedics;  Laterality: Right;    Prior to Admission medications   Medication Sig Start Date End Date Taking? Authorizing Provider  acetaminophen (TYLENOL) 500 MG tablet Take 1,000 mg by mouth every 6 (six) hours as needed for mild pain.    [provider]  aspirin EC 81 MG EC tablet Take 1 tablet (81 mg total) by mouth daily. 10/20/17   Loletha Grayer, MD  docusate sodium (COLACE) 100 MG capsule Take 1 capsule (100 mg total) by mouth 2 (two) times daily. 05/03/19   Vaughan Basta, MD  enoxaparin (LOVENOX) 40 MG/0.4ML injection Inject 0.4 mLs (40 mg total) into the skin daily for 20 days. 05/04/19 05/24/19  Vaughan Basta, MD  feeding supplement, ENSURE ENLIVE, (ENSURE ENLIVE) LIQD Take 237 mLs by mouth 2 (two) times daily between meals. 05/03/19   Vaughan Basta, MD  ferrous sulfate 325 (65 FE) MG tablet Take 1 tablet (325 mg total) by mouth 3 (three) times daily after meals. 05/03/19   Vaughan Basta, MD  HYDROcodone-acetaminophen (NORCO/VICODIN) 5-325 MG tablet  Take 1-2 tablets by mouth every 6 (six) hours as needed for severe pain (pain score 4-6). 05/03/19   Vaughan Basta, MD  mirtazapine (REMERON) 45 MG tablet Take 45 mg by mouth at bedtime.  05/27/15   [provider]  Multiple Vitamin (MULTIVITAMIN WITH MINERALS) TABS tablet Take 1 tablet by mouth daily. 05/04/19   Vaughan Basta, MD  omeprazole (PRILOSEC) 20 MG capsule Take 20 mg by mouth 2 (two) times daily before a meal.  12/23/15   [provider]  polyethylene  glycol (MIRALAX / GLYCOLAX) 17 g packet Take 17 g by mouth daily. 05/04/19   Vaughan Basta, MD  QUEtiapine (SEROQUEL) 25 MG tablet Take 25 mg by mouth at bedtime.    [provider]  traMADol (ULTRAM) 50 MG tablet Take 1 tablet (50 mg total) by mouth every 12 (twelve) hours as needed for moderate pain. 05/03/19   Vaughan Basta, MD    Allergies Codeine, Penicillins, Aspirin, Ativan [lorazepam], Codeine sulfate, Flu virus vaccine, Other, Sulfa antibiotics, and Tetanus-diphth-acell pertussis  History reviewed. No pertinent family history.  Social History Social History   Tobacco Use  . Smoking status: Never Smoker  . Smokeless tobacco: Current User    Types: Snuff  Substance Use Topics  . Alcohol use: No  . Drug use: No    Review of Systems  Constitutional: Negative for fever. + dizziness Eyes: Negative for visual changes. ENT: Negative for sore throat. Neck: No neck pain  Cardiovascular: Negative for chest pain. Respiratory: Negative for shortness of breath. Gastrointestinal: Negative for abdominal pain, vomiting or diarrhea. Genitourinary: Negative for dysuria. Musculoskeletal: Negative for back pain. + foot burn Skin: Negative for rash. Neurological: Negative for headaches, weakness or numbness. Psych: No SI or HI  ____________________________________________   PHYSICAL EXAM:  VITAL SIGNS:  Vitals:   01/08/20 0600 01/08/20 0630  BP: 131/60 111/76  Pulse:    Resp: 17 16  Temp:    SpO2:  100%    Constitutional: Alert and oriented to self only. Well appearing and in no apparent distress. HEENT:      Head: Normocephalic and atraumatic.         Eyes: Conjunctivae are normal. Sclera is non-icteric.       Mouth/Throat: Mucous membranes are moist.       Neck: Supple with no signs of meningismus. Cardiovascular: Regular rate and rhythm. No murmurs, gallops, or rubs. Respiratory: Normal respiratory effort. Lungs are clear to auscultation  bilaterally. No wheezes, crackles, or rhonchi.  Gastrointestinal: Soft, non tender, and non distended  Musculoskeletal: There is a large wound involving the dorsum of the right foot with large amount of pus, deep tissue exposure, surrounding necrotic borders, and foul fruity odor. Distal capillary refill is preserved. There is small amount of surrounding erythema and warmth and swelling of the foot and ankle.  Neurologic: Normal speech and language. Face is symmetric. Moving all extremities. No gross focal neurologic deficits are appreciated. Skin: Skin is warm, dry and intact. No rash noted. Psychiatric: Mood and affect are normal. Speech and behavior are normal.      ____________________________________________   LABS (all labs ordered are listed, but only abnormal results are displayed)  Labs Reviewed  CBC WITH DIFFERENTIAL/PLATELET - Abnormal; Notable for the following components:      Result Value   RBC 3.42 (*)    Hemoglobin 10.1 (*)    HCT 30.4 (*)    All other components within normal limits  COMPREHENSIVE METABOLIC PANEL - Abnormal;  Notable for the following components:   Sodium 128 (*)    Chloride 94 (*)    CO2 21 (*)    Glucose, Bld 154 (*)    BUN 34 (*)    Creatinine, Ser 1.21 (*)    Albumin 3.2 (*)    GFR calc non Af Amer 38 (*)    GFR calc Af Amer 44 (*)    All other components within normal limits  LACTIC ACID, PLASMA - Abnormal; Notable for the following components:   Lactic Acid, Venous 4.2 (*)    All other components within normal limits  CULTURE, BLOOD (ROUTINE X 2)  CULTURE, BLOOD (ROUTINE X 2)  AEROBIC/ANAEROBIC CULTURE (SURGICAL/DEEP WOUND)  RESPIRATORY PANEL BY RT PCR (FLU A&B, COVID)  URINE CULTURE  URINALYSIS, ROUTINE W REFLEX MICROSCOPIC  PROTIME-INR  APTT   ____________________________________________  EKG  ED ECG REPORT I, Rudene Re, the attending physician, personally viewed and interpreted this ECG.  Sinus tachycardia, rate of  102, normal intervals, normal axis, LVH, no ST elevations or depressions. ____________________________________________  RADIOLOGY  I have personally reviewed the images performed during this visit and I agree with the Radiologist's read.   Interpretation by Radiologist:  DG Foot Complete Right  Result Date: 01/08/2020 CLINICAL DATA:  Infected wound on top of right foot EXAM: RIGHT FOOT COMPLETE - 3+ VIEW COMPARISON:  None. FINDINGS: Soft tissue swelling with small bubbles of superficial gas along the dorsal forefoot. No underlying erosion or opaque foreign body. Osteopenia. No fracture or malalignment. IMPRESSION: Swelling and 2 bubbles of superficial gas at the dorsal forefoot. No opaque foreign body or acute osseous finding. Electronically Signed   By: Monte Fantasia M.D.   On: 01/08/2020 06:00      ____________________________________________   PROCEDURES  Procedure(s) performed: None Procedures Critical Care performed: yes  CRITICAL CARE Performed by: Rudene Re  ?  Total critical care time: 40 min  Critical care time was exclusive of separately billable procedures and treating other patients.  Critical care was necessary to treat or prevent imminent or life-threatening deterioration.  Critical care was time spent personally by me on the following activities: development of treatment plan with patient and/or surrogate as well as nursing, discussions with consultants, evaluation of patient's response to treatment, examination of patient, obtaining history from patient or surrogate, ordering and performing treatments and interventions, ordering and review of laboratory studies, ordering and review of radiographic studies, pulse oximetry and re-evaluation of patient's condition.  ____________________________________________   INITIAL IMPRESSION / ASSESSMENT AND PLAN / ED COURSE  86 y.o. female with a history of dementia, macular degeneration and legally blind,  breast cancer, TIA, hearing impaired who presents from home for dizziness. Patient arrives with R foot wrapped from a 2nd degree burn injury sustained 2 weeks ago. Very strong foul fruity odor concerning for pseudomonas infection. Please refer to picture and exam above for details on the wound.  Ddx sepsis, osteomyelitis, bacteremia, cellulitis, wound infection, necrotizing infection.  Plan for labs, XR, wound culture, IV abx, admission for wound care.   _________________________ 6:44 AM on 01/08/2020 -----------------------------------------  XR with two small area of subq air concerning for early necrotizing infection. Discussed with Dr. Hampton Abbot who recommended consulting podiatry. Discussed with Dr. Cleda Mccreedy findings on exam, subq air on XR, elevated lactic, and tachycardia concerning for early sepsis and early necrotizing infection. Dr. Cleda Mccreedy agreed on antibiotic choice, admission to hospitalist, and recommended making patient NPO after breakfast for possible OR debridement this afternoon.  No  fever but mild tachycardia.  No leukocytosis but elevated lactic at 4.5 with acute kidney injury and hyponatremia.  Possibly early sepsis.  Patient started on sepsis protocol.  2 L bolus of LR, cultures are pending.  Patient given cefepime and Vanco.  Patient to be admitted to the hospitalist service.     _____________________________________________ Please note:  Patient was evaluated in Emergency Department today for the symptoms described in the history of present illness. Patient was evaluated in the context of the global COVID-19 pandemic, which necessitated consideration that the patient might be at risk for infection with the SARS-CoV-2 virus that causes COVID-19. Institutional protocols and algorithms that pertain to the evaluation of patients at risk for COVID-19 are in a state of rapid change based on information released by regulatory bodies including the CDC and federal and state organizations.  These policies and algorithms were followed during the patient's care in the ED.  Some ED evaluations and interventions Elena be delayed as a result of limited staffing during the pandemic.   ____________________________________________   FINAL CLINICAL IMPRESSION(S) / ED DIAGNOSES   Final diagnoses:  Complicated wound infection  AKI (acute kidney injury) ([place])  Hyponatremia  Sepsis with acute renal failure without septic shock, due to unspecified organism, unspecified acute renal failure type ([place])      NEW MEDICATIONS STARTED DURING THIS VISIT:  ED Discharge Orders    None       Note:  This document was prepared using Dragon voice recognition software and [employer] include unintentional dictation errors.    Alfred Levins, [place], MD 01/08/20 [id number]

## 2020-01-08 NOTE — ED Notes (Signed)
Pt declined breakfast and AM meds. This RN informed pt that due to afternoon procedure, this would be the last opportunity pt has to eat before then. Pt acknowledged and declined

## 2020-01-08 NOTE — ED Triage Notes (Signed)
Pt arrived via [facility] from home with reports of weakness and near syncope when going to the bathroom, Pt is legally blind, usually up to bathroom with minimal to no assistance, felt like she was going to pass out and to sit on walker.  Per EMS, pt was unable to hold herself up.  Pt also has snuff in her mouth.   Per EMS, pt has wound to right foot from coffee spilling 2 weeks ago. Pt was seen for a telehealth visit and prescribed doxycycline. On arrival wound had foul odor with pus-like drainage.

## 2020-01-08 NOTE — Consult Note (Signed)
[facility] Consult Note  MRN : [id number]  Angel Brandt is a 86 y.o. (09/28/1924) female who presents with chief complaint of  Chief Complaint  Patient presents with  . Weakness  . Wound Check   History of Present Illness:  Patient with a past medical history of dementia and is only oriented to self upon presentation.  Information for this consult was obtained from previous EPIC notation and other clinical team members.  Angel Brandt is a 86 y.o. female with medical history significant for macular degeneration, she is legally blind, history of breast CA, dementia, TIA and hearing impairment who was brought in to the ER for evaluation of weakness and dizziness. Per her daughter she woke up on the morning of her admission and complained of feeling dizzy like she would pass out. EMS was called and they noted a foul smell coming from her right foot. Per patient's daughter she spilled coffee on her right foot about 2 weeks ago and they had a virtual visit with her primary care provider who recommended local wound care.  He had recommended a 10-day course of oral doxycycline which patient has almost completed.  Her daughter had noticed worsening of the wound with increasing redness and blister formation. But states that her mother refused to come to the hospital.   Upon interview and examining the patient she was only oriented to self.  She was not able to give a history in regard to her wound.  Patient was admitted to the hospital with chronic second-degree burn of the right foot with superimposed infection and cellulitis, dehydration, hyponatremia, anemia of chronic disease and possible sepsis.  Vascular surgery was consulted by Dr. Francine Graven for further recommendations.  Current Facility-Administered Medications  Medication Dose Route Frequency Provider Last Rate Last Admin  . 0.9 %  sodium chloride infusion   Intravenous Continuous Agbata, Tochukwu, MD 100 mL/hr  at 01/08/20 0931 New Bag at 01/08/20 0931  . acetaminophen (TYLENOL) tablet 1,000 mg  1,000 mg Oral Q6H PRN Agbata, Tochukwu, MD      . Derrill Memo ON 01/09/2020] ceFEPIme (MAXIPIME) 2 g in sodium chloride 0.9 % 100 mL IVPB  2 g Intravenous Q24H Rowland Lathe, RPH      . docusate sodium (COLACE) capsule 100 mg  100 mg Oral BID Agbata, Tochukwu, MD      . enoxaparin (LOVENOX) injection 30 mg  30 mg Subcutaneous Q24H Agbata, Tochukwu, MD      . feeding supplement (ENSURE ENLIVE) (ENSURE ENLIVE) liquid 237 mL  237 mL Oral BID BM Agbata, Tochukwu, MD      . ferrous sulfate tablet 325 mg  325 mg Oral TID PC Agbata, Tochukwu, MD      . mirtazapine (REMERON) tablet 45 mg  45 mg Oral QHS Agbata, Tochukwu, MD      . multivitamin with minerals tablet 1 tablet  1 tablet Oral Daily Agbata, Tochukwu, MD      . ondansetron (ZOFRAN) tablet 4 mg  4 mg Oral Q6H PRN Agbata, Tochukwu, MD       Or  . ondansetron (ZOFRAN) injection 4 mg  4 mg Intravenous Q6H PRN Agbata, Tochukwu, MD      . pantoprazole (PROTONIX) EC tablet 40 mg  40 mg Oral Daily Agbata, Tochukwu, MD      . polyethylene glycol (MIRALAX / GLYCOLAX) packet 17 g  17 g Oral Daily Agbata, Tochukwu, MD      . QUEtiapine (SEROQUEL) tablet  25 mg  25 mg Oral QHS Agbata, Tochukwu, MD      . vancomycin variable dose per unstable renal function (pharmacist dosing)   Does not apply See admin instructions Agbata, Tochukwu, MD       Current Outpatient Medications  Medication Sig Dispense Refill  . aspirin EC 81 MG EC tablet Take 1 tablet (81 mg total) by mouth daily. 30 tablet 0  . mirtazapine (REMERON) 45 MG tablet Take 45 mg by mouth at bedtime.     . Multiple Vitamin (MULTIVITAMIN WITH MINERALS) TABS tablet Take 1 tablet by mouth daily. 30 tablet 0  . omeprazole (PRILOSEC) 20 MG capsule Take 20 mg by mouth 2 (two) times daily before a meal.     . polyethylene glycol (MIRALAX / GLYCOLAX) 17 g packet Take 17 g by mouth daily. (Patient taking differently: Take 17 g  by mouth daily as needed for mild constipation. ) 14 each 0  . QUEtiapine (SEROQUEL) 25 MG tablet Take 25 mg by mouth at bedtime.    . traMADol (ULTRAM) 50 MG tablet Take 1 tablet (50 mg total) by mouth every 12 (twelve) hours as needed for moderate pain. (Patient taking differently: Take 50 mg by mouth daily as needed for moderate pain. ) 10 tablet 0  . acetaminophen (TYLENOL) 500 MG tablet Take 1,000 mg by mouth every 6 (six) hours as needed for mild pain.    Marland Kitchen docusate sodium (COLACE) 100 MG capsule Take 1 capsule (100 mg total) by mouth 2 (two) times daily. (Patient not taking: Reported on 01/08/2020) 10 capsule 0  . enoxaparin (LOVENOX) 40 MG/0.4ML injection Inject 0.4 mLs (40 mg total) into the skin daily for 20 days. (Patient not taking: Reported on 01/08/2020) 8 mL 0  . feeding supplement, ENSURE ENLIVE, (ENSURE ENLIVE) LIQD Take 237 mLs by mouth 2 (two) times daily between meals. (Patient not taking: Reported on 01/08/2020) 237 mL 12  . ferrous sulfate 325 (65 FE) MG tablet Take 1 tablet (325 mg total) by mouth 3 (three) times daily after meals. (Patient not taking: Reported on 01/08/2020) 90 tablet 3  . HYDROcodone-acetaminophen (NORCO/VICODIN) 5-325 MG tablet Take 1-2 tablets by mouth every 6 (six) hours as needed for severe pain (pain score 4-6). (Patient not taking: Reported on 01/08/2020) 15 tablet 0   Past Medical History:  Diagnosis Date  . At high risk for falls   . Blind   . Breast cancer ([place])   . Cancer ([place])   . Sherran Needs syndrome 2017  . GERD (gastroesophageal reflux disease)   . Hearing impaired   . Macular degeneration   . Malaria   . Osteoporosis    Past Surgical History:  Procedure Laterality Date  . BREAST LUMPECTOMY    . BREAST SURGERY    . INTRAMEDULLARY (IM) NAIL INTERTROCHANTERIC Right 04/30/2019   Procedure: INTRAMEDULLARY (IM) NAIL INTERTROCHANTRIC;  Surgeon: Thornton Park, MD;  Location: [facility] ORS;  Service: Orthopedics;  Laterality: Right;   Social  History Social History   Tobacco Use  . Smoking status: Never Smoker  . Smokeless tobacco: Current User    Types: Snuff  Substance Use Topics  . Alcohol use: No  . Drug use: No   Family History History reviewed. No pertinent family history.  Unable to obtain due to patient's dementia and confusion.  Allergies  Allergen Reactions  . Codeine Anaphylaxis and Other (See Comments)  . Penicillins Anaphylaxis and Nausea And Vomiting    Other reaction(s): Unknown  . Aspirin  Nausea And Vomiting and Other (See Comments)    unknown  . Ativan [Lorazepam] Other (See Comments)    Agitation  . Codeine Sulfate Nausea And Vomiting  . Flu Virus Vaccine     Other reaction(s): Unknown  . Other Other (See Comments)  . Sulfa Antibiotics Other (See Comments) and Nausea And Vomiting    unknown  . Tetanus-Diphth-Acell Pertussis     Other reaction(s): Unknown   REVIEW OF SYSTEMS (Negative unless checked)  Constitutional: [] Weight loss  [] Fever  [] Chills Cardiac: [] Chest pain   [] Chest pressure   [] Palpitations   [] Shortness of breath when laying flat   [] Shortness of breath at rest   [] Shortness of breath with exertion. Vascular:  [] Pain in legs with walking   [] Pain in legs at rest   [] Pain in legs when laying flat   [] Claudication   [] Pain in feet when walking  [] Pain in feet at rest  [] Pain in feet when laying flat   [] History of DVT   [] Phlebitis   [] Swelling in legs   [] Varicose veins   [] Non-healing ulcers Pulmonary:   [] Uses home oxygen   [] Productive cough   [] Hemoptysis   [] Wheeze  [] COPD   [] Asthma Neurologic:  [] Dizziness  [] Blackouts   [] Seizures   [] History of stroke   [] History of TIA  [] Aphasia   [] Temporary blindness   [] Dysphagia   [] Weakness or numbness in arms   [] Weakness or numbness in legs Musculoskeletal:  [] Arthritis   [] Joint swelling   [] Joint pain   [] Low back pain Hematologic:  [] Easy bruising  [] Easy bleeding   [] Hypercoagulable state   [x] Anemic   [] Hepatitis Gastrointestinal:  [] Blood in stool   [] Vomiting blood  [] Gastroesophageal reflux/heartburn   [] Difficulty swallowing. Genitourinary:  [] Chronic kidney disease   [] Difficult urination  [] Frequent urination  [] Burning with urination   [] Blood in urine Skin:  [] Rashes   [x] Ulcers   [x] Wounds Psychological:  [] History of anxiety   []  History of major depression.  Physical Examination  Vitals:   01/08/20 0800 01/08/20 0830 01/08/20 0900 01/08/20 0930  BP: (!) 135/48 (!) 133/51 (!) 156/62 (!) 144/56  Pulse: 88 89 89 90  Resp: 13 12 14 14   Temp:      TempSrc:      SpO2: 100% 100% 100% 100%  Weight:      Height:       Body mass index is 18.67 kg/m. Gen: Patient has a history of dementia.  On examination she was only oriented to self. Head: [place]/AT, No temporalis wasting. Prominent temp pulse not noted. Ear/Nose/Throat: Hearing grossly intact, nares w/o erythema or drainage, oropharynx w/o Erythema/Exudate Eyes: Sclera non-icteric, conjunctiva clear Neck: Trachea midline.  No JVD.  Pulmonary:  Good air movement, respirations not labored, equal bilaterally.  Cardiac: RRR, normal S1, S2. Vascular:  Vessel Right Left  Radial Palpable Palpable  Ulnar Palpable Palpable  Brachial Palpable Palpable  Carotid Palpable, without bruit Palpable, without bruit  Aorta Not palpable N/A  Femoral Palpable Palpable  Popliteal Palpable Palpable  PT Non-Palpable Non-Palpable  DP Non-Palpable Non-Palpable   Right lower extremity: Thigh soft.  Calf soft.  Extremities warm distally to toes.  Large ulcer with no granulation tissue noted.  100% fibrinous necrotic tissue noted.  Foul odor.  Unable to palpate pedal pulses.  Surrounding erythema.    Photos    01/08/2020 06:43  Attached To:  Hospital Encounter on 01/08/20  [place], Lamont, MD  [facility]Emergency Department   Gastrointestinal: soft, non-tender/non-distended.  No guarding/reflex.  Musculoskeletal: M/S 5/5  throughout.  Extremities without ischemic changes.  No deformity or atrophy. Minimal edema. Neurologic: Sensation grossly intact in extremities.  Symmetrical.  Speech is fluent. Motor exam as listed above. Psychiatric: Patient with a history of dementia.  Alert only to self during this examination. Dermatologic: As above Lymph : No Cervical, Axillary, or Inguinal lymphadenopathy.  CBC Lab Results  Component Value Date   WBC 8.7 01/08/2020   HGB 10.1 (L) 01/08/2020   HCT 30.4 (L) 01/08/2020   MCV 88.9 01/08/2020   PLT 368 01/08/2020   BMET    Component Value Date/Time   NA 128 (L) 01/08/2020 0545   NA 136 02/10/2015 1103   K 3.8 01/08/2020 0545   K 4.5 02/10/2015 1103   CL 94 (L) 01/08/2020 0545   CL 103 02/10/2015 1103   CO2 21 (L) 01/08/2020 0545   CO2 28 02/10/2015 1103   GLUCOSE 154 (H) 01/08/2020 0545   GLUCOSE 101 (H) 02/10/2015 1103   BUN 34 (H) 01/08/2020 0545   BUN 13 02/10/2015 1103   CREATININE 1.21 (H) 01/08/2020 0545   CREATININE 0.99 02/10/2015 1103   CALCIUM 8.9 01/08/2020 0545   CALCIUM 8.7 (L) 02/10/2015 1103   GFRNONAA 38 (L) 01/08/2020 0545   GFRNONAA 51 (L) 02/10/2015 1103   GFRAA 44 (L) 01/08/2020 0545   GFRAA 59 (L) 02/10/2015 1103   Estimated Creatinine Clearance: 21.5 mL/min (A) (by C-G formula based on SCr of 1.21 mg/dL (H)).  COAG Lab Results  Component Value Date   INR 1.2 01/08/2020   INR 1.1 04/30/2019   Radiology DG Foot Complete Right  Result Date: 01/08/2020 CLINICAL DATA:  Infected wound on top of right foot EXAM: RIGHT FOOT COMPLETE - 3+ VIEW COMPARISON:  None. FINDINGS: Soft tissue swelling with small bubbles of superficial gas along the dorsal forefoot. No underlying erosion or opaque foreign body. Osteopenia. No fracture or malalignment. IMPRESSION: Swelling and 2 bubbles of superficial gas at the dorsal forefoot. No opaque foreign body or acute osseous finding. Electronically Signed   By: Monte Fantasia M.D.   On: 01/08/2020  06:00   Assessment/Plan The patient is a 86 year old female with multiple medical issues including dementia who presented to the [facility] emergency department with a chronic right foot wound  1.  Chronic right foot wound:  As per daughter, the patient sustained was diagnosed as a second-degree burn via telemedicine appointment approximately 2 weeks ago.  The patient was instructed to wrap the wound and was given a course of doxycycline.  The had become progressively weaker and started to experience dizziness which prompted her to seek medical attention in our emergency department.  The patient was found to have a necrotic infected foul-smelling wound to the right dorsum of the foot.  In the setting of a chronic wound, unable to palpate pedal pulses recommend undergoing a right lower extremity angiogram with possible intervention and attempt assess the patient's anatomy and contributing degree of peripheral artery disease.  If appropriate, an attempt can be made to revascularize the leg at that time.  Procedure, risks and benefits were explained to the patient's daughter.  Consent was obtained.  2.  Malnutrition: Recommend consulting nutrition as this will certainly impact the patient's ability to heal her wound even if arterial patency is reestablished and she undergoes debridement.  3. Dehydration: Most likely related to poor p.o. intake as the patient is also malnourished. Currently receiving IV fluids. Monitor labs.  Discussed with Dr. Francene Castle, PA-C  01/08/2020 11:12 AM  This note was created with Dragon medical transcription system.  Any error is purely unintentional

## 2020-01-08 NOTE — Consult Note (Addendum)
Pharmacy Antibiotic Note  Angel Brandt is a 86 y.o. female admitted on 01/08/2020 with wound infection. According to the daughter, patient dropped hot coffee onto her right foot 2 weeks ago  Pharmacy has been consulted for Cefepime/Vancomycin dosing.  PCN allergy, but patient had Cefepime at ~0600. Pharmacy confirmed with ED nurse no reactions.   Baseline Scr (mg/dL): 0.91 (05/01/2019)   Plan: 1. Cefepime 2g Q24h - next dose will start tomorrow morning given prior dose in the ED this morning.   2. Vancomycin 1000 mg x1 dose given in the ED. Will order vancomycin based on levels given renal function. Vancomycin random ordered for tomorrow and Scr level as well.    Height: 5\' 3"  (160 cm) Weight: 105 lb 6.1 oz (47.8 kg) IBW/kg (Calculated) : 52.4  Temp (24hrs), Avg:98.1 F (36.7 C), Min:98.1 F (36.7 C), Max:98.1 F (36.7 C)  Recent Labs  Lab 01/08/20 0545 01/08/20 0749  WBC 8.7  --   CREATININE 1.21*  --   LATICACIDVEN 4.2* 2.8*    Estimated Creatinine Clearance: 21.5 mL/min (A) (by C-G formula based on SCr of 1.21 mg/dL (H)).    Allergies  Allergen Reactions  . Codeine Anaphylaxis and Other (See Comments)  . Penicillins Anaphylaxis and Nausea And Vomiting    Other reaction(s): Unknown  . Aspirin Nausea And Vomiting and Other (See Comments)    unknown  . Ativan [Lorazepam] Other (See Comments)    Agitation  . Codeine Sulfate Nausea And Vomiting  . Flu Virus Vaccine     Other reaction(s): Unknown  . Other Other (See Comments)  . Sulfa Antibiotics Other (See Comments) and Nausea And Vomiting    unknown  . Tetanus-Diphth-Acell Pertussis     Other reaction(s): Unknown    Thank you for allowing pharmacy to be a part of this patient's care.  Rowland Lathe 01/08/2020 8:54 AM

## 2020-01-08 NOTE — H&P (Signed)
History and Physical    Avree Heinert Cossin [id number] DOB: [birth year] DOA: 01/08/2020  PCP: Maryland Pink, MD   Patient coming from: Home  I have personally briefly reviewed patient's old medical records in [place]  Chief Complaint: Weakness  HPI: Angel Brandt is a 86 y.o. female with medical history significant for macular degeneration, she is legally blind, history of breast CA, dementia, TIA and hearing impairment who was brought in to the ER for evaluation of weakness and dizziness. Per her daughter she woke up on the morning of her admission and complained of feeling dizzy like she would pass out. EMS was called and they noted a foul smell coming from her right foot. Per patient's daughter she spilled coffee on her right foot about 2 weeks ago and they had a virtual visit with her primary care provider who recommended local wound care.  He had recommended a 10-day course of oral doxycycline which patient has almost completed.  Her daughter had noticed worsening of the wound with increasing redness and blister formation  But states that her mother refused to come to the hospital.  I am unable to do a review of systems due to the patient's history of dementia  ED Course: 86 y.o. female with a history of dementia, macular degeneration and legally blind, breast cancer, TIA, hearing impaired who presents from home for dizziness. Patient arrives with R foot wrapped from a 2nd degree burn injury sustained 2 weeks ago. Very strong foul fruity odor concerning for pseudomonas infection. Please refer to picture and exam above for details on the wound.  Review of Systems: As per HPI otherwise 10 point review of systems negative.    Past Medical History:  Diagnosis Date  . At high risk for falls   . Blind   . Breast cancer ([place])   . Cancer ([place])   . Sherran Needs syndrome 2017  . GERD (gastroesophageal reflux disease)   . Hearing impaired   . Macular degeneration   . Malaria   .  Osteoporosis     Past Surgical History:  Procedure Laterality Date  . BREAST LUMPECTOMY    . BREAST SURGERY    . INTRAMEDULLARY (IM) NAIL INTERTROCHANTERIC Right 04/30/2019   Procedure: INTRAMEDULLARY (IM) NAIL INTERTROCHANTRIC;  Surgeon: Thornton Park, MD;  Location: [facility] ORS;  Service: Orthopedics;  Laterality: Right;     reports that she has never smoked. Her smokeless tobacco use includes snuff. She reports that she does not drink alcohol or use drugs.  Allergies  Allergen Reactions  . Codeine Anaphylaxis and Other (See Comments)  . Penicillins Anaphylaxis and Nausea And Vomiting    Other reaction(s): Unknown  . Aspirin Nausea And Vomiting and Other (See Comments)    unknown  . Ativan [Lorazepam] Other (See Comments)    Agitation  . Codeine Sulfate Nausea And Vomiting  . Flu Virus Vaccine     Other reaction(s): Unknown  . Other Other (See Comments)  . Sulfa Antibiotics Other (See Comments) and Nausea And Vomiting    unknown  . Tetanus-Diphth-Acell Pertussis     Other reaction(s): Unknown    History reviewed. No pertinent family history.   Prior to Admission medications   Medication Sig Start Date End Date Taking? Authorizing Provider  aspirin EC 81 MG EC tablet Take 1 tablet (81 mg total) by mouth daily. 10/20/17  Yes Wieting, Richard, MD  mirtazapine (REMERON) 45 MG tablet Take 45 mg by mouth at bedtime.  05/27/15  Yes  [provider]  Multiple Vitamin (MULTIVITAMIN WITH MINERALS) TABS tablet Take 1 tablet by mouth daily. 05/04/19  Yes Vaughan Basta, MD  omeprazole (PRILOSEC) 20 MG capsule Take 20 mg by mouth 2 (two) times daily before a meal.  12/23/15  Yes [provider]  polyethylene glycol (MIRALAX / GLYCOLAX) 17 g packet Take 17 g by mouth daily. Patient taking differently: Take 17 g by mouth daily as needed for mild constipation.  05/04/19  Yes Vaughan Basta, MD  QUEtiapine (SEROQUEL) 25 MG tablet Take 25 mg by mouth at bedtime.    Yes [provider]  traMADol (ULTRAM) 50 MG tablet Take 1 tablet (50 mg total) by mouth every 12 (twelve) hours as needed for moderate pain. Patient taking differently: Take 50 mg by mouth daily as needed for moderate pain.  05/03/19  Yes Vaughan Basta, MD  acetaminophen (TYLENOL) 500 MG tablet Take 1,000 mg by mouth every 6 (six) hours as needed for mild pain.    [provider]  docusate sodium (COLACE) 100 MG capsule Take 1 capsule (100 mg total) by mouth 2 (two) times daily. Patient not taking: Reported on 01/08/2020 05/03/19   Vaughan Basta, MD  enoxaparin (LOVENOX) 40 MG/0.4ML injection Inject 0.4 mLs (40 mg total) into the skin daily for 20 days. Patient not taking: Reported on 01/08/2020 05/04/19 05/24/19  Vaughan Basta, MD  feeding supplement, ENSURE ENLIVE, (ENSURE ENLIVE) LIQD Take 237 mLs by mouth 2 (two) times daily between meals. Patient not taking: Reported on 01/08/2020 05/03/19   Vaughan Basta, MD  ferrous sulfate 325 (65 FE) MG tablet Take 1 tablet (325 mg total) by mouth 3 (three) times daily after meals. Patient not taking: Reported on 01/08/2020 05/03/19   Vaughan Basta, MD  HYDROcodone-acetaminophen (NORCO/VICODIN) 5-325 MG tablet Take 1-2 tablets by mouth every 6 (six) hours as needed for severe pain (pain score 4-6). Patient not taking: Reported on 01/08/2020 05/03/19   Vaughan Basta, MD    Physical Exam: Vitals:   01/08/20 0800 01/08/20 0830 01/08/20 0900 01/08/20 0930  BP: (!) 135/48 (!) 133/51 (!) 156/62 (!) 144/56  Pulse: 88 89 89 90  Resp: 13 12 14 14   Temp:      TempSrc:      SpO2: 100% 100% 100% 100%  Weight:      Height:         Vitals:   01/08/20 0800 01/08/20 0830 01/08/20 0900 01/08/20 0930  BP: (!) 135/48 (!) 133/51 (!) 156/62 (!) 144/56  Pulse: 88 89 89 90  Resp: 13 12 14 14   Temp:      TempSrc:      SpO2: 100% 100% 100% 100%  Weight:      Height:        Constitutional: NAD,  alert and oriented only to person, Frail elderly patient Eyes: PERRL, lids and conjunctivae normal ENMT: Mucous membranes are moist.  Neck: normal, supple, no masses, no thyromegaly Respiratory: clear to auscultation bilaterally, no wheezing, no crackles. Normal respiratory effort. No accessory muscle use.  Cardiovascular: Regular rate and rhythm, no murmurs / rubs / gallops. No extremity edema. 2+ pedal pulses. No carotid bruits.  Abdomen: no tenderness, no masses palpated. No hepatosplenomegaly. Bowel sounds positive.  Musculoskeletal: no clubbing / cyanosis. No joint deformity upper and lower extremities.  Skin: no rashes, lesions, redness and swelling involving right leg with foul smelling ulcer with necrosis over dorsum of the right foot. Trace swelling involving the left foot Neurologic: Weakness Psychiatric: Normal  mood and affect.   Labs on Admission: I have personally reviewed following labs and imaging studies  CBC: Recent Labs  Lab 01/08/20 0545  WBC 8.7  NEUTROABS 6.5  HGB 10.1*  HCT 30.4*  MCV 88.9  PLT 123XX123   Basic Metabolic Panel: Recent Labs  Lab 01/08/20 0545  NA 128*  K 3.8  CL 94*  CO2 21*  GLUCOSE 154*  BUN 34*  CREATININE 1.21*  CALCIUM 8.9   GFR: Estimated Creatinine Clearance: 21.5 mL/min (A) (by C-G formula based on SCr of 1.21 mg/dL (H)). Liver Function Tests: Recent Labs  Lab 01/08/20 0545  AST 24  ALT 14  ALKPHOS 122  BILITOT 0.7  PROT 6.9  ALBUMIN 3.2*   No results for input(s): LIPASE, AMYLASE in the last 168 hours. No results for input(s): AMMONIA in the last 168 hours. Coagulation Profile: Recent Labs  Lab 01/08/20 0546  INR 1.2   Cardiac Enzymes: No results for input(s): CKTOTAL, CKMB, CKMBINDEX, TROPONINI in the last 168 hours. BNP (last 3 results) No results for input(s): PROBNP in the last 8760 hours. HbA1C: No results for input(s): HGBA1C in the last 72 hours. CBG: No results for input(s): GLUCAP in the last 168  hours. Lipid Profile: No results for input(s): CHOL, HDL, LDLCALC, TRIG, CHOLHDL, LDLDIRECT in the last 72 hours. Thyroid Function Tests: No results for input(s): TSH, T4TOTAL, FREET4, T3FREE, THYROIDAB in the last 72 hours. Anemia Panel: No results for input(s): VITAMINB12, FOLATE, FERRITIN, TIBC, IRON, RETICCTPCT in the last 72 hours. Urine analysis:    Component Value Date/Time   COLORURINE STRAW (A) 04/29/2019 2204   APPEARANCEUR CLEAR (A) 04/29/2019 2204   APPEARANCEUR Clear 08/21/2014 1938   LABSPEC 1.004 (L) 04/29/2019 2204   LABSPEC 1.006 08/21/2014 1938   PHURINE 7.0 04/29/2019 2204   GLUCOSEU NEGATIVE 04/29/2019 2204   GLUCOSEU Negative 08/21/2014 1938   HGBUR SMALL (A) 04/29/2019 2204   BILIRUBINUR NEGATIVE 04/29/2019 2204   BILIRUBINUR Negative 08/21/2014 1938   KETONESUR NEGATIVE 04/29/2019 2204   PROTEINUR NEGATIVE 04/29/2019 2204   NITRITE NEGATIVE 04/29/2019 2204   LEUKOCYTESUR NEGATIVE 04/29/2019 2204   LEUKOCYTESUR Trace 08/21/2014 1938    Radiological Exams on Admission: DG Foot Complete Right  Result Date: 01/08/2020 CLINICAL DATA:  Infected wound on top of right foot EXAM: RIGHT FOOT COMPLETE - 3+ VIEW COMPARISON:  None. FINDINGS: Soft tissue swelling with small bubbles of superficial gas along the dorsal forefoot. No underlying erosion or opaque foreign body. Osteopenia. No fracture or malalignment. IMPRESSION: Swelling and 2 bubbles of superficial gas at the dorsal forefoot. No opaque foreign body or acute osseous finding. Electronically Signed   By: Monte Fantasia M.D.   On: 01/08/2020 06:00    EKG: Independently reviewed.  Sinus Tachycardia LVH  Assessment/Plan Principal Problem:   Second degree burn of foot, right, sequela Active Problems:   Protein-calorie malnutrition, severe   Dehydration   Hyponatremia   Anemia of chronic disease    Second-degree burn of right foot with superimposed infection and cellulitis and cellulitis and  Cellulitis Patient presents to the emergency room after sustaining a second-degree burn on her right foot which now appears infected She has an ulcer over the dorsum of the right foot with redness extending to the right leg  Right foot x-ray shows swelling and 2 bubbles of superficial gas at the dorsal forefoot Will start patient empirically on IV antibiotic therapy with vancomycin and cefepime Will consult podiatry and vascular surgery   Dehydration :  Due to poor oral intake Will hydrate patient   Hyponatremia Most likely related to poor oral intake Will hydrate patient and repeat sodium levels in a.m.   Anemia of chronic disease H&H is stable will monitor closely during this hospitalization    Protein calorie malnutrition, Severe Continue nutritional supplements Dietary consult   Dementia Patient will require assistance with activities of daily living  DVT prophylaxis: Heparin Code Status: DNR Family Communication: Plan of care was discussed with patient's daughter, Horton Chin over the phone.  All questions and concerns have been addressed Disposition Plan: Back to previous home environment Consults called: Podiatry, Vascular surgery    Jovonni Borquez MD [facility]     01/08/2020, 10:07 AM

## 2020-01-08 NOTE — ED Notes (Signed)
1st set of blood cultures drawn by this RN and labeled by Matilde Haymaker RN.  2nd set of cultures-IV was accessed by Terri Piedra RN, however, the actual blood cultures were collected by Lovina Reach RN.

## 2020-01-09 ENCOUNTER — Encounter: Payer: Self-pay | Admitting: Cardiology

## 2020-01-09 ENCOUNTER — Inpatient Hospital Stay: Payer: Medicare Other | Admitting: Certified Registered"

## 2020-01-09 ENCOUNTER — Encounter
Admission: EM | Disposition: A | Discharge status: Hospice | Payer: Self-pay | Source: Home / Self Care | Attending: Internal Medicine

## 2020-01-09 DIAGNOSIS — Z7189 Other specified counseling: Secondary | ICD-10-CM

## 2020-01-09 DIAGNOSIS — T148XXA Other injury of unspecified body region, initial encounter: Secondary | ICD-10-CM

## 2020-01-09 DIAGNOSIS — L089 Local infection of the skin and subcutaneous tissue, unspecified: Secondary | ICD-10-CM

## 2020-01-09 DIAGNOSIS — E860 Dehydration: Secondary | ICD-10-CM

## 2020-01-09 DIAGNOSIS — F0390 Unspecified dementia without behavioral disturbance: Secondary | ICD-10-CM

## 2020-01-09 DIAGNOSIS — Z515 Encounter for palliative care: Secondary | ICD-10-CM

## 2020-01-09 HISTORY — PX: IRRIGATION AND DEBRIDEMENT FOOT: SHX6602

## 2020-01-09 LAB — CREATININE, SERUM
Creatinine, Ser: 0.8 mg/dL (ref 0.44–1.00)
GFR calc Af Amer: >60 mL/min (ref >60)
GFR calc non Af Amer: >60 mL/min (ref >60)

## 2020-01-09 LAB — VANCOMYCIN, RANDOM: Vancomycin Rm: 6

## 2020-01-09 SURGERY — IRRIGATION AND DEBRIDEMENT FOOT
Anesthesia: General | Site: Foot | Laterality: Right

## 2020-01-09 MED ORDER — LIDOCAINE HCL (CARDIAC) PF 100 MG/5ML IV SOSY
PREFILLED_SYRINGE | INTRAVENOUS | Status: DC | PRN
  Administered 2020-01-09: 40 mg via INTRAVENOUS

## 2020-01-09 MED ORDER — SODIUM CHLORIDE 0.9 % IV SOLN
2.0000 g | Freq: Two times a day (BID) | INTRAVENOUS | Status: DC
  Administered 2020-01-09 – 2020-01-13 (×8): 2 g via INTRAVENOUS
  Filled 2020-01-09 (×11): qty 2

## 2020-01-09 MED ORDER — METRONIDAZOLE IN NACL 5-0.79 MG/ML-% IV SOLN
500.0000 mg | Freq: Three times a day (TID) | INTRAVENOUS | Status: DC
  Administered 2020-01-09 – 2020-01-13 (×12): 500 mg via INTRAVENOUS
  Filled 2020-01-09 (×18): qty 100

## 2020-01-09 MED ORDER — ENOXAPARIN SODIUM 40 MG/0.4ML ~~LOC~~ SOLN
40.0000 mg | SUBCUTANEOUS | Status: DC
  Administered 2020-01-09 – 2020-01-12 (×3): 40 mg via SUBCUTANEOUS
  Filled 2020-01-09 (×4): qty 0.4

## 2020-01-09 MED ORDER — PHENYLEPHRINE HCL (PRESSORS) 10 MG/ML IV SOLN
INTRAVENOUS | Status: DC | PRN
  Administered 2020-01-09 (×3): 100 ug via INTRAVENOUS

## 2020-01-09 MED ORDER — DEXMEDETOMIDINE HCL 200 MCG/2ML IV SOLN
INTRAVENOUS | Status: DC | PRN
  Administered 2020-01-09: 8 ug via INTRAVENOUS

## 2020-01-09 MED ORDER — EPHEDRINE 5 MG/ML INJ
INTRAVENOUS | Status: AC
  Filled 2020-01-09: qty 10

## 2020-01-09 MED ORDER — FENTANYL CITRATE (PF) 100 MCG/2ML IJ SOLN
INTRAMUSCULAR | Status: AC
  Filled 2020-01-09: qty 2

## 2020-01-09 MED ORDER — FENTANYL CITRATE (PF) 100 MCG/2ML IJ SOLN
INTRAMUSCULAR | Status: DC | PRN
  Administered 2020-01-09 (×4): 25 ug via INTRAVENOUS

## 2020-01-09 MED ORDER — DEXAMETHASONE SODIUM PHOSPHATE 10 MG/ML IJ SOLN
INTRAMUSCULAR | Status: DC | PRN
  Administered 2020-01-09: 10 mg via INTRAVENOUS

## 2020-01-09 MED ORDER — EPHEDRINE SULFATE 50 MG/ML IJ SOLN
INTRAMUSCULAR | Status: DC | PRN
  Administered 2020-01-09 (×4): 10 mg via INTRAVENOUS

## 2020-01-09 MED ORDER — GENTAMICIN SULFATE 40 MG/ML IJ SOLN
INTRAMUSCULAR | Status: AC
  Filled 2020-01-09: qty 6

## 2020-01-09 MED ORDER — ACETAMINOPHEN 10 MG/ML IV SOLN
INTRAVENOUS | Status: AC
  Filled 2020-01-09: qty 100

## 2020-01-09 MED ORDER — FENTANYL CITRATE (PF) 100 MCG/2ML IJ SOLN
25.0000 ug | INTRAMUSCULAR | Status: DC | PRN
  Administered 2020-01-09: 25 ug via INTRAVENOUS

## 2020-01-09 MED ORDER — ACETAMINOPHEN 10 MG/ML IV SOLN
INTRAVENOUS | Status: DC | PRN
  Administered 2020-01-09: 500 mg via INTRAVENOUS

## 2020-01-09 MED ORDER — NEOMYCIN-POLYMYXIN B GU 40-200000 IR SOLN
Status: AC
  Filled 2020-01-09: qty 2

## 2020-01-09 MED ORDER — ONDANSETRON HCL 4 MG/2ML IJ SOLN
INTRAMUSCULAR | Status: DC | PRN
  Administered 2020-01-09: 4 mg via INTRAVENOUS

## 2020-01-09 MED ORDER — FENTANYL CITRATE (PF) 100 MCG/2ML IJ SOLN
INTRAMUSCULAR | Status: AC
  Administered 2020-01-09: 25 ug via INTRAVENOUS
  Filled 2020-01-09: qty 2

## 2020-01-09 MED ORDER — LACTATED RINGERS IV SOLN: INTRAVENOUS | Status: DC

## 2020-01-09 MED ORDER — PROPOFOL 10 MG/ML IV BOLUS
INTRAVENOUS | Status: AC
  Filled 2020-01-09: qty 20

## 2020-01-09 MED ORDER — PROPOFOL 10 MG/ML IV BOLUS
INTRAVENOUS | Status: DC | PRN
  Administered 2020-01-09: 80 mg via INTRAVENOUS
  Administered 2020-01-09: 30 mg via INTRAVENOUS

## 2020-01-09 MED ORDER — VANCOMYCIN HCL 1000 MG IV SOLR
INTRAVENOUS | Status: AC
  Filled 2020-01-09: qty 1000

## 2020-01-09 MED ORDER — VANCOMYCIN HCL 750 MG/150ML IV SOLN
750.0000 mg | INTRAVENOUS | Status: DC
  Administered 2020-01-09 – 2020-01-10 (×2): 750 mg via INTRAVENOUS
  Filled 2020-01-09 (×2): qty 150

## 2020-01-09 MED ORDER — SODIUM CHLORIDE 0.9 % IR SOLN
Status: DC | PRN
  Administered 2020-01-09: 500 mL

## 2020-01-09 MED ORDER — BUPIVACAINE HCL (PF) 0.5 % IJ SOLN
INTRAMUSCULAR | Status: AC
  Filled 2020-01-09: qty 30

## 2020-01-09 SURGICAL SUPPLY — 56 items
"PENCIL ELECTRO HAND CTR " (MISCELLANEOUS) ×1 IMPLANT
BLADE OSCILLATING/SAGITTAL (BLADE)
BLADE SURG 15 STRL LF DISP TIS (BLADE) ×2 IMPLANT
BLADE SURG 15 STRL SS (BLADE) ×2
BLADE SW THK.38XMED LNG THN (BLADE) IMPLANT
BNDG CONFORM 2 STRL LF (GAUZE/BANDAGES/DRESSINGS) ×4 IMPLANT
BNDG ELASTIC 4X5.8 VLCR NS LF (GAUZE/BANDAGES/DRESSINGS) ×1 IMPLANT
BNDG ESMARK 4X12 TAN STRL LF (GAUZE/BANDAGES/DRESSINGS) ×4 IMPLANT
BNDG GAUZE 4.5X4.1 6PLY STRL (MISCELLANEOUS) ×7 IMPLANT
CANISTER SUCT 1200ML W/VALVE (MISCELLANEOUS) ×4 IMPLANT
COVER WAND RF STERILE (DRAPES) ×4 IMPLANT
CUFF TOURN SGL QUICK 12 (TOURNIQUET CUFF) IMPLANT
CUFF TOURN SGL QUICK 18X4 (TOURNIQUET CUFF) ×3 IMPLANT
DRAPE FLUOR MINI C-ARM 54X84 (DRAPES) IMPLANT
DRSG MEPITEL 4X7.2 (GAUZE/BANDAGES/DRESSINGS) ×3 IMPLANT
DURAPREP 26ML APPLICATOR (WOUND CARE) ×1 IMPLANT
ELECT REM PT RETURN 9FT ADLT (ELECTROSURGICAL) ×4
ELECTRODE REM PT RTRN 9FT ADLT (ELECTROSURGICAL) ×2 IMPLANT
GAUZE 4X4 16PLY RFD (DISPOSABLE) ×3 IMPLANT
GAUZE SPONGE 4X4 12PLY STRL (GAUZE/BANDAGES/DRESSINGS) ×4 IMPLANT
GAUZE XEROFORM 1X8 LF (GAUZE/BANDAGES/DRESSINGS) ×4 IMPLANT
GLOVE BIO SURGEON STRL SZ7.5 (GLOVE) ×4 IMPLANT
GLOVE INDICATOR 8.0 STRL GRN (GLOVE) ×4 IMPLANT
GOWN STRL REUS W/ TWL LRG LVL3 (GOWN DISPOSABLE) ×4 IMPLANT
GOWN STRL REUS W/TWL LRG LVL3 (GOWN DISPOSABLE) ×4
HANDPIECE VERSAJET DEBRIDEMENT (MISCELLANEOUS) ×7 IMPLANT
KIT STIMULAN RAPID CURE 5CC (Orthopedic Implant) ×3 IMPLANT
KIT TURNOVER KIT A (KITS) ×4 IMPLANT
LABEL OR SOLS (LABEL) ×4 IMPLANT
NDL FILTER BLUNT 18X1 1/2 (NEEDLE) ×1 IMPLANT
NDL HYPO 25X1 1.5 SAFETY (NEEDLE) ×3 IMPLANT
NEEDLE FILTER BLUNT 18X 1/2SAF (NEEDLE) ×4
NEEDLE FILTER BLUNT 18X1 1/2 (NEEDLE) ×4 IMPLANT
NEEDLE HYPO 25X1 1.5 SAFETY (NEEDLE) ×12 IMPLANT
NS IRRIG 500ML POUR BTL (IV SOLUTION) ×4 IMPLANT
PACK EXTREMITY ARMC (MISCELLANEOUS) ×4 IMPLANT
PAD ABD DERMACEA PRESS 5X9 (GAUZE/BANDAGES/DRESSINGS) ×14 IMPLANT
PENCIL ELECTRO HAND CTR (MISCELLANEOUS) ×4 IMPLANT
RASP SM TEAR CROSS CUT (RASP) IMPLANT
SOL PREP PVP 2OZ (MISCELLANEOUS) ×8
SOLUTION PREP PVP 2OZ (MISCELLANEOUS) ×3 IMPLANT
STAPLER SKIN PROX 35W (STAPLE) ×3 IMPLANT
STOCKINETTE 48X4 2 PLY STRL (GAUZE/BANDAGES/DRESSINGS) ×1 IMPLANT
STOCKINETTE STRL 4IN 9604848 (GAUZE/BANDAGES/DRESSINGS) ×4 IMPLANT
STOCKINETTE STRL 6IN 960660 (GAUZE/BANDAGES/DRESSINGS) ×1 IMPLANT
SUT ETHILON 3-0 FS-10 30 BLK (SUTURE) ×4
SUT ETHILON 4-0 (SUTURE) ×2
SUT ETHILON 4-0 FS2 18XMFL BLK (SUTURE) ×2
SUT VIC AB 3-0 SH 27 (SUTURE) ×2
SUT VIC AB 3-0 SH 27X BRD (SUTURE) ×2 IMPLANT
SUT VIC AB 4-0 FS2 27 (SUTURE) ×4 IMPLANT
SUTURE EHLN 3-0 FS-10 30 BLK (SUTURE) ×2 IMPLANT
SUTURE ETHLN 4-0 FS2 18XMF BLK (SUTURE) ×2 IMPLANT
SWAB CULTURE AMIES ANAERIB BLU (MISCELLANEOUS) ×3 IMPLANT
SYR 10ML LL (SYRINGE) ×11 IMPLANT
SYR 3ML LL SCALE MARK (SYRINGE) ×4 IMPLANT

## 2020-01-09 NOTE — NC FL2 (Signed)
[place] LEVEL OF CARE SCREENING TOOL     IDENTIFICATION  Patient Name: Angel Brandt Birthdate: 10/31/1923 Sex: female Admission Date (Current Location): 01/08/2020  [place] and [place] Number:  Engineering geologist and Address:  [facility], [address] [place] [employer], [place] [address]      Provider Number: [id number]  Attending Physician Name and Address:  Max Sane, MD  Relative Name and Phone Number:  [name] [phone]    Current Level of Care: Hospital Recommended Level of Care: [place] Prior Approval Number:    Date Approved/Denied:   PASRR Number: [id number]  Discharge Plan: SNF    Current Diagnoses: Patient Active Problem List   Diagnosis Date Noted  . Second degree burn of foot, right, sequela 01/08/2020  . Dehydration 01/08/2020  . Hyponatremia 01/08/2020  . Anemia of chronic disease 01/08/2020  . Pressure injury of skin 01/08/2020  . Protein-calorie malnutrition, severe 04/30/2019  . Closed right hip fracture, initial encounter ([place]) 04/29/2019  . TIA (transient ischemic attack) 10/18/2017  . Carcinoma of overlapping sites of right breast in female, estrogen receptor positive ([place]) 02/10/2017  . Diverticulitis 02/10/2016  . H/O malaria 02/10/2016  . Degeneration macular 02/10/2016  . 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 Gait instability 12/04/2014    Orientation RESPIRATION BLADDER Height & Weight     Place  Normal Incontinent Weight: 47.8 kg Height:  5\' 3"  (160 cm)  BEHAVIORAL SYMPTOMS/MOOD NEUROLOGICAL BOWEL NUTRITION STATUS      Incontinent    AMBULATORY STATUS COMMUNICATION OF NEEDS Skin   Extensive Assist Verbally PU Stage and Appropriate Care(pressure injury groin stage 2, pressure injury COxyc stage 2, pressure injury left hiop statge 2, sacrum stage 2)   PU Stage 2 Dressing: Daily                   Personal Care Assistance Level of Assistance  Dressing, Bathing Bathing  Assistance: Limited assistance   Dressing Assistance: Limited assistance     Functional Limitations Info  Sight, Hearing, Speech Sight Info: Impaired Hearing Info: Impaired Speech Info: Adequate    SPECIAL CARE FACTORS FREQUENCY  PT (By licensed PT), OT (By licensed OT)     PT Frequency: 5 times per week OT Frequency: 5 times per week            Contractures Contractures Info: Not present    Additional Factors Info  Code Status, Allergies Code Status Info: DNR Allergies Info: Codeine, Penicillins, Aspirin, Ativan Lorazepam, Codeine Sulfate, Flu Virus Vaccine, Other, Sulfa Antibiotics, Tetanus-diphth-acell Pertussis           Current Medications (01/09/2020):  This is the current hospital active medication list Current Facility-Administered Medications  Medication Dose Route Frequency Provider Last Rate Last Admin  . 0.9 %  sodium chloride infusion   Intravenous Continuous Schnier, Dolores Lory, MD 75 mL/hr at 01/09/20 0456 New Bag at 01/09/20 0456  . 0.9 %  sodium chloride infusion  250 mL Intravenous PRN Schnier, Dolores Lory, MD      . acetaminophen (TYLENOL) tablet 1,000 mg  1,000 mg Oral Q6H PRN Schnier, Dolores Lory, MD      . acetaminophen (TYLENOL) tablet 650 mg  650 mg Oral Q4H PRN Schnier, Dolores Lory, MD      . ceFEPIme (MAXIPIME) 2 g in sodium chloride 0.9 % 100 mL IVPB  2 g Intravenous Q12H Shanlever, Pierce Crane, RPH      . docusate sodium (COLACE) capsule 100 mg  100 mg Oral BID Schnier, Dolores Lory, MD      . enoxaparin (LOVENOX) injection 40 mg  40 mg Subcutaneous Q24H Lu Duffel, [facility]      . feeding supplement (ENSURE ENLIVE) (ENSURE ENLIVE) liquid 237 mL  237 mL Oral BID BM Schnier, Dolores Lory, MD      . ferrous sulfate tablet 325 mg  325 mg Oral TID PC Schnier, Dolores Lory, MD      . HYDROmorphone (DILAUDID) injection 1 mg  1 mg Intravenous Once PRN Stegmayer, Janalyn Harder, PA-C      . mirtazapine (REMERON) tablet 45 mg  45 mg Oral QHS Schnier, Dolores Lory, MD       . morphine 4 MG/ML injection 2 mg  2 mg Intravenous Q1H PRN Schnier, Dolores Lory, MD      . multivitamin with minerals tablet 1 tablet  1 tablet Oral Daily Schnier, Dolores Lory, MD      . ondansetron [facility]) tablet 4 mg  4 mg Oral Q6H PRN Schnier, Dolores Lory, MD       Or  . ondansetron (ZOFRAN) injection 4 mg  4 mg Intravenous Q6H PRN Schnier, Dolores Lory, MD      . ondansetron [facility]) injection 4 mg  4 mg Intravenous Q6H PRN Schnier, Dolores Lory, MD      . ondansetron [facility]) injection 4 mg  4 mg Intravenous Q6H PRN Stegmayer, Kimberly A, PA-C      . pantoprazole (PROTONIX) EC tablet 40 mg  40 mg Oral Daily Schnier, Dolores Lory, MD      . polyethylene glycol (MIRALAX / GLYCOLAX) packet 17 g  17 g Oral Daily Schnier, Dolores Lory, MD      . QUEtiapine (SEROQUEL) tablet 25 mg  25 mg Oral QHS Schnier, Dolores Lory, MD      . sodium chloride flush (NS) 0.9 % injection 3 mL  3 mL Intravenous Q12H Schnier, Dolores Lory, MD      . sodium chloride flush (NS) 0.9 % injection 3 mL  3 mL Intravenous PRN Schnier, Dolores Lory, MD      . vancomycin (VANCOREADY) IVPB 750 mg/150 mL  750 mg Intravenous Q36H Lu Duffel, [facility]         Discharge Medications: Please see discharge summary for a list of discharge medications.  Relevant Imaging Results:  Relevant Lab Results:   Additional Information SSN: [id number]  Su Hilt, RN

## 2020-01-09 NOTE — Progress Notes (Signed)
[place] at [place] NAME: Angel Brandt    MR#:  [id number]  DATE OF BIRTH:  March 24, 1924  SUBJECTIVE:  CHIEF COMPLAINT:   Chief Complaint  Patient presents with  . Weakness  . Wound Check  waiting for I & D today, pleasantly confused, baseline dementia REVIEW OF SYSTEMS:  ROSunable to obtain due to her dementia DRUG ALLERGIES:   Allergies  Allergen Reactions  . Codeine Anaphylaxis and Other (See Comments)  . Penicillins Anaphylaxis and Nausea And Vomiting    Other reaction(s): Unknown  . Aspirin Nausea And Vomiting and Other (See Comments)    unknown  . Ativan [Lorazepam] Other (See Comments)    Agitation  . Codeine Sulfate Nausea And Vomiting  . Flu Virus Vaccine     Other reaction(s): Unknown  . Other Other (See Comments)  . Sulfa Antibiotics Other (See Comments) and Nausea And Vomiting    unknown  . Tetanus-Diphth-Acell Pertussis     Other reaction(s): Unknown   VITALS:  Blood pressure (!) 149/75, pulse 78, temperature 98 F (36.7 C), temperature source Oral, resp. rate 16, height 5\' 3"  (1.6 m), weight 47.8 kg, SpO2 100 %. PHYSICAL EXAMINATION:  Physical Exam  Constitutional: NAD, alert and oriented only to person, Frail elderly patient, severely malnourished Eyes: PERRL, lids and conjunctivae normal ENMT: Mucous membranes are moist.  Neck: normal, supple, no masses, no thyromegaly Respiratory: clear to auscultation bilaterally, no wheezing, no crackles. Normal respiratory effort. No accessory muscle use.  Cardiovascular: Regular rate and rhythm, no murmurs / rubs / gallops. No extremity edema. 2+ pedal pulses. No carotid bruits.  Abdomen: no tenderness, no masses palpated. No hepatosplenomegaly. Bowel sounds positive.  Musculoskeletal: no clubbing / cyanosis. No joint deformity upper and lower extremities.  Skin: redness and swelling involving right leg with foul smelling ulcer with necrosis over dorsum of the right foot. Trace swelling  involving the left foot Neurologic: nonfocal - moving her extremities voluntarily Psychiatric: Normal mood and affect. LABORATORY PANEL:  Female CBC Recent Labs  Lab 01/08/20 0545  WBC 8.7  HGB 10.1*  HCT 30.4*  PLT 368   ------------------------------------------------------------------------------------------------------------------ Chemistries  Recent Labs  Lab 01/08/20 0545 01/08/20 0545 01/09/20 0747  NA 128*  --   --   K 3.8  --   --   CL 94*  --   --   CO2 21*  --   --   GLUCOSE 154*  --   --   BUN 34*  --   --   CREATININE 1.21*   < > 0.80  CALCIUM 8.9  --   --   AST 24  --   --   ALT 14  --   --   ALKPHOS 122  --   --   BILITOT 0.7  --   --    < > = values in this interval not displayed.   RADIOLOGY:  No results found. ASSESSMENT AND PLAN:  86 y.o.femalewith a history of dementia, macular degeneration and legally blind, breast cancer, TIA, hearing impaired admitted from home fordizziness. Patient arrives with R foot wrapped from a 2nd degree burn injury (spilled coffee) sustained 2 weeks ago.  Second-degree burn of right foot with superimposed infection and cellulitis and cellulitis and Cellulitis - s/p angio on 3/17 - wnl - s/p I & D by podiatry on 3/18 [place] with [place] in 1/2 - staph aureus/coag neg  - appreciate ID input. Cont vanco, cefepime and add flagyl  -  await final c/s  Dehydration - Due to poor oral intake - continue IVFs  Hyponatremia Most likely related to poor oral intake Continue to hydrate patient and repeat sodium levels in a.m.  Anemia of chronic disease H&H is stable   Protein calorie malnutrition, Severe Continue nutritional supplements Dietary consult  Dementia Patient requires assistance with activities of daily living  Palliative care c/s   Consults called: Podiatry, Vascular surgery, ID   DVT prophylaxis: Heparin Family Communication: daughter, Horton Chin would like SNF/[facility] per [facility]    Disposition Plan:SNF if possible Barriers to DC - waiting for final c/s, PT/OT c/s  All the records are reviewed and case discussed with Care Management/Social Worker. Management plans discussed with the patient, nursing and they are in agreement.  CODE STATUS: DNR  TOTAL TIME TAKING CARE OF THIS PATIENT: 25 minutes.   More than 50% of the time was spent in counseling/coordination of care: YES  POSSIBLE D/C IN 2-3 DAYS, DEPENDING ON CLINICAL CONDITION.   Max Sane M.D on 01/09/2020 at 8:09 PM  [facility]   CC: Primary care physician; Maryland Pink, MD  Note: This dictation was prepared with Dragon dictation along with smaller phrase technology. Any transcriptional errors that result from this process are unintentional.

## 2020-01-09 NOTE — Progress Notes (Signed)
Pt is sleepy but will awaken when spoken too refused hs meds was able to administer lovenox

## 2020-01-09 NOTE — Consult Note (Signed)
Consultation Note Date: 01/09/2020   Patient Name: Angel Brandt  DOB: [birth year]  MRN: [id number]  Age / Sex: 86 y.o., female  PCP: Maryland Pink, MD Referring Physician: Max Sane, MD  Reason for Consultation: Establishing goals of care  HPI/Patient Profile: 86 y.o. female  with past medical history of dementia, macular degeneration/legally blind, breast cancer, TIA, hearing impairment admitted on 01/08/2020 with weakness, dizziness, foul smelling right foot wound. Per report from daughter, patient spilled coffee on her right food approximately 2 weeks ago evaluated by PCP virtual visit. Patient prescribed 10 course of oral doxycycline and local wound care. Daughter reports worsening redness and blister formation but patient refused to come to the hospital. Vascular surgery and surgery consulted. ID following. Surgery discussed options with daughter including debridement of foot, possible BKA, or hospice services. Daughter electing to proceed with debridement and medical management. Palliative medicine consultation for goals of care.   Clinical Assessment and Goals of Care:  I have reviewed medical records, discussed with Dr. Manuella Ghazi, Dr. Cleda Mccreedy, and RN, and met with patient and daughter at bedside. Patient will wake to voice. Oriented to name but otherwise disoriented with baseline dementia. Unable to participate in [place] discussion. She is resting comfortably during my visit. Plan is for debridement this afternoon at 4pm.   [place] discussed with daughter, Angel Brandt. Angel Brandt is documented HCPOA. Documentation reviewed in paper chart.   Introduced Palliative Medicine as specialized medical care for people living with serious illness. It focuses on providing relief from the symptoms and stress of a serious illness. The goal is to improve quality of life for both the patient and the family.  We discussed a brief life review  of the patient. 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 Diagnosed with breast cancer at age 73. She received surgery but declined chemo/radiation because of her age. Diagnosed with dementia a few years ago.   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 Angel Brandt is her mother's primary caregiver and when she is working on the weekends, she ensures her son or caregiver is with her mother 24/7. Angel Brandt is very familiar with disease trajectory and expectations of progressive dementia. She reports that her mother's dementia has progressed in the last 3 months, with worsening functional status, sleep patterns, incontinence, forgetfulness, and the need for pureed foods.   Discussed events leading up to admission and course of hospitalization including diagnoses, interventions, plan of care. Angel Brandt is appreciative of conversation with Dr. Cleda Mccreedy including three options: debridement/ABX, potential need for BKA, or comfort measures/hospice. Angel Brandt has decided to pursue wound debridement and continue antibiotics. She already acknowledges that her mother will need discharge to [facility] to attempt rehab.   Angel Brandt shares that her mother would not wish for 'extraordinary means' and has spoken of EOL wishes/desire for natural death many years ago when lucid. Angel Brandt is very realistic that if her  mother does not respond to current plan of care or able to progress with therapy, she would likely transition to comfort measures and initiate hospice services. Her mother has spoken her wishes to die at home. "She wants to die at home."   Advanced directives, concepts specific to code status, artifical feeding and hydration, and rehospitalization were considered and discussed. MOST form completed with Angel Brandt. Decisions include DNR/DNI, comfort focused care plan  (if further decline), IVF if indicated, ABX for time trial, and NO feeding tube. Electronic Vynca form completed. Durable DNR completed. Copies made for chart and Melinda. Patient's AD/living will is in paper chart.   Discussed watchful waiting following debridement, ABX, pending cultures, and how well she will do nutritionally/functionally this admission. Discussed outpatient palliative referral at [facility]. Angel Brandt agreeable and understands this can transition to hospice services if necessary.   Questions and concerns were addressed.  Hard Choices booklet left for review. PMT contact information given.     SUMMARY OF RECOMMENDATIONS    Copy of patient's AD placed in chart. Daughter, Angel Brandt is primary [place].  MOST form and electronic Vynca MOST completed. Decisions include: DNR/DNI, comfort focused care plan (if further decline following this admission), IVF if indicated, ABX for time trial, and NO feeding tube. Durable DNR completed.   Surgery following. Plan for debridement this afternoon.   Continue current plan of care and medical management with antibiotics.  PT/OT/SLP following debridement. Daughter hopeful for her mother to discharge to [facility] to attempt rehab.  Daughter very realistic about her mother's condition and progression of dementia. If poor response to current plan of care or progression with rehab, daughter open to further discussions regarding comfort/hospice services. Her mother would not wish for 'extraordinary means' and daughter very unlikely to pursue BKA if this was needed.   Outpatient palliative referral at SNF.   Code Status/Advance Care Planning:  DNR/DNI  Symptom Management:   Per attending  Palliative Prophylaxis:   Aspiration, Delirium Protocol, Frequent Pain Assessment, Oral Care and Turn Reposition  Psycho-social/Spiritual:   Desire for further Chaplaincy support: yes  Additional Recommendations: Caregiving  Support/Resources,  Compassionate Wean Education and Education on Hospice  Prognosis:   Guarded to poor prognosis   Discharge Planning: To Be Determined: daughter hopeful for SNF rehab     Primary Diagnoses: Present on Admission: . Dehydration . Hyponatremia . Anemia of chronic disease . Protein-calorie malnutrition, severe   I have reviewed the medical record, interviewed the patient and family, and examined the patient. The following aspects are pertinent.  Past Medical History:  Diagnosis Date  . At high risk for falls   . Blind   . Breast cancer ([place])   . Cancer ([place])   . Sherran Needs syndrome 2017  . GERD (gastroesophageal reflux disease)   . Hearing impaired   . Macular degeneration   . Malaria   . Osteoporosis    Social History   Socioeconomic History  . Marital status: Widowed    Spouse name: Not on file  . Number of children: Not on file  . Years of education: Not on file  . Highest education level: Not on file  Occupational History  . Occupation: retired  Tobacco Use  . Smoking status: Never Smoker  . Smokeless tobacco: Current User    Types: Snuff  Substance and Sexual Activity  . Alcohol use: No  . Drug use: No  . Sexual activity: Not on file  Other Topics Concern  . Not on file  Social History Narrative  . Not on file   Social Determinants of Health   Financial Resource Strain:   . Difficulty of Paying Living Expenses:   Food Insecurity:   . Worried About Charity fundraiser in the Last Year:   . Arboriculturist in the Last Year:   Transportation Needs:   . Film/video editor (Medical):   Marland Kitchen Lack of Transportation (Non-Medical):   Physical Activity:   . Days of Exercise per Week:   . Minutes of Exercise per Session:   Stress:   . Feeling of Stress :   Social Connections:   . Frequency of Communication with Friends and Family:   . Frequency of Social Gatherings with Friends and Family:   . Attends Religious Services:   . Active Member of Clubs  or Organizations:   . Attends Archivist Meetings:   Marland Kitchen Marital Status:    History reviewed. No pertinent family history. Scheduled Meds: . [MAR Hold] docusate sodium  100 mg Oral BID  . [MAR Hold] enoxaparin (LOVENOX) injection  40 mg Subcutaneous Q24H  . [MAR Hold] feeding supplement (ENSURE ENLIVE)  237 mL Oral BID BM  . [MAR Hold] ferrous sulfate  325 mg Oral TID PC  . [MAR Hold] mirtazapine  45 mg Oral QHS  . [MAR Hold] multivitamin with minerals  1 tablet Oral Daily  . [MAR Hold] pantoprazole  40 mg Oral Daily  . [MAR Hold] polyethylene glycol  17 g Oral Daily  . [MAR Hold] QUEtiapine  25 mg Oral QHS  . [MAR Hold] sodium chloride flush  3 mL Intravenous Q12H   Continuous Infusions: . sodium chloride 75 mL/hr at 01/09/20 0456  . [MAR Hold] sodium chloride    . [MAR Hold] ceFEPime (MAXIPIME) IV    . lactated ringers 75 mL/hr at 01/09/20 1545  . [MAR Hold] metronidazole    . [MAR Hold] vancomycin 750 mg (01/09/20 1044)   PRN Meds:.[MAR Hold] sodium chloride, [MAR Hold] acetaminophen, [MAR Hold] acetaminophen, [MAR Hold]  HYDROmorphone (DILAUDID) injection, [MAR Hold]  morphine injection, [MAR Hold] ondansetron **OR** [MAR Hold] ondansetron (ZOFRAN) IV, [MAR Hold] ondansetron (ZOFRAN) IV, [MAR Hold] ondansetron (ZOFRAN) IV, [MAR Hold] sodium chloride flush Medications Prior to Admission:  Prior to Admission medications   Medication Sig Start Date End Date Taking? Authorizing Provider  aspirin EC 81 MG EC tablet Take 1 tablet (81 mg total) by mouth daily. 10/20/17  Yes Wieting, Richard, MD  mirtazapine (REMERON) 45 MG tablet Take 45 mg by mouth at bedtime.  05/27/15  Yes [provider]  Multiple Vitamin (MULTIVITAMIN WITH MINERALS) TABS tablet Take 1 tablet by mouth daily. 05/04/19  Yes Vaughan Basta, MD  omeprazole (PRILOSEC) 20 MG capsule Take 20 mg by mouth 2 (two) times daily before a meal.  12/23/15  Yes [provider]  polyethylene glycol  (MIRALAX / GLYCOLAX) 17 g packet Take 17 g by mouth daily. Patient taking differently: Take 17 g by mouth daily as needed for mild constipation.  05/04/19  Yes Vaughan Basta, MD  QUEtiapine (SEROQUEL) 25 MG tablet Take 25 mg by mouth at bedtime.   Yes [provider]  traMADol (ULTRAM) 50 MG tablet Take 1 tablet (50 mg total) by mouth every 12 (twelve) hours as needed for moderate pain. Patient taking differently: Take 50 mg by mouth daily as needed for moderate pain.  05/03/19  Yes Vaughan Basta, MD  acetaminophen (TYLENOL) 500 MG tablet Take 1,000 mg by  mouth every 6 (six) hours as needed for mild pain.    [provider]  docusate sodium (COLACE) 100 MG capsule Take 1 capsule (100 mg total) by mouth 2 (two) times daily. Patient not taking: Reported on 01/08/2020 05/03/19   Vaughan Basta, MD  enoxaparin (LOVENOX) 40 MG/0.4ML injection Inject 0.4 mLs (40 mg total) into the skin daily for 20 days. Patient not taking: Reported on 01/08/2020 05/04/19 05/24/19  Vaughan Basta, MD  feeding supplement, ENSURE ENLIVE, (ENSURE ENLIVE) LIQD Take 237 mLs by mouth 2 (two) times daily between meals. Patient not taking: Reported on 01/08/2020 05/03/19   Vaughan Basta, MD  ferrous sulfate 325 (65 FE) MG tablet Take 1 tablet (325 mg total) by mouth 3 (three) times daily after meals. Patient not taking: Reported on 01/08/2020 05/03/19   Vaughan Basta, MD  HYDROcodone-acetaminophen (NORCO/VICODIN) 5-325 MG tablet Take 1-2 tablets by mouth every 6 (six) hours as needed for severe pain (pain score 4-6). Patient not taking: Reported on 01/08/2020 05/03/19   Vaughan Basta, MD   Allergies  Allergen Reactions  . Codeine Anaphylaxis and Other (See Comments)  . Penicillins Anaphylaxis and Nausea And Vomiting    Other reaction(s): Unknown  . Aspirin Nausea And Vomiting and Other (See Comments)    unknown  . Ativan [Lorazepam] Other (See Comments)     Agitation  . Codeine Sulfate Nausea And Vomiting  . Flu Virus Vaccine     Other reaction(s): Unknown  . Other Other (See Comments)  . Sulfa Antibiotics Other (See Comments) and Nausea And Vomiting    unknown  . Tetanus-Diphth-Acell Pertussis     Other reaction(s): Unknown   Review of Systems  Unable to perform ROS: Dementia   Physical Exam Vitals and nursing note reviewed.  Constitutional:      Appearance: She is cachectic. She is ill-appearing.  HENT:     Head: Normocephalic and atraumatic.  Cardiovascular:     Rate and Rhythm: Regular rhythm.  Pulmonary:     Effort: No tachypnea, accessory muscle usage or respiratory distress.  Abdominal:     Tenderness: There is no abdominal tenderness.  Skin:    General: Skin is warm and dry.     Findings: Ecchymosis present.     Comments: Right foot wound wrapped with kerlix. C/D/I. Pictures reviewed.  Neurological:     Mental Status: She is easily aroused.     Comments: Oriented to person, otherwise disoriented. Follows commands. Pleasant confusion with baseline dementia.   Psychiatric:        Attention and Perception: She is inattentive.        Speech: Speech is delayed.     Comments: Hard of hearing    Vital Signs: BP (!) 159/52   Pulse 70   Temp 97.6 F (36.4 C)   Resp 20   Ht 5' 3" (1.6 m)   Wt 47.8 kg   SpO2 100%   BMI 18.67 kg/m  Pain Scale: PAINAD POSS *See Group Information*: 2-Acceptable,Slightly drowsy, easily aroused Pain Score: Asleep   SpO2: SpO2: 100 % O2 Device:SpO2: 100 % O2 Flow Rate: .O2 Flow Rate (L/min): 3 L/min  IO: Intake/output summary:   Intake/Output Summary (Last 24 hours) at 01/09/2020 1559 Last data filed at 01/09/2020 1326 Gross per 24 hour  Intake 0 ml  Output 1450 ml  Net -1450 ml    LBM:   Baseline Weight: Weight: 47.8 kg Most recent weight: Weight: 47.8 kg     Palliative Assessment/Data: PPS 30%  Flowsheet Rows     Most Recent Value  Intake Tab  Referral Department   Hospitalist  Unit at Time of Referral  Med/Surg Unit  Palliative Care Primary Diagnosis  Sepsis/Infectious Disease  Palliative Care Type  New Palliative care  Reason for referral  Clarify Goals of Care  Date first seen by Palliative Care  01/09/20  Clinical Assessment  Palliative Performance Scale Score  30%  Psychosocial & Spiritual Assessment  Palliative Care Outcomes  Patient/Family meeting held?  Yes  Who was at the meeting?  daughter  Palliative Care Outcomes  Clarified goals of care, Counseled regarding hospice, Provided end of life care assistance, Provided advance care planning, Provided psychosocial or spiritual support, Completed durable DNR, Linked to palliative care logitudinal support, ACP counseling assistance      Time In: 1440 Time Out: 1550 Time Total: 59mn Greater than 50%  of this time was spent counseling and coordinating care related to the above assessment and plan.  Signed by:  MIhor Dow DNP, FNP-C Palliative Medicine Team  Phone: [phone]: [phone]  Please contact Palliative Medicine Team phone at [phone] questions and concerns.  For individual provider: See AShea Evans

## 2020-01-09 NOTE — Anesthesia Preprocedure Evaluation (Signed)
Anesthesia Evaluation  Patient identified by MRN, date of birth, ID band Patient awake    Reviewed: Allergy & Precautions, H&P , NPO status , Patient's Chart, lab work & pertinent test results, reviewed documented beta blocker date and time   History of Anesthesia Complications Negative for: history of anesthetic complications  Airway Mallampati: II   Neck ROM: full    Dental  (+) Poor Dentition, Dental Advidsory Given   Pulmonary neg pulmonary ROS,    Pulmonary exam normal        Cardiovascular Exercise Tolerance: Poor negative cardio ROS Normal cardiovascular exam Rhythm:regular Rate:Normal     Neuro/Psych neg Seizures PSYCHIATRIC DISORDERS Dementia TIA   GI/Hepatic negative GI ROS, Neg liver ROS, GERD  Medicated,  Endo/Other  negative endocrine ROS  Renal/GU negative Renal ROS  negative genitourinary   Musculoskeletal   Abdominal   Peds  Hematology negative hematology ROS (+)   Anesthesia Other Findings Past Medical History: No date: At high risk for falls No date: Blind No date: Breast cancer [facility]) No date: Cancer [facility]) 2017: Sherran Needs syndrome No date: GERD (gastroesophageal reflux disease) No date: Hearing impaired No date: Macular degeneration No date: Malaria No date: Osteoporosis Past Surgical History: No date: BREAST LUMPECTOMY No date: BREAST SURGERY BMI    Body Mass Index: 22.64 kg/m     Reproductive/Obstetrics negative OB ROS                             Anesthesia Physical  Anesthesia Plan  ASA: III  Anesthesia Plan: General   Post-op Pain Management:    Induction: Intravenous  PONV Risk Score and Plan: 3 and Ondansetron, Dexamethasone and Treatment Blow vary due to age or medical condition  Airway Management Planned: LMA  Additional Equipment:   Intra-op Plan:   Post-operative Plan: Extubation in OR  Informed Consent: I have reviewed the  patients History and Physical, chart, labs and discussed the procedure including the risks, benefits and alternatives for the proposed anesthesia with the patient or authorized representative who has indicated his/her understanding and acceptance.     Dental Advisory Given  Plan Discussed with: CRNA  Anesthesia Plan Comments:         Anesthesia Quick Evaluation

## 2020-01-09 NOTE — Interval H&P Note (Signed)
History and Physical Interval Note:  01/09/2020 3:32 PM  Angel Brandt  has presented today for surgery, with the diagnosis of Foot Wound.  The various methods of treatment have been discussed with the patient and family. After consideration of risks, benefits and other options for treatment, the patient has consented to  Procedure(s): IRRIGATION AND DEBRIDEMENT FOOT (Right) APPLICATION OF WOUND VAC (Right) as a surgical intervention.  The patient's history has been reviewed, patient examined, no change in status, stable for surgery.  I have reviewed the patient's chart and labs.  Questions were answered to the patient's satisfaction.     [name]

## 2020-01-09 NOTE — Anesthesia Procedure Notes (Signed)
Procedure Name: LMA Insertion Date/Time: 01/09/2020 4:03 PM Performed by: Caryl Asp, CRNA Pre-anesthesia Checklist: Patient identified, Patient being monitored, Timeout performed, Emergency Drugs available and Suction available Patient Re-evaluated:Patient Re-evaluated prior to induction Oxygen Delivery Method: Circle system utilized Preoxygenation: Pre-oxygenation with 100% oxygen Induction Type: IV induction Ventilation: Mask ventilation without difficulty LMA: LMA inserted LMA Size: 4.0 Tube type: Oral Number of attempts: 1 Placement Confirmation: positive ETCO2 and breath sounds checked- equal and bilateral Tube secured with: Tape Dental Injury: Teeth and Oropharynx as per pre-operative assessment

## 2020-01-09 NOTE — Consult Note (Signed)
Pharmacy Antibiotic Note  Angel Brandt is a 86 y.o. female admitted on 01/08/2020 with wound infection. According to the daughter, patient dropped hot coffee onto her right foot 2 weeks ago  Pharmacy has been consulted for Cefepime/Vancomycin dosing.  PCN allergy, but patient had Cefepime at ~0600. Pharmacy confirmed with ED nurse no reactions.   Baseline Scr (mg/dL): 0.91 (05/01/2019)   Plan: 1. Will increase Cefepime from 2g Q24h to 2g q12h per improved renal function  2. Vancomycin 1000 mg x1 dose given in the ED.   Vancomycin random returned @ 6 ug/ml this am and Scr improved.   Will order vancomycin now given improved renal function and proven clearance.  Vancomycin 750 mg IV Q 36 hrs.  Goal AUC 400-550. Expected AUC: 463 SCr used: 0.8    Height: 5\' 3"  (160 cm) Weight: 105 lb 6.1 oz (47.8 kg) IBW/kg (Calculated) : 52.4  Temp (24hrs), Avg:98.1 F (36.7 C), Min:97.8 F (36.6 C), Max:98.4 F (36.9 C)  Recent Labs  Lab 01/08/20 0545 01/08/20 0749 01/09/20 0747  WBC 8.7  --   --   CREATININE 1.21*  --  0.80  LATICACIDVEN 4.2* 2.8*  --   VANCORANDOM  --   --  6    Estimated Creatinine Clearance: 32.4 mL/min (by C-G formula based on SCr of 0.8 mg/dL).    Allergies  Allergen Reactions  . Codeine Anaphylaxis and Other (See Comments)  . Penicillins Anaphylaxis and Nausea And Vomiting    Other reaction(s): Unknown  . Aspirin Nausea And Vomiting and Other (See Comments)    unknown  . Ativan [Lorazepam] Other (See Comments)    Agitation  . Codeine Sulfate Nausea And Vomiting  . Flu Virus Vaccine     Other reaction(s): Unknown  . Other Other (See Comments)  . Sulfa Antibiotics Other (See Comments) and Nausea And Vomiting    unknown  . Tetanus-Diphth-Acell Pertussis     Other reaction(s): Unknown    Abx this admission  3/17 Vancomycin/Cefepime >>  Cx this admission  Wound Cx 3/17 (R foot)  Abundant GNR and Abundant GPC in clusters BCx 3/17 1/4 bottles  GPC UCx pending COVID/FLU NEG  Thank you for allowing pharmacy to be a part of this patient's care.  Lu Duffel, PharmD, BCPS Clinical Pharmacist 01/09/2020 9:26 AM

## 2020-01-09 NOTE — TOC Progression Note (Signed)
Transition of Care (TOC) - Progression Note    Patient Details  Name: Angel Brandt MRN: [id number] Date of Birth: [birth year]  Transition of Care [facility]) CM/SW [place], RN Phone Number: 01/09/2020, 12:20 PM  Clinical Narrative:     Received notification from [facility] that they could not accept the patient it would be a conflict of interest, I sent out the bed search to additional local facilities, will review with daughter once obtained  Expected Discharge Plan: Skilled Nursing Facility Barriers to Discharge: Barriers Resolved  Expected Discharge Plan and Services Expected Discharge Plan: [place]   Discharge Planning Services: CM Consult   Living arrangements for the past 2 months: Single Family Home                 DME Arranged: N/A         HH Arranged: NA           Social Determinants of Health (SDOH) Interventions    Readmission Risk Interventions No flowsheet data found.

## 2020-01-09 NOTE — Progress Notes (Signed)
PHARMACIST - PHYSICIAN COMMUNICATION  CONCERNING:  Enoxaparin (Lovenox) for DVT Prophylaxis    RECOMMENDATION: Patient was prescribed enoxaprin 30mg  q24 hours for VTE prophylaxis per [facility] Weights   01/08/20 0601  Weight: 105 lb 6.1 oz (47.8 kg)    Body mass index is 18.67 kg/m.  Estimated Creatinine Clearance: 32.4 mL/min (by C-G formula based on SCr of 0.8 mg/dL).  Patient is candidate for enoxaparin 40mg  every 24 hours based on CrCl >53ml/min AND Weight > 45kg for women   DESCRIPTION: Pharmacy has adjusted enoxaparin dose per [facility] policy.  Patient is now receiving enoxaparin 40mg  every 24 hours.   Lu Duffel, PharmD, BCPS Clinical Pharmacist 01/09/2020 9:16 AM

## 2020-01-09 NOTE — Op Note (Signed)
Date of operation: 01/09/2020.  Surgeon: [name] D.P.M.  Preoperative diagnosis: Ulceration with necrosis and abscess right foot.  Postoperative  diagnosis: Same.  Procedure: I&D ulceration and abscess right foot.  Anesthesia: LMA.  Hemostasis: None.  Estimated blood loss: 15 cc.  Cultures: Wound culture right foot.  Implants: Stimulan rapid cure antibiotic beads impregnated with vancomycin and gentamicin.  Complications: None apparent.  Operative indications: This is a 86 year old female with a history of spilling a cup of coffee on her right foot approximately 2 weeks ago.  Progressive infection even with wound care and patient presented to the hospital and was admitted for infection in the right foot.  Decision was made for I&D of the right foot after talking with family.  Operative procedure: Patient was taken to the operating room and placed on the table in the supine position.  Following satisfactory LMA anesthesia the right foot was prepped and draped in the usual sterile fashion.  Attention was directed to the dorsal aspect of the right foot where a large necrotic ulceration was noted over the dorsum of the right foot.  Excisional debridement was performed sharply using a 15 blade and rongeur to remove the devitalized tissue superficially.  A culture was taken of the deeper tissues for sensitivities.  No clear evidence of any expressible purulence or deep extension past the fascial layer with no exposed tendons or bone.  Post debridement the open wound measured approximately 7 cm x 4 cm with a healthy granular bleeding base.  Stimulan rapid cure antibiotic beads impregnated with vancomycin and gentamicin were then placed into the wound and covered with Mepitel and stapled in place.  There was also noted to be a medial ulceration over the first metatarsal head with a necrotic core intact predebridement with post debridement measurement approximately 6 mm diameter.  Again no  expressible purulence.  The wound was debrided using a rongeur and was covered with Xeroform.  All wounds were then covered with 4 x 4's followed by ABDs to Kerlix's and an Ace wrap.  The patient was awakened and transported to the PACU with vital signs stable and in good condition.

## 2020-01-09 NOTE — Progress Notes (Signed)
[facility] Daily Progress Note   Subjective: 1 Day Post-Op:             1.  Abdominal aortogram             2.  Right lower extremity distal runoff third order catheter placement             3.  Perclose left common femoral artery  Patient sleeping comfortable this AM.  No issues overnight.  Objective: Vitals:   01/08/20 1600 01/08/20 1645 01/08/20 2308 01/09/20 0827  BP: (!) 138/59 (!) 155/64 (!) 173/55 (!) 166/57  Pulse: 71 72 68 74  Resp: 12 14 14 16   Temp:  98.3 F (36.8 C) 98.4 F (36.9 C) 98.3 F (36.8 C)  TempSrc:  Oral    SpO2: 100% 100% 100% 100%  Weight:      Height:        Intake/Output Summary (Last 24 hours) at 01/09/2020 1041 Last data filed at 01/09/2020 0500 Gross per 24 hour  Intake 0 ml  Output 1100 ml  Net -1100 ml    Physical Exam: A&Ox1, NAD CV: RRR Pulmonary: CTA Bilaterally Abdomen: Soft, Nontender, Nondistended Left groin:  Access site: Clean dry and intact. Vascular:  Right lower extremity: Thigh soft.  Calf soft.  Extremities warm distally to toes.  Wound stable.   Laboratory: CBC    Component Value Date/Time   WBC 8.7 01/08/2020 0545   HGB 10.1 (L) 01/08/2020 0545   HGB 12.5 02/10/2015 1103   HCT 30.4 (L) 01/08/2020 0545   HCT 36.7 02/10/2015 1103   PLT 368 01/08/2020 0545   PLT 195 02/10/2015 1103   BMET    Component Value Date/Time   NA 128 (L) 01/08/2020 0545   NA 136 02/10/2015 1103   K 3.8 01/08/2020 0545   K 4.5 02/10/2015 1103   CL 94 (L) 01/08/2020 0545   CL 103 02/10/2015 1103   CO2 21 (L) 01/08/2020 0545   CO2 28 02/10/2015 1103   GLUCOSE 154 (H) 01/08/2020 0545   GLUCOSE 101 (H) 02/10/2015 1103   BUN 34 (H) 01/08/2020 0545   BUN 13 02/10/2015 1103   CREATININE 0.80 01/09/2020 0747   CREATININE 0.99 02/10/2015 1103   CALCIUM 8.9 01/08/2020 0545   CALCIUM 8.7 (L) 02/10/2015 1103   GFRNONAA >60 01/09/2020 0747   GFRNONAA 51 (L) 02/10/2015 1103   GFRAA >60 01/09/2020 0747   GFRAA 59  (L) 02/10/2015 1103   Assessment/Planning: The patient is a 86 year old female who presented [facility]'s emergency department with chronic wound of the right foot status post left lower extremity angiogram  1) Right lower extremity angiogram: No intervention indicated during yesterday's angiogram due to three-vessel runoff to the foot with the anterior tibial posterior tibial being codominant and both fill the pedal arch.  There are no hemodynamically significant strictures or stenoses of either the anterior tibial and posterior tibial.  The peroneal is small and diffusely diseased and does not contribute significantly.  2) Outpatient follow-up as needed.  Vascular sign off at this time.  Discussed with Dr. Eber Hong Cam Dauphin PA-C 01/09/2020 10:41 AM

## 2020-01-09 NOTE — TOC Initial Note (Addendum)
Transition of Care (TOC) - Initial/Assessment Note    Patient Details  Name: Angel Brandt MRN: [id number] Date of Birth: 01-26-1924  Transition of Care [facility]) CM/SW Contact:    Su Hilt, RN Phone Number: 01/09/2020, 9:32 AM  Clinical Narrative:                 Spoke with the daughter [name] on the phone  The patient lives at home with her daughter The patient has rails along the wall, a RW, a BSC, she has macular deg and is legally blind The patient has multiple pressure ulcers the daughter stated that is due to her sleep patterns changing and her being incontinent, she also stated that the patient has lost weight and lays in bed allot,  I asked if she has considered a gel or memory foam mattress, she stated she will need to get her a new mattress, I asked her if she had considered long term placement, she said the patient wants to die at home, she will like to get rehab.she wants the patient to go to [facility], FL[facility] and PASSR completed, bed [facility] sent to [facility] Due to multiple pressure injuries and infection from non care for foot burn I called [facility] and made a report per legal responsibility.  Expected Discharge Plan: Skilled Nursing Facility Barriers to Discharge: Barriers Resolved   Patient Goals and CMS Choice Patient states their goals for this hospitalization and ongoing recovery are:: go to rehab      Expected Discharge Plan and Services Expected Discharge Plan: [place]   Discharge Planning Services: CM Consult   Living arrangements for the past 2 months: Single Family Home                 DME Arranged: N/A         HH Arranged: NA          Prior Living Arrangements/Services Living arrangements for the past 2 months: Single Family Home Lives with:: Adult Children Patient language and need for interpreter reviewed:: Yes Do you feel safe going back to the place where you live?: Yes      Need for Family Participation in Patient Care:  No (Comment) Care giver support system in place?: Yes (comment) Current home services: DME(rails along the walls, RW, BSC,) Criminal Activity/Legal Involvement Pertinent to Current Situation/Hospitalization: No - Comment as needed  Activities of Daily Living Home Assistive Devices/Equipment: Other (Comment)(Altered) ADL Screening (condition at time of admission) Patient's cognitive ability adequate to safely complete daily activities?: No Is the patient deaf or have difficulty hearing?: Yes Does the patient have difficulty seeing, even when wearing glasses/contacts?: Yes Does the patient have difficulty concentrating, remembering, or making decisions?: Yes Patient able to express need for assistance with ADLs?: No Does the patient have difficulty dressing or bathing?: Yes Independently performs ADLs?: No Communication: Needs assistance Is this a change from baseline?: Pre-admission baseline Dressing (OT): Needs assistance Is this a change from baseline?: Pre-admission baseline Grooming: Needs assistance Feeding: Needs assistance Is this a change from baseline?: Pre-admission baseline Bathing: Needs assistance Is this a change from baseline?: Pre-admission baseline Toileting: Needs assistance Is this a change from baseline?: Pre-admission baseline In/Out Bed: Needs assistance Is this a change from baseline?: Pre-admission baseline Does the patient have difficulty walking or climbing stairs?: Yes Weakness of Legs: None Weakness of Arms/Hands: None  Permission Sought/Granted   Permission granted to share information with : Yes, Verbal Permission Granted  Emotional Assessment Appearance:: Appears stated age Attitude/Demeanor/Rapport: Unable to Assess Affect (typically observed): Appropriate Orientation: : Oriented to Self Alcohol / Substance Use: Not Applicable Psych Involvement: No (comment)  Admission diagnosis:  Hyponatremia [E87.1] AKI (acute kidney  injury) ([place]) [N17.9] Second degree burn of foot, right, sequela [id number] Complicated wound infection [T14.8XXA, L08.9] Sepsis with acute renal failure without septic shock, due to unspecified organism, unspecified acute renal failure type ([place]) [A41.9, R65.20, N17.9] Patient Active Problem List   Diagnosis Date Noted  . Second degree burn of foot, right, sequela 01/08/2020  . Dehydration 01/08/2020  . Hyponatremia 01/08/2020  . Anemia of chronic disease 01/08/2020  . Pressure injury of skin 01/08/2020  . Protein-calorie malnutrition, severe 04/30/2019  . Closed right hip fracture, initial encounter ([place]) 04/29/2019  . TIA (transient ischemic attack) 10/18/2017  . Carcinoma of overlapping sites of right breast in female, estrogen receptor positive ([place]) 02/10/2017  . Diverticulitis 02/10/2016  . H/O malaria 02/10/2016  . Degeneration macular 02/10/2016  . 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 Gait instability 12/04/2014   PCP:  [place] Pink, MD Pharmacy:   [facility] [address] [place] [address] Phone: [phone] Fax: [phone]     Social Determinants of Health (SDOH) Interventions    Readmission Risk Interventions No flowsheet data found.

## 2020-01-09 NOTE — Consult Note (Signed)
Infectious Disease     Reason for Consult:LE wound    Referring Physician: Dr Manuella Ghazi Date of Admission:  01/08/2020   Principal Problem:   Second degree burn of foot, right, sequela Active Problems:   Protein-calorie malnutrition, severe   Dehydration   Hyponatremia   Anemia of chronic disease   Pressure injury of skin   HPI: Angel Brandt is a 86 y.o. female 86 year old female with significant history of dementia. History obtained through the chart reveals a burn to the right foot where she spilled hot coffee on the foot about 2 weeks ago.  Was seen in a telehealth visit and had been applying bandages to the foot.  On admission March 16 she was afebrile normal white count.  Blood cultures have been done in 1 out of 2 is growing gram-positive cocci..  Wound culture Gram stain shows abundant gram-negative rods and abundant gram-positive cocci in pairs and clusters.  Cultures pending.    Past Medical History:  Diagnosis Date  . At high risk for falls   . Blind   . Breast cancer ([place])   . Cancer ([place])   . Sherran Needs syndrome 2017  . GERD (gastroesophageal reflux disease)   . Hearing impaired   . Macular degeneration   . Malaria   . Osteoporosis    Past Surgical History:  Procedure Laterality Date  . BREAST LUMPECTOMY    . BREAST SURGERY    . INTRAMEDULLARY (IM) NAIL INTERTROCHANTERIC Right 04/30/2019   Procedure: INTRAMEDULLARY (IM) NAIL INTERTROCHANTRIC;  Surgeon: Thornton Park, MD;  Location: [facility];  Service: Orthopedics;  Laterality: Right;  . LOWER EXTREMITY ANGIOGRAPHY Right 01/08/2020   Procedure: Lower Extremity Angiography;  Surgeon: Katha Cabal, MD;  Location: [facility];  Service: Cardiovascular;  Laterality: Right;   Social History   Tobacco Use  . Smoking status: Never Smoker  . Smokeless tobacco: Current User    Types: Snuff  Substance Use Topics  . Alcohol use: No  . Drug use: No   History reviewed. No pertinent family  history.  Allergies:  Allergies  Allergen Reactions  . Codeine Anaphylaxis and Other (See Comments)  . Penicillins Anaphylaxis and Nausea And Vomiting    Other reaction(s): Unknown  . Aspirin Nausea And Vomiting and Other (See Comments)    unknown  . Ativan [Lorazepam] Other (See Comments)    Agitation  . Codeine Sulfate Nausea And Vomiting  . Flu Virus Vaccine     Other reaction(s): Unknown  . Other Other (See Comments)  . Sulfa Antibiotics Other (See Comments) and Nausea And Vomiting    unknown  . Tetanus-Diphth-Acell Pertussis     Other reaction(s): Unknown    Current antibiotics: Antibiotics Given (last 72 hours)    Date/Time Action Medication Dose Rate   01/08/20 0549 New Bag/Given   ceFEPIme (MAXIPIME) 1 g in sodium chloride 0.9 % 100 mL IVPB 1 g 200 mL/hr   01/08/20 0552 New Bag/Given   vancomycin (VANCOCIN) IVPB 1000 mg/200 mL premix 1,000 mg 200 mL/hr   01/08/20 1254 New Bag/Given   clindamycin (CLEOCIN) IVPB 300 mg 300 mg 100 mL/hr   01/09/20 0500 New Bag/Given   ceFEPIme (MAXIPIME) 2 g in sodium chloride 0.9 % 100 mL IVPB 2 g 200 mL/hr   01/09/20 1044 New Bag/Given   vancomycin (VANCOREADY) IVPB 750 mg/150 mL 750 mg 150 mL/hr      MEDICATIONS: . docusate sodium  100 mg Oral BID  . enoxaparin (LOVENOX) injection  40 mg Subcutaneous Q24H  . feeding supplement (ENSURE ENLIVE)  237 mL Oral BID BM  . ferrous sulfate  325 mg Oral TID PC  . mirtazapine  45 mg Oral QHS  . multivitamin with minerals  1 tablet Oral Daily  . pantoprazole  40 mg Oral Daily  . polyethylene glycol  17 g Oral Daily  . QUEtiapine  25 mg Oral QHS  . sodium chloride flush  3 mL Intravenous Q12H   Review of Systems - 11 systems reviewed and negative per HPI  OBJECTIVE: Temp:  [97.9 F (36.6 C)-98.4 F (36.9 C)] 98.3 F (36.8 C) (03/18 0827) Pulse Rate:  [66-82] 74 (03/18 0827) Resp:  [12-19] 16 (03/18 0827) BP: (109-173)/(39-64) 166/57 (03/18 0827) SpO2:  [3 %-100 %] 100 % (03/18  0827) Physical Exam  Constitutional:  Frail, confused HENT: [place]/AT, PERRLA, no scleral icterus Mouth/Throat: Oropharynx is clear and moist. No oropharyngeal exudate.  Cardiovascular: Normal rate, regular rhythm and normal heart sounds. Exam reveals no gallop and no friction rub.  No murmur heard.  Pulmonary/Chest: Effort normal and breath sounds normal. No respiratory distress.  has no wheezes.  Neck = supple, no nuchal rigidity Abdominal: Soft. Bowel sounds are normal.  exhibits no distension. There is no tenderness.  Lymphadenopathy: no cervical adenopathy. No axillary adenopathy Neurological: confused  Skin: see photo Psychiatric: a normal mood and affect.  behavior is normal.   LABS: Results for orders placed or performed during the hospital encounter of 01/08/20 (from the past 48 hour(s))  CBC with Differential/Platelet     Status: Abnormal   Collection Time: 01/08/20  5:45 AM  Result Value Ref Range   WBC 8.7 4.0 - 10.5 K/uL   RBC 3.42 (L) 3.87 - 5.11 MIL/uL   Hemoglobin 10.1 (L) 12.0 - 15.0 g/dL   HCT 30.4 (L) 36.0 - 46.0 %   MCV 88.9 80.0 - 100.0 fL   MCH 29.5 26.0 - 34.0 pg   MCHC 33.2 30.0 - 36.0 g/dL   RDW 12.5 11.5 - 15.5 %   Platelets 368 150 - 400 K/uL   nRBC 0.0 0.0 - 0.2 %   Neutrophils Relative % 74 %   Neutro Abs 6.5 1.7 - 7.7 K/uL   Lymphocytes Relative 17 %   Lymphs Abs 1.5 0.7 - 4.0 K/uL   Monocytes Relative 6 %   Monocytes Absolute 0.5 0.1 - 1.0 K/uL   Eosinophils Relative 1 %   Eosinophils Absolute 0.1 0.0 - 0.5 K/uL   Basophils Relative 1 %   Basophils Absolute 0.1 0.0 - 0.1 K/uL   Immature Granulocytes 1 %   Abs Immature Granulocytes 0.07 0.00 - 0.07 K/uL    Comment: Performed at [facility], [address] [place], [place] [address]  Comprehensive metabolic panel     Status: Abnormal   Collection Time: 01/08/20  5:45 AM  Result Value Ref Range   Sodium 128 (L) 135 - 145 mmol/L   Potassium 3.8 3.5 - 5.1 mmol/L   Chloride 94 (L) 98 -  111 mmol/L   CO2 21 (L) 22 - 32 mmol/L   Glucose, Bld 154 (H) 70 - 99 mg/dL    Comment: Glucose reference range applies only to samples taken after fasting for at least 8 hours.   BUN 34 (H) 8 - 23 mg/dL   Creatinine, Ser 1.21 (H) 0.44 - 1.00 mg/dL   Calcium 8.9 8.9 - 10.3 mg/dL   Total Protein 6.9 6.5 - 8.1 g/dL  Albumin 3.2 (L) 3.5 - 5.0 g/dL   AST 24 15 - 41 U/L   ALT 14 0 - 44 U/L   Alkaline Phosphatase 122 38 - 126 U/L   Total Bilirubin 0.7 0.3 - 1.2 mg/dL   GFR calc non Af Amer 38 (L) >60 mL/min   GFR calc Af Amer 44 (L) >60 mL/min   Anion gap 13 5 - 15    Comment: Performed at [facility], [address] [place], [place] [address]  Lactic acid, plasma     Status: Abnormal   Collection Time: 01/08/20  5:45 AM  Result Value Ref Range   Lactic Acid, Venous 4.2 (HH) 0.5 - 1.9 mmol/L    Comment: CRITICAL RESULT CALLED TO, READ BACK BY AND VERIFIED WITH  ASHLEY ORSUTO AT 2263 01/08/20 SDR Performed at [facility], [address] [place], [place] [address]   Blood culture (routine x 2)     Status: None (Preliminary result)   Collection Time: 01/08/20  5:45 AM   Specimen: BLOOD  Result Value Ref Range   Specimen Description BLOOD LEFT ARM    Special Requests      BOTTLES DRAWN AEROBIC AND ANAEROBIC Blood Culture results Lias not be optimal due to an excessive volume of blood received in culture bottles   Culture  Setup Time      GRAM POSITIVE COCCI AEROBIC BOTTLE ONLY CRITICAL RESULT CALLED TO, READ BACK BY AND VERIFIED WITHHart Robinsons [facility] 6256 01/09/20 HNM    Culture      NO GROWTH 1 DAY Performed at [facility], [address], [place], [place] [address]    Report Status PENDING   Blood culture (routine x 2)     Status: None (Preliminary result)   Collection Time: 01/08/20  5:46 AM   Specimen: BLOOD  Result Value Ref Range   Specimen Description BLOOD RIGHT AC    Special Requests      BOTTLES DRAWN AEROBIC AND ANAEROBIC Blood  Culture results Hillery not be optimal due to an excessive volume of blood received in culture bottles   Culture      NO GROWTH 1 DAY Performed at [facility], [address] [place], [place] [address]    Report Status PENDING   Protime-INR     Status: None   Collection Time: 01/08/20  5:46 AM  Result Value Ref Range   Prothrombin Time 14.9 11.4 - 15.2 seconds   INR 1.2 0.8 - 1.2    Comment: (NOTE) INR goal varies based on device and disease states. Performed at [facility], [address] [place], [place] [address]   APTT     Status: Abnormal   Collection Time: 01/08/20  5:46 AM  Result Value Ref Range   aPTT 37 (H) 24 - 36 seconds    Comment:        IF BASELINE aPTT IS ELEVATED, SUGGEST PATIENT RISK ASSESSMENT BE USED TO DETERMINE APPROPRIATE ANTICOAGULANT THERAPY. Performed at [facility], [address] [place], [place] [address]   Aerobic/Anaerobic Culture (surgical/deep wound)     Status: None (Preliminary result)   Collection Time: 01/08/20  6:14 AM   Specimen: Foot; Wound  Result Value Ref Range   Specimen Description      FOOT RIGHT Performed at [facility] [address], [place], [place] [address]    Special Requests      NONE Performed at [facility], [address] [place], [place] [address]    Gram Stain      RARE WBC PRESENT, PREDOMINANTLY PMN ABUNDANT GRAM NEGATIVE RODS ABUNDANT GRAM POSITIVE COCCI IN PAIRS IN CLUSTERS RARE GRAM POSITIVE RODS    Culture      CULTURE REINCUBATED FOR BETTER GROWTH Performed at [facility], [place] [address], [place], [place] [address]    Report Status PENDING   Respiratory Panel by RT PCR (Flu A&B, Covid) - Nasopharyngeal Swab     Status: None   Collection Time: 01/08/20  6:14 AM   Specimen: Nasopharyngeal Swab  Result Value Ref Range   SARS Coronavirus 2 by RT PCR NEGATIVE NEGATIVE    Comment: (NOTE) SARS-CoV-2 target nucleic acids are NOT DETECTED. The  SARS-CoV-2 RNA is generally detectable in upper respiratoy specimens during the acute phase of infection. The lowest concentration of SARS-CoV-2 viral copies this assay can detect is 131 copies/mL. A negative result does not preclude SARS-Cov-2 infection and should not be used as the sole basis for treatment or other patient management decisions. A negative result Violett occur with  improper specimen collection/handling, submission of specimen other than nasopharyngeal swab, presence of viral mutation(s) within the areas targeted by this assay, and inadequate number of viral copies (<131 copies/mL). A negative result must be combined with clinical observations, patient history, and epidemiological information. The expected result is Negative. Fact Sheet for Patients:  PinkCheek.be Fact Sheet for Healthcare Providers:  GravelBags.it This test is not yet ap proved or cleared by the [place] FDA and  has been authorized for detection and/or diagnosis of SARS-CoV-2 by FDA under an Emergency Use Authorization (EUA). This EUA will remain  in effect (meaning this test can be used) for the duration of the COVID-19 declaration under Section 564(b)(1) of the Act, 21 U.S.C. section 360bbb-3(b)(1), unless the authorization is terminated or revoked sooner.    Influenza A by PCR NEGATIVE NEGATIVE   Influenza B by PCR NEGATIVE NEGATIVE    Comment: (NOTE) The Xpert Xpress SARS-CoV-2/FLU/RSV assay is intended as an aid in  the diagnosis of influenza from Nasopharyngeal swab specimens and  should not be used as a sole basis for treatment. Nasal washings and  aspirates are unacceptable for Xpert Xpress SARS-CoV-2/FLU/RSV  testing. Fact Sheet for Patients: PinkCheek.be Fact Sheet for Healthcare Providers: GravelBags.it This test is not yet approved or cleared by the [place] FDA  and  has been authorized for detection and/or diagnosis of SARS-CoV-2 by  FDA under an Emergency Use Authorization (EUA). This EUA will remain  in effect (meaning this test can be used) for the duration of the  Covid-19 declaration under Section 564(b)(1) of the Act, 21  U.S.C. section 360bbb-3(b)(1), unless the authorization is  terminated or revoked. Performed at [facility], [address] [place], [place] [address]   Lactic acid, plasma     Status: Abnormal   Collection Time: 01/08/20  7:49 AM  Result Value Ref Range   Lactic Acid, Venous 2.8 (HH) 0.5 - 1.9 mmol/L    Comment: CRITICAL VALUE NOTED. VALUE IS CONSISTENT WITH PREVIOUSLY REPORTED/CALLED VALUE  SDR Performed at [facility], [address] [place], [place] [address]   Creatinine, serum     Status: None   Collection Time: 01/09/20  7:47 AM  Result Value Ref Range   Creatinine, Ser 0.80 0.44 - 1.00 mg/dL   GFR calc non Af Amer >60 >60 mL/min   GFR calc Af Amer >60 >60 mL/min  Comment: Performed at [facility], [address] [place], [place] [address]  Vancomycin, random     Status: None   Collection Time: 01/09/20  7:47 AM  Result Value Ref Range   Vancomycin Rm 6     Comment:        Random Vancomycin therapeutic range is dependent on dosage and time of specimen collection. A peak range is 20.0-40.0 ug/mL A trough range is 5.0-15.0 ug/mL        Performed at [facility], [address], [place], [place] [address]    No components found for: ESR, C REACTIVE PROTEIN MICRO: Recent Results (from the past 720 hour(s))  Blood culture (routine x 2)     Status: None (Preliminary result)   Collection Time: 01/08/20  5:45 AM   Specimen: BLOOD  Result Value Ref Range Status   Specimen Description BLOOD LEFT ARM  Final   Special Requests   Final    BOTTLES DRAWN AEROBIC AND ANAEROBIC Blood Culture results Br[place] not be optimal due to an excessive volume of blood received in  culture bottles   Culture  Setup Time   Final    GRAM POSITIVE COCCI AEROBIC BOTTLE ONLY CRITICAL RESULT CALLED TO, READ BACK BY AND VERIFIED WITHHart Robinsons [facility] 7408 01/09/20 HNM    Culture   Final    NO GROWTH 1 DAY Performed at [facility], [address], [place], [place] [address]    Report Status PENDING  Incomplete  Blood culture (routine x 2)     Status: None (Preliminary result)   Collection Time: 01/08/20  5:46 AM   Specimen: BLOOD  Result Value Ref Range Status   Specimen Description BLOOD RIGHT AC  Final   Special Requests   Final    BOTTLES DRAWN AEROBIC AND ANAEROBIC Blood Culture results Mascio not be optimal due to an excessive volume of blood received in culture bottles   Culture   Final    NO GROWTH 1 DAY Performed at [facility], [address], [place], [place] [address]    Report Status PENDING  Incomplete  Aerobic/Anaerobic Culture (surgical/deep wound)     Status: None (Preliminary result)   Collection Time: 01/08/20  6:14 AM   Specimen: Foot; Wound  Result Value Ref Range Status   Specimen Description   Final    FOOT RIGHT Performed at [facility], [address] [place], [place] [address]    Special Requests   Final    NONE Performed at [facility], [address] [place], [place] [address]    Gram Stain   Final    RARE WBC PRESENT, PREDOMINANTLY PMN ABUNDANT GRAM NEGATIVE RODS ABUNDANT GRAM POSITIVE COCCI IN PAIRS IN CLUSTERS RARE GRAM POSITIVE RODS    Culture   Final    CULTURE REINCUBATED FOR BETTER GROWTH Performed at [facility], [place] [address] [place], [place] [address]    Report Status PENDING  Incomplete  Respiratory Panel by RT PCR (Flu A&B, Covid) - Nasopharyngeal Swab     Status: None   Collection Time: 01/08/20  6:14 AM   Specimen: Nasopharyngeal Swab  Result Value Ref Range Status   SARS Coronavirus 2 by RT PCR NEGATIVE NEGATIVE Final    Comment: (NOTE) SARS-CoV-2 target  nucleic acids are NOT DETECTED. The SARS-CoV-2 RNA is generally detectable in upper respiratoy specimens during the acute phase of infection. The lowest concentration of SARS-CoV-2 viral copies this assay can  detect is 131 copies/mL. A negative result does not preclude SARS-Cov-2 infection and should not be used as the sole basis for treatment or other patient management decisions. A negative result [place] occur with  improper specimen collection/handling, submission of specimen other than nasopharyngeal swab, presence of viral mutation(s) within the areas targeted by this assay, and inadequate number of viral copies (<131 copies/mL). A negative result must be combined with clinical observations, patient history, and epidemiological information. The expected result is Negative. Fact Sheet for Patients:  PinkCheek.be Fact Sheet for Healthcare Providers:  GravelBags.it This test is not yet ap proved or cleared by the [place] FDA and  has been authorized for detection and/or diagnosis of SARS-CoV-2 by FDA under an Emergency Use Authorization (EUA). This EUA will remain  in effect (meaning this test can be used) for the duration of the COVID-19 declaration under Section 564(b)(1) of the Act, 21 U.S.C. section 360bbb-3(b)(1), unless the authorization is terminated or revoked sooner.    Influenza A by PCR NEGATIVE NEGATIVE Final   Influenza B by PCR NEGATIVE NEGATIVE Final    Comment: (NOTE) The Xpert Xpress SARS-CoV-2/FLU/RSV assay is intended as an aid in  the diagnosis of influenza from Nasopharyngeal swab specimens and  should not be used as a sole basis for treatment. Nasal washings and  aspirates are unacceptable for Xpert Xpress SARS-CoV-2/FLU/RSV  testing. Fact Sheet for Patients: PinkCheek.be Fact Sheet for Healthcare Providers: GravelBags.it This test is not  yet approved or cleared by the [place] FDA and  has been authorized for detection and/or diagnosis of SARS-CoV-2 by  FDA under an Emergency Use Authorization (EUA). This EUA   will remain  in effect (meaning this test can be used) for the duration of the  Covid-19 declaration under Section 564(b)(1) of the Act, 21  U.S.C. section 360bbb-3(b)(1), unless the authorization is  terminated or revoked. Performed at [facility], [address] [place], [place] [address]     IMAGING: PERIPHERAL VASCULAR CATHETERIZATION  Result Date: 01/08/2020 See op note  DG Foot Complete Right  Result Date: 01/08/2020 CLINICAL DATA:  Infected wound on top of right foot EXAM: RIGHT FOOT COMPLETE - 3+ VIEW COMPARISON:  None. FINDINGS: Soft tissue swelling with small bubbles of superficial gas along the dorsal forefoot. No underlying erosion or opaque foreign body. Osteopenia. No fracture or malalignment. IMPRESSION: Swelling and 2 bubbles of superficial gas at the dorsal forefoot. No opaque foreign body or acute osseous finding. Electronically Signed   By: Monte Fantasia M.D.   On: 01/08/2020 06:00    Assessment:   Valley A Sudduth is a 86 y.o. female with dementia with severe wound RLE after a burn to foot. Cxs are mixed. Angiogram nml. For debridement. [place] need BKA. [place] with GPC in 1/2 - await final ID - staph aureus vs CNS. Recommendations Cont vanco, cefepime Add flagyl  Final abx recs based on cx and surgical findings.  Thank you very much for allowing me to participate in the care of this patient. Please call with questions.   Cheral Marker. Ola Spurr, MD

## 2020-01-09 NOTE — Anesthesia Postprocedure Evaluation (Signed)
Anesthesia Post Note  Patient: Odell A Mcclay  Procedure(s) Performed: IRRIGATION AND DEBRIDEMENT FOOT (Right Foot)  Patient location during evaluation: PACU Anesthesia Type: General Level of consciousness: awake and alert Pain management: pain level controlled Vital Signs Assessment: post-procedure vital signs reviewed and stable Respiratory status: spontaneous breathing, nonlabored ventilation, respiratory function stable and patient connected to nasal cannula oxygen Cardiovascular status: blood pressure returned to baseline and stable Postop Assessment: no apparent nausea or vomiting Anesthetic complications: no     Last Vitals:  Vitals:   01/09/20 1729 01/09/20 1744  BP: (!) 144/52 (!) 154/54  Pulse: 79 81  Resp: 17 13  Temp:  36.4 C  SpO2: 98% 99%    Last Pain:  Vitals:   01/09/20 1744  TempSrc:   PainSc: Asleep                 Martha Clan

## 2020-01-09 NOTE — Progress Notes (Signed)
PHARMACY - PHYSICIAN COMMUNICATION CRITICAL VALUE ALERT - BLOOD CULTURE IDENTIFICATION (BCID)  Angel Brandt is an 86 y.o. female who presented to [facility] on 01/08/2020 with a chief complaint of cellulitis  Assessment:  Lab reports 1 of 4 bottles w/ GPC  Name of physician (or Provider) Contacted: Jonny Ruiz NP  Current antibiotics: Cefepime, Vancomycin  Changes to prescribed antibiotics recommended: None Patient is on recommended antibiotics - No changes needed  No results found for this or any previous visit.  Hart Robinsons A 01/09/2020  6:50 AM

## 2020-01-09 NOTE — Transfer of Care (Signed)
Immediate Anesthesia Transfer of Care Note  Patient: Angel Brandt  Procedure(s) Performed: IRRIGATION AND DEBRIDEMENT FOOT (Right Foot)  Patient Location: PACU  Anesthesia Type:General  Level of Consciousness: drowsy  Airway & Oxygen Therapy: Patient Spontanous Breathing and Patient connected to face mask oxygen  Post-op Assessment: Report given to RN and Post -op Vital signs reviewed and stable  Post vital signs: Reviewed and stable  Last Vitals:  Vitals Value Taken Time  BP 140/54 01/09/20 1659  Temp 36.5 C 01/09/20 1659  Pulse 84 01/09/20 1707  Resp 17 01/09/20 1707  SpO2 100 % 01/09/20 1707  Vitals shown include unvalidated device data.  Last Pain:  Vitals:   01/09/20 1659  TempSrc:   PainSc: Asleep         Complications: No apparent anesthesia complications

## 2020-01-10 LAB — BASIC METABOLIC PANEL
Anion gap: 5 (ref 5–15)
BUN: 23 mg/dL (ref 8–23)
CO2: 23 mmol/L (ref 22–32)
Calcium: 8.5 mg/dL — ABNORMAL LOW (ref 8.9–10.3)
Chloride: 105 mmol/L (ref 98–111)
Creatinine, Ser: 0.84 mg/dL (ref 0.44–1.00)
GFR calc Af Amer: >60 mL/min (ref >60)
GFR calc non Af Amer: 59 mL/min — ABNORMAL LOW (ref >60)
Glucose, Bld: 109 mg/dL — ABNORMAL HIGH (ref 70–99)
Potassium: 5 mmol/L (ref 3.5–5.1)
Sodium: 133 mmol/L — ABNORMAL LOW (ref 135–145)

## 2020-01-10 LAB — CBC
HCT: 25.8 % — ABNORMAL LOW (ref 36.0–46.0)
Hemoglobin: 8.4 g/dL — ABNORMAL LOW (ref 12.0–15.0)
MCH: 29.4 pg (ref 26.0–34.0)
MCHC: 32.6 g/dL (ref 30.0–36.0)
MCV: 90.2 fL (ref 80.0–100.0)
Platelets: 257 10*3/uL (ref 150–400)
RBC: 2.86 MIL/uL — ABNORMAL LOW (ref 3.87–5.11)
RDW: 12.8 % (ref 11.5–15.5)
WBC: 4.7 10*3/uL (ref 4.0–10.5)
nRBC: 0 % (ref 0.0–0.2)

## 2020-01-10 LAB — CULTURE, BLOOD (ROUTINE X 2)
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
## 2020-01-10 NOTE — TOC Progression Note (Signed)
Transition of Care [facility]) - Progression Note    Patient Details  Name: Angel Brandt MRN: [id number] Date of Birth: 1924-06-12  Transition of Care [facility]) CM/SW [place], RN Phone Number: 01/10/2020, 3:07 PM  Clinical Narrative:     Attempted to reach the daughter to discuss DC plan and review bed offers, the daughter doe snot answer the phone and the VM is full, cant leave a message, The daughter has not been to see the patient today  Expected Discharge Plan: Skilled Nursing Facility Barriers to Discharge: Barriers Resolved  Expected Discharge Plan and Services Expected Discharge Plan: [place]   Discharge Planning Services: CM Consult   Living arrangements for the past 2 months: Single Family Home                 DME Arranged: N/A         HH Arranged: NA           Social Determinants of Health (SDOH) Interventions    Readmission Risk Interventions No flowsheet data found.
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
## 2020-01-10 NOTE — TOC Progression Note (Signed)
Transition of Care [facility]) - Progression Note    Patient Details  Name: Angel Brandt MRN: [id number] Date of Birth: 11-08-1923  Transition of Care [facility]) CM/SW [place], RN Phone Number: 01/10/2020, 9:42 AM  Clinical Narrative:     Attempted to reach the daughter [name] to notfiy her that [facility] is not able to offer a bed to her mother, I wanted to review the bed offers in place, the Daughter did not answer the phone, I am unable to leave a message due to the mail box being full, will call back in a while  Expected Discharge Plan: Skilled Nursing Facility Barriers to Discharge: Barriers Resolved  Expected Discharge Plan and Services Expected Discharge Plan: [facility]   Discharge Planning Services: CM Consult   Living arrangements for the past 2 months: Single Family Home                 DME Arranged: N/A         HH Arranged: NA           Social Determinants of Health (SDOH) Interventions    Readmission Risk Interventions No flowsheet data found.

## 2020-01-10 NOTE — Progress Notes (Signed)
1 Day Post-Op   Subjective/Chief Complaint: Patient seen.  Upon questioning she was able to verbalize that she had pain.   Objective: Vital signs in last 24 hours: Temp:  [97.6 F (36.4 C)-98 F (36.7 C)] 97.9 F (36.6 C) (03/19 0437) Pulse Rate:  [66-82] 66 (03/19 0437) Resp:  [13-20] 16 (03/19 0437) BP: (115-159)/(35-83) 121/35 (03/19 0437) SpO2:  [98 %-100 %] 100 % (03/19 0437)    Intake/Output from previous day: 03/18 0701 - 03/19 0700 In: 600 [I.V.:450; IV Piggyback:150] Out: 353 [Urine:350; Blood:3] Intake/Output this shift: No intake/output data recorded.  The bandage on the foot is dry and intact.  No evidence of any strikethrough.  Lab Results:  Recent Labs    01/08/20 0545 01/10/20 0450  WBC 8.7 4.7  HGB 10.1* 8.4*  HCT 30.4* 25.8*  PLT 368 257   BMET Recent Labs    01/08/20 0545 01/08/20 0545 01/09/20 0747 01/10/20 0450  NA 128*  --   --  133*  K 3.8  --   --  5.0  CL 94*  --   --  105  CO2 21*  --   --  23  GLUCOSE 154*  --   --  109*  BUN 34*  --   --  23  CREATININE 1.21*   < > 0.80 0.84  CALCIUM 8.9  --   --  8.5*   < > = values in this interval not displayed.   PT/INR Recent Labs    01/08/20 0546  LABPROT 14.9  INR 1.2   ABG No results for input(s): PHART, HCO3 in the last 72 hours.  Invalid input(s): PCO2, PO2  Studies/Results: PERIPHERAL VASCULAR CATHETERIZATION  Result Date: 01/08/2020 See op note   Anti-infectives: Anti-infectives (From admission, onward)   Start     Dose/Rate Route Frequency Ordered Stop   01/09/20 2200  ceFEPIme (MAXIPIME) 2 g in sodium chloride 0.9 % 100 mL IVPB     2 g 200 mL/hr over 30 Minutes Intravenous Every 12 hours 01/09/20 0922     01/09/20 1300  metroNIDAZOLE (FLAGYL) IVPB 500 mg     500 mg 100 mL/hr over 60 Minutes Intravenous Every 8 hours 01/09/20 1228     01/09/20 1000  vancomycin (VANCOREADY) IVPB 750 mg/150 mL     750 mg 150 mL/hr over 60 Minutes Intravenous Every 36 hours  01/09/20 0921     01/09/20 0600  ceFEPIme (MAXIPIME) 2 g in sodium chloride 0.9 % 100 mL IVPB  Status:  Discontinued     2 g 200 mL/hr over 30 Minutes Intravenous Every 24 hours 01/08/20 0901 01/09/20 0922   01/09/20 0000  clindamycin (CLEOCIN) IVPB 300 mg    Note to Pharmacy: To be given in specials   300 mg 100 mL/hr over 30 Minutes Intravenous  Once 01/08/20 1122 01/08/20 1324   01/08/20 0905  vancomycin variable dose per unstable renal function (pharmacist dosing)  Status:  Discontinued      Does not apply See admin instructions 01/08/20 0905 01/09/20 0959   01/08/20 0900  vancomycin (VANCOCIN) IVPB 1000 mg/200 mL premix  Status:  Discontinued     1,000 mg 200 mL/hr over 60 Minutes Intravenous Every 24 hours 01/08/20 0850 01/08/20 0948   01/08/20 0545  ceFEPIme (MAXIPIME) 1 g in sodium chloride 0.9 % 100 mL IVPB     1 g 200 mL/hr over 30 Minutes Intravenous  Once 01/08/20 0537 01/08/20 0651   01/08/20 0545  vancomycin (VANCOCIN)  IVPB 1000 mg/200 mL premix     1,000 mg 200 mL/hr over 60 Minutes Intravenous  Once 01/08/20 0537 01/08/20 [id number]      Assessment/Plan: s/p Procedure(s): IRRIGATION AND DEBRIDEMENT FOOT (Right) Assessment: Stable status post I&D   Plan: Dressing left intact.  I will plan for a dressing change tomorrow at some point.  LOS: 2 days    [name] 01/10/2020
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
## 2020-01-10 NOTE — Care Management Important Message (Signed)
Important Message  Patient Details  Name: Angel Brandt MRN: [id number] Date of Birth: 1924-06-21   Medicare Important Message Given:  N/A - LOS <3 / Initial given by admissions     [name] A [name] 01/10/2020, 7:44 AM
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
## 2020-01-11 LAB — BASIC METABOLIC PANEL
Anion gap: 6 (ref 5–15)
BUN: 18 mg/dL (ref 8–23)
CO2: 24 mmol/L (ref 22–32)
Calcium: 8.1 mg/dL — ABNORMAL LOW (ref 8.9–10.3)
Chloride: 104 mmol/L (ref 98–111)
Creatinine, Ser: 0.6 mg/dL (ref 0.44–1.00)
GFR calc Af Amer: >60 mL/min (ref >60)
GFR calc non Af Amer: >60 mL/min (ref >60)
Glucose, Bld: 73 mg/dL (ref 70–99)
Potassium: 4.1 mmol/L (ref 3.5–5.1)
Sodium: 134 mmol/L — ABNORMAL LOW (ref 135–145)

## 2020-01-11 LAB — CBC
HCT: 27.4 % — ABNORMAL LOW (ref 36.0–46.0)
Hemoglobin: 9.4 g/dL — ABNORMAL LOW (ref 12.0–15.0)
MCH: 29.7 pg (ref 26.0–34.0)
MCHC: 34.3 g/dL (ref 30.0–36.0)
MCV: 86.4 fL (ref 80.0–100.0)
Platelets: 224 10*3/uL (ref 150–400)
RBC: 3.17 MIL/uL — ABNORMAL LOW (ref 3.87–5.11)
RDW: 12.7 % (ref 11.5–15.5)
WBC: 5.8 10*3/uL (ref 4.0–10.5)
nRBC: 0 % (ref 0.0–0.2)
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
## 2020-01-11 NOTE — Progress Notes (Signed)
Pt has been confused most of this shift and incontinent and refused a purewick she has taken gown off multiple times and linen. Pt gown and pads changed multiple times this shift.pt is resting comfortably at this time. Will continue to monitor.

## 2020-01-11 NOTE — TOC Progression Note (Signed)
Transition of Care [facility]) - Progression Note    Patient Details  Name: Angel Brandt MRN: [id number] Date of Birth: 09-Apr-1924  Transition of Care [facility]) CM/SW Contact  Boris Sharper, LCSW Phone Number:[phone] 01/11/2020, 10:50 AM  Clinical Narrative:    CSW spoke with pt's daughter to notify her of bed offers. She said that she wanted to think about it. CSW will follow up with her tomorrow about bed choice.   Expected Discharge Plan: Skilled Nursing Facility Barriers to Discharge: Barriers Resolved  Expected Discharge Plan and Services Expected Discharge Plan: [place]   Discharge Planning Services: CM Consult   Living arrangements for the past 2 months: Single Family Home                 DME Arranged: N/A         HH Arranged: NA           Social Determinants of Health (SDOH) Interventions    Readmission Risk Interventions No flowsheet data found.

## 2020-01-11 NOTE — Progress Notes (Signed)
2 Days Post-Op   Subjective/Chief Complaint: Patient seen.  Much more lucid today and able to carry on some conversation.  Does relate that the foot is not hurting quite as bad.   Objective: Vital signs in last 24 hours: Temp:  [97.7 F (36.5 C)-98.8 F (37.1 C)] 97.7 F (36.5 C) (03/20 0952) Pulse Rate:  [72-75] 72 (03/20 0952) Resp:  [19] 19 (03/19 2200) BP: (105-137)/(42-50) 137/47 (03/20 0952) SpO2:  [97 %-100 %] 100 % (03/20 0952) Last BM Date: 01/10/20  Intake/Output from previous day: No intake/output data recorded. Intake/Output this shift: Total I/O In: 3703.9 [I.V.:2604.6; IV Piggyback:1099.4] Out: -   The bandage is dry and intact on the right foot.  Upon removal there is some mild to moderate drainage.  Mepitel and beads intact.  Edema significantly improved in the left foot and leg.  Possibly some mild areas of necrosis noted beneath the Mepitel.       Lab Results:  Recent Labs    01/10/20 0450 01/11/20 0624  WBC 4.7 5.8  HGB 8.4* 9.4*  HCT 25.8* 27.4*  PLT 257 224   BMET Recent Labs    01/10/20 0450 01/11/20 0624  NA 133* 134*  K 5.0 4.1  CL 105 104  CO2 23 24  GLUCOSE 109* 73  BUN 23 18  CREATININE 0.84 0.60  CALCIUM 8.5* 8.1*   PT/INR No results for input(s): LABPROT, INR in the last 72 hours. ABG No results for input(s): PHART, HCO3 in the last 72 hours.  Invalid input(s): PCO2, PO2  Studies/Results: No results found.  Anti-infectives: Anti-infectives (From admission, onward)   Start     Dose/Rate Route Frequency Ordered Stop   01/09/20 2200  ceFEPIme (MAXIPIME) 2 g in sodium chloride 0.9 % 100 mL IVPB     2 g 200 mL/hr over 30 Minutes Intravenous Every 12 hours 01/09/20 0922     01/09/20 1300  metroNIDAZOLE (FLAGYL) IVPB 500 mg     500 mg 100 mL/hr over 60 Minutes Intravenous Every 8 hours 01/09/20 1228     01/09/20 1000  vancomycin (VANCOREADY) IVPB 750 mg/150 mL     750 mg 150 mL/hr over 60 Minutes Intravenous Every 36  hours 01/09/20 0921     01/09/20 0600  ceFEPIme (MAXIPIME) 2 g in sodium chloride 0.9 % 100 mL IVPB  Status:  Discontinued     2 g 200 mL/hr over 30 Minutes Intravenous Every 24 hours 01/08/20 0901 01/09/20 0922   01/09/20 0000  clindamycin (CLEOCIN) IVPB 300 mg    Note to Pharmacy: To be given in specials   300 mg 100 mL/hr over 30 Minutes Intravenous  Once 01/08/20 1122 01/08/20 1324   01/08/20 0905  vancomycin variable dose per unstable renal function (pharmacist dosing)  Status:  Discontinued      Does not apply See admin instructions 01/08/20 0905 01/09/20 0959   01/08/20 0900  vancomycin (VANCOCIN) IVPB 1000 mg/200 mL premix  Status:  Discontinued     1,000 mg 200 mL/hr over 60 Minutes Intravenous Every 24 hours 01/08/20 0850 01/08/20 0948   01/08/20 0545  ceFEPIme (MAXIPIME) 1 g in sodium chloride 0.9 % 100 mL IVPB     1 g 200 mL/hr over 30 Minutes Intravenous  Once 01/08/20 0537 01/08/20 0651   01/08/20 0545  vancomycin (VANCOCIN) IVPB 1000 mg/200 mL premix     1,000 mg 200 mL/hr over 60 Minutes Intravenous  Once 01/08/20 0537 01/08/20 [id number]  Assessment/Plan: s/p Procedure(s): IRRIGATION AND DEBRIDEMENT FOOT (Right) Assessment: Stable status post debridement right foot.   Plan: Xeroform applied to the ulceration on the medial aspect of the foot and joint area followed by a bulky gauze bandage.  Patient will need dressing changes every few days.  We will continue to follow her periodically if she remains in the hospital.  Otherwise follow-up in a couple of weeks outpatient  LOS: 3 days    [name] 01/11/2020

## 2020-01-12 LAB — AEROBIC/ANAEROBIC CULTURE W GRAM STAIN (SURGICAL/DEEP WOUND): Gram Stain: NONE SEEN

## 2020-01-12 LAB — CREATININE, SERUM
Creatinine, Ser: 0.63 mg/dL (ref 0.44–1.00)
GFR calc Af Amer: >60 mL/min (ref >60)
GFR calc non Af Amer: >60 mL/min (ref >60)

## 2020-01-12 NOTE — TOC Progression Note (Signed)
Transition of Care [facility]) - Progression Note    Patient Details  Name: Angel Brandt MRN: [id number] Date of Birth: 1924/07/07  Transition of Care [facility]) CM/SW Contact  Boris Sharper, LCSW Phone Number:[phone] 01/12/2020, 4:30 PM  Clinical Narrative:    MD requested that [facility] look into Hospice Home facility for pt. Doctor Manuella Ghazi stated that she is not doing well and he thinks she need to go to Hospice. CSW reached out to pt's daughter and left a message to notify her of this update. She has not called back at this time. CSW reached out to [place] with [facility] for referral and left message, she has returned the call at this time.  TOC will continue to follow for discharge planning needs.   Expected Discharge Plan: Skilled Nursing Facility Barriers to Discharge: Barriers Resolved  Expected Discharge Plan and Services Expected Discharge Plan: [place]   Discharge Planning Services: CM Consult   Living arrangements for the past 2 months: Single Family Home                 DME Arranged: N/A         HH Arranged: NA           Social Determinants of Health (SDOH) Interventions    Readmission Risk Interventions No flowsheet data found.
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
## 2020-01-13 LAB — CULTURE, BLOOD (ROUTINE X 2): Culture: NO GROWTH

## 2020-01-13 MED ORDER — GLYCOPYRROLATE 1 MG PO TABS
1.0000 mg | ORAL_TABLET | ORAL | Status: DC | PRN
  Filled 2020-01-13: qty 1

## 2020-01-13 MED ORDER — LINEZOLID 600 MG PO TABS
600.0000 mg | ORAL_TABLET | Freq: Two times a day (BID) | ORAL | Status: DC
  Filled 2020-01-13: qty 1

## 2020-01-13 MED ORDER — GLYCOPYRROLATE 0.2 MG/ML IJ SOLN
0.2000 mg | INTRAMUSCULAR | Status: DC | PRN
  Filled 2020-01-13: qty 1

## 2020-01-13 MED ORDER — MIRTAZAPINE 15 MG PO TABS
30.0000 mg | ORAL_TABLET | Freq: Every day | ORAL | Status: DC
  Administered 2020-01-13: 30 mg via ORAL
  Filled 2020-01-13: qty 2

## 2020-01-13 MED ORDER — POLYVINYL ALCOHOL 1.4 % OP SOLN
1.0000 [drp] | Freq: Four times a day (QID) | OPHTHALMIC | Status: DC | PRN
  Filled 2020-01-13: qty 15
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
## 2020-01-13 NOTE — TOC Progression Note (Signed)
Transition of Care (TOC) - Progression Note    Patient Details  Name: Angel Brandt MRN: [id number] Date of Birth: 04-11-1924  Transition of Care [facility]) CM/SW [place], RN Phone Number: 01/13/2020, 10:46 AM  Clinical Narrative:    Spoke with the patient's daughter [name], she verified that she wants the patient to go home with Hospice and wants [facility], I notified Santiago Glad, she stated that she has everything she needs except a bed.  She will need a bed.   Expected Discharge Plan: Skilled Nursing Facility Barriers to Discharge: Barriers Resolved  Expected Discharge Plan and Services Expected Discharge Plan: [place]   Discharge Planning Services: CM Consult   Living arrangements for the past 2 months: Single Family Home                 DME Arranged: N/A         HH Arranged: NA           Social Determinants of Health (SDOH) Interventions    Readmission Risk Interventions No flowsheet data found.

## 2020-01-13 NOTE — Care Management Important Message (Signed)
Important Message  Patient Details  Name: Angel Brandt MRN: [id number] Date of Birth: [birth year]   Medicare Important Message Given:  Fernande Boyden, RN 01/13/2020, 10:59 AM
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
## 2020-01-13 NOTE — Progress Notes (Signed)
New referral for [facility] hosp[facility] services at home received from [facility].  Writer met in the room with patient and her daughter Angel Brandt to initiate education regarding hospice services, philosophy and team approach to care with understanding voiced.  Questions answered. DME needs discussed, patient will require a hospital bed. Melinda requested delivery tomorrow as she needs to rearrange furniture. Hospital care team updated. Patient information given to referral. Will continue to follow thorough discharge.  Please send signed out of facility DNR with patient at discharge. Flo Shanks BSN, RN, [place] [phone]

## 2020-01-14 ENCOUNTER — Encounter: Payer: Self-pay | Admitting: Internal Medicine

## 2020-01-14 DIAGNOSIS — N179 Acute kidney failure, unspecified: Secondary | ICD-10-CM

## 2020-01-14 LAB — CULTURE, BLOOD (ROUTINE X 2)
Culture: NO GROWTH
Culture: NO GROWTH
Special Requests: ADEQUATE

## 2020-01-14 MED ORDER — GLYCOPYRROLATE 1 MG PO TABS: 1.0000 mg | ORAL_TABLET | ORAL | Status: AC | PRN

## 2020-01-14 NOTE — Progress Notes (Signed)
Second attempt to visit, patient asleep.

## 2020-01-14 NOTE — Progress Notes (Signed)
Follow up visit made to new referral for [facility] hospice services at home. Patient seen, lying in bed, daughter [name] at bedside, patient c/o pain "all over". Discussed pain medications with [name], she prefers acetaminophen, staff RN [facility] made aware.  Plan is for discharge home today, bed has been delivered per [facility].  [place] notified. Will continue to follow through discharge. Flo Shanks BSN, RN, [place] [phone]

## 2020-01-14 NOTE — TOC Progression Note (Signed)
Transition of Care [facility]) - Progression Note    Patient Details  Name: Angel Brandt MRN: [id number] Date of Birth: 07/10/1924  [facility] CM/SW Contact  Su Hilt, RN Phone Number: 01/14/2020, 8:57 AM  Clinical Narrative:     Sent Message to Santiago Glad with [facility] inquiring when the Hospital bed will be delivered. Notified her that there is a DC order in Place, [place] responce  Expected Discharge Plan: Skilled Nursing Facility Barriers to Discharge: Barriers Resolved  Expected Discharge Plan and Services Expected Discharge Plan: [place]   Discharge Planning Services: CM Consult   Living arrangements for the past 2 months: Single Family Home Expected Discharge Date: 01/14/20               DME Arranged: N/A         HH Arranged: NA           Social Determinants of Health (SDOH) Interventions    Readmission Risk Interventions No flowsheet data found.

## 2020-01-14 NOTE — TOC Transition Note (Signed)
Transition of Care [facility]) - CM/SW Discharge Note   Patient Details  Name: Angel Brandt MRN: [id number] Date of Birth: 02-19-1924  Transition of Care [facility]) CM/SW Contact:  Su Hilt, RN Phone Number: 01/14/2020, 12:15 PM   Clinical Narrative:     The patient to discharge home with [facility] The DC packet is on the chart including DNR papers The bedside nurse is aware of the DC and will review the DC papers with the daugher who is in the room, RNCM called EMS to transport    Barriers to Discharge: Barriers Resolved   Patient Goals and CMS Choice Patient states their goals for this hospitalization and ongoing recovery are:: go to rehab      Discharge Placement                       Discharge Plan and Services   Discharge Planning Services: CM Consult            DME Arranged: N/A         HH Arranged: NA          Social Determinants of Health (SDOH) Interventions     Readmission Risk Interventions No flowsheet data found.

## 2020-01-14 NOTE — Progress Notes (Signed)
   01/14/20 1400  Clinical Encounter Type  Visited With Patient and family together  Visit Type Initial  Referral From Nurse  Consult/Referral To Chaplain  Chaplain briefly visited with patient, Ms. Citron and daughter, [name]. [name] explained that patient is being discharged today and she is scheduled to meet with Hospice. Melinda's friend has been sitting and waiting in the car since 11. Chaplain checked with nurses to see how many visitors are allowed and for comfort care it is 4, therefore the friend can come in. Nurses came in to work on Ms. [place], therefore chaplain left after offering pastoral presence and empathy.
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
## 2020-01-14 NOTE — Progress Notes (Addendum)
D: Pt alert and oriented x1 to self. Pt denies experiencing any pain at this time.   A: Pt and daughter received discharge and medication education/information. Pt belongings were gathered and taken with pt upon discharge.   R: Pt and daughter verbalized understanding of discharge and medication education/information.  Pt transported via EMS home.

## 2020-01-15 NOTE — Discharge Summary (Signed)
[name] NAME: Angel Brandt    MR#:  [id number]  DATE OF BIRTH:  [birth year]  DATE OF ADMISSION:  01/08/2020   ADMITTING PHYSICIAN: Collier Bullock, MD  DATE OF DISCHARGE: 01/14/2020  6:49 PM  PRIMARY CARE PHYSICIAN: Maryland Pink, MD   ADMISSION DIAGNOSIS:  Hyponatremia [E87.1] AKI (acute kidney injury) ([place]) [N17.9] Second degree burn of foot, right, sequela [id number] Complicated wound infection [T14.8XXA, L08.9] Sepsis with acute renal failure without septic shock, due to unspecified organism, unspecified acute renal failure type ([place]) [A41.9, R65.20, N17.9] DISCHARGE DIAGNOSIS:  Principal Problem:   Second degree burn of foot, right, sequela Active Problems:   Protein-calorie malnutrition, severe   Dehydration   Hyponatremia   Anemia of chronic disease   Pressure injury of skin   Palliative care by specialist   Goals of care, counseling/discussion   Complicated wound infection   Dementia without behavioral disturbance (HCC)   AKI (acute kidney injury) ([place])  SECONDARY DIAGNOSIS:   Past Medical History:  Diagnosis Date  . At high risk for falls   . Blind   . Breast cancer ([place])   . Cancer ([place])   . Sherran Needs syndrome 2017  . GERD (gastroesophageal reflux disease)   . Hearing impaired   . Macular degeneration   . Malaria   . Osteoporosis    HOSPITAL COURSE:  86 y.o.femalewith a history of dementia, macular degeneration and legally blind, breast cancer, TIA, hearing impaired admitted from home fordizziness.   Second-degree burn of right footwithsuperimposed infection and cellulitis and cellulitisand Cellulitis - s/p angio on 3/17 - wnl - s/p I & D by podiatry on 3/18  Dehydration Hyponatremia Anemia of chronic disease Protein calorie malnutrition, Severe Severe Dementia  Pressure Injury 01/08/20 Coccyx Right;Posterior Stage 2 -  Partial thickness loss of dermis presenting as a shallow open injury with a  red, pink wound bed without slough. No active bleeding, preventative measures in place.  (Active)  01/08/20 0935  Location: Coccyx  Location Orientation: Right;Posterior  Staging: Stage 2 -  Partial thickness loss of dermis presenting as a shallow open injury with a red, pink wound bed without slough.  Wound Description (Comments): No active bleeding, preventative measures in place.   Present on Admission: Yes     Pressure Injury 01/08/20 Groin Other (Comment) Stage 2 -  Partial thickness loss of dermis presenting as a shallow open injury with a red, pink wound bed without slough. Open sores, stage 2 throughout groin, coccyc (Active)  01/08/20 0935  Location: Groin  Location Orientation: Other (Comment)  Staging: Stage 2 -  Partial thickness loss of dermis presenting as a shallow open injury with a red, pink wound bed without slough.  Wound Description (Comments): Open sores, stage 2 throughout groin, coccyc  Present on Admission: Yes     Pressure Injury 01/08/20 Hip Left Stage 2 -  Partial thickness loss of dermis presenting as a shallow open injury with a red, pink wound bed without slough. (Active)  01/08/20 0935  Location: Hip  Location Orientation: Left  Staging: Stage 2 -  Partial thickness loss of dermis presenting as a shallow open injury with a red, pink wound bed without slough.  Wound Description (Comments):   Present on Admission: Yes   DISCHARGE CONDITIONS:  poor CONSULTS OBTAINED:   DRUG ALLERGIES:   Allergies  Allergen Reactions  . Codeine Anaphylaxis and Other (See Comments)  . Penicillins Anaphylaxis and Nausea And Vomiting  Other reaction(s): Unknown  . Aspirin Nausea And Vomiting and Other (See Comments)    unknown  . Ativan [Lorazepam] Other (See Comments)    Agitation  . Codeine Sulfate Nausea And Vomiting  . Flu Virus Vaccine     Other reaction(s): Unknown  . Other Other (See Comments)  . Sulfa Antibiotics Other (See Comments) and Nausea And Vomiting     unknown  . Tetanus-Diphth-Acell Pertussis     Other reaction(s): Unknown   DISCHARGE MEDICATIONS:   Allergies as of 01/14/2020      Reactions   Codeine Anaphylaxis, Other (See Comments)   Penicillins Anaphylaxis, Nausea And Vomiting   Other reaction(s): Unknown   Aspirin Nausea And Vomiting, Other (See Comments)   unknown   Ativan [lorazepam] Other (See Comments)   Agitation   Codeine Sulfate Nausea And Vomiting   Flu Virus Vaccine    Other reaction(s): Unknown   Other Other (See Comments)   Sulfa Antibiotics Other (See Comments), Nausea And Vomiting   unknown   Tetanus-diphth-acell Pertussis    Other reaction(s): Unknown      Medication List    STOP taking these medications   acetaminophen 500 MG tablet Commonly known as: TYLENOL   aspirin 81 MG EC tablet   docusate sodium 100 MG capsule Commonly known as: COLACE   enoxaparin 40 MG/0.4ML injection Commonly known as: LOVENOX   feeding supplement (ENSURE ENLIVE) Liqd   ferrous sulfate 325 (65 FE) MG tablet   HYDROcodone-acetaminophen 5-325 MG tablet Commonly known as: NORCO/VICODIN   mirtazapine 45 MG tablet Commonly known as: REMERON   multivitamin with minerals Tabs tablet   omeprazole 20 MG capsule Commonly known as: PRILOSEC   polyethylene glycol 17 g packet Commonly known as: MIRALAX / GLYCOLAX   QUEtiapine 25 MG tablet Commonly known as: SEROQUEL   traMADol 50 MG tablet Commonly known as: ULTRAM     TAKE these medications   glycopyrrolate 1 MG tablet Commonly known as: ROBINUL Take 1 tablet (1 mg total) by mouth every 4 (four) hours as needed (excessive secretions).      DISCHARGE INSTRUCTIONS:   DIET:  Regular diet DISCHARGE CONDITION:  Fair ACTIVITY:  Activity as tolerated OXYGEN:  Home Oxygen: No.  Oxygen Delivery: room air DISCHARGE LOCATION:  home with Hospice  If you experience worsening of your admission symptoms, develop shortness of breath, life threatening  emergency, suicidal or homicidal thoughts you must seek medical attention immediately by calling 911 or calling your MD immediately  if symptoms less severe.  You Must read complete instructions/literature along with all the possible adverse reactions/side effects for all the Medicines you take and that have been prescribed to you. Take any new Medicines after you have completely understood and accpet all the possible adverse reactions/side effects.   Please note  You were cared for by a hospitalist during your hospital stay. If you have any questions about your discharge medications or the care you received while you were in the hospital after you are discharged, you can call the unit and asked to speak with the hospitalist on call if the hospitalist that took care of you is not available. Once you are discharged, your primary care physician will handle any further medical issues. Please note that NO REFILLS for any discharge medications will be authorized once you are discharged, as it is imperative that you return to your primary care physician (or establish a relationship with a primary care physician if you do not have one) for  your aftercare needs so that they can reassess your need for medications and monitor your lab values.    On the day of Discharge:  VITAL SIGNS:  Blood pressure (!) 118/44, pulse 65, temperature 98.4 F (36.9 C), temperature source Axillary, resp. rate 16, height 5\' 3"  (1.6 m), weight 47.8 kg, SpO2 99 %. PHYSICAL EXAMINATION:  GENERAL:  86 y.o.-year-old patient lying in the bed with no acute distress.  EYES: Pupils equal, round, reactive to light and accommodation. No scleral icterus. Extraocular muscles intact.  HEENT: Head atraumatic, normocephalic. Oropharynx and nasopharynx clear.  NECK:  Supple, no jugular venous distention. No thyroid enlargement, no tenderness.  LUNGS: Normal breath sounds bilaterally, no wheezing, rales,rhonchi or crepitation. No use of  accessory muscles of respiration.  CARDIOVASCULAR: S1, S2 normal. No murmurs, rubs, or gallops.  ABDOMEN: Soft, non-tender, non-distended. Bowel sounds present. No organomegaly or mass.  EXTREMITIES: No pedal edema, cyanosis, or clubbing.  NEUROLOGIC: Cranial nerves II through XII are intact. Muscle strength 5/5 in all extremities. Sensation intact. Gait not checked.  PSYCHIATRIC: The patient is alert and oriented x 3.  SKIN: No obvious rash, lesion, or ulcer.  DATA REVIEW:   CBC Recent Labs  Lab 01/11/20 0624  WBC 5.8  HGB 9.4*  HCT 27.4*  PLT 224    Chemistries  Recent Labs  Lab 01/11/20 0624 01/11/20 0624 01/12/20 0556  NA 134*  --   --   K 4.1  --   --   CL 104  --   --   CO2 24  --   --   GLUCOSE 73  --   --   BUN 18  --   --   CREATININE 0.60   < > 0.63  CALCIUM 8.1*  --   --    < > = values in this interval not displayed.     Outpatient follow-up    Management plans discussed with the patient, family and they are in agreement.  CODE STATUS: Prior   TOTAL TIME TAKING CARE OF THIS PATIENT: 45 minutes.    Max Sane M.D on 01/15/2020 at 10:22 AM  [facility]   CC: Primary care physician; Maryland Pink, MD   Note: This dictation was prepared with Dragon dictation along with smaller phrase technology. Any transcriptional errors that result from this process are unintentional.

## 2020-03-03 ENCOUNTER — Telehealth: Payer: Self-pay | Admitting: Internal Medicine

## 2020-03-03 NOTE — Telephone Encounter (Signed)
I connected by phone with Angel Brandt and/or patient's caregiver on 03/03/2020 at 3:30 PM to discuss the potential vaccination through our Homebound vaccination initiative. I spoke with pt's daughter, Angel Brandt.   Prevaccination Checklist for COVID-19 Vaccines  1.  Are you feeling sick today? no  2.  Have you ever received a dose of a COVID-19 vaccine?  no      If yes, which one? None   3.  Have you ever had an allergic reaction: (This would include a severe reaction [ e.g., anaphylaxis] that required treatment with epinephrine or EpiPen or that caused you to go to the hospital.  It would also include an allergic reaction that occurred within 4 hours that caused hives, swelling, or respiratory distress, including wheezing.) A.  A previous dose of COVID-19 vaccine. no  B.  A vaccine or injectable therapy that contains multiple components, one of which is a COVID-19 vaccine component, but it is not known which component elicited the immediate reaction.  no C.  Are you allergic to polyethylene glycol? no   4.  Have you ever had an allergic reaction to another vaccine (other than COVID-19 vaccine) or an injectable medication? (This would include a severe reaction [ e.g., anaphylaxis] that required treatment with epinephrine or EpiPen or that caused you to go to the hospital.  It would also include an allergic reaction that occurred within 4 hours that caused hives, swelling, or respiratory distress, including wheezing.)  Daughter states pt had allergic reaction to flu vaccine several years ago that she remembers her PCP calling "severe" though she does not know what type of reaction occurred. Pt c memory loss and is unaware. Daughter still wished to proceed c vaccine. SItuation d/w Dr Joya Gaskins. Agreed 81min wait after vaccine is sufficient.  5.  Have you ever had a severe allergic reaction (e.g., anaphylaxis) to something other than a component of the COVID-19 vaccine, or any vaccine or injectable  medication?  This would include food, pet, venom, environmental, or oral medication allergies.  See above  6.  Have you received any vaccine in the last 14 days? no   7.  Have you ever had a positive test for COVID-19 or has a doctor ever told you that you had COVID-19?  no   8.  Have you received passive antibody therapy (monoclonal antibodies or convalescent serum) as a treatment for COVID-19? yes   9.  Do you have a weakened immune system caused by something such as HIV infection or cancer or do you take immunosuppressive drugs or therapies?  no   10.  Do you have a bleeding disorder or are you taking a blood thinner? no   11.  Are you pregnant or breast-feeding? no   12.  Do you have dermal fillers? no   __________________   This patient is a 86 y.o. female that meets the FDA criteria to receive homebound vaccination. Patient or parent/caregiver understands they have the option to accept or refuse homebound vaccination.  Patient passed the pre-screening checklist and would like to proceed with homebound vaccination.  Based on questionnaire above, I recommend the patient be observed for 30 minutes.  There are an estimated 0 of other household members/caregivers who are also interested in receiving the vaccine.     I will send the patient's information to our scheduling team who will reach out to schedule the patient and potential caregiver/family members for homebound vaccination. Please contact pt's daughter, Angel Brandt, on number listed in chart  to schedule vaccine.   Angel Ripper, NP-C [place]

## 2020-03-25 ENCOUNTER — Ambulatory Visit: Attending: Critical Care Medicine

## 2020-03-25 DIAGNOSIS — Z23 Encounter for immunization: Secondary | ICD-10-CM

## 2020-03-25 NOTE — Progress Notes (Signed)
   Covid-19 Vaccination Clinic  Name:  Angel Brandt    MRN: [id number] DOB: [birth year]  03/25/2020  [name] was observed post Covid-19 immunization for 15 minutes without incident. She was provided with Vaccine Information Sheet and instruction to access the V-Safe system.   [name] was instructed to call 911 with any severe reactions post vaccine: Marland Kitchen Difficulty breathing  . Swelling of face and throat  . A fast heartbeat  . A bad rash all over body  . Dizziness and weakness   Immunizations Administered    Name Date Dose VIS Date Route   Moderna COVID-19 Vaccine 03/25/2020 12:20 PM 0.5 mL 09/2019 Intramuscular   Manufacturer: Moderna   Lot: [id number]   [id number]

## 2020-04-22 ENCOUNTER — Ambulatory Visit: Payer: Self-pay | Attending: Internal Medicine

## 2020-04-22 DIAGNOSIS — Z23 Encounter for immunization: Secondary | ICD-10-CM

## 2020-04-22 NOTE — Progress Notes (Signed)
   Covid-19 Vaccination Clinic  Name:  Angel Brandt    MRN: [id number] DOB: 1924-09-10  04/22/2020  Angel Brandt was observed post Covid-19 immunization for 15 minutes without incident. She was provided with Vaccine Information Sheet and instruction to access the V-Safe system.   Angel Brandt was instructed to call 911 with any severe reactions post vaccine: Marland Kitchen Difficulty breathing  . Swelling of face and throat  . A fast heartbeat  . A bad rash all over body  . Dizziness and weakness   Immunizations Administered    Name Date Dose VIS Date Route   Moderna COVID-19 Vaccine 04/22/2020 11:10 AM 0.5 mL 09/2019 Intramuscular   Manufacturer: Moderna   Lot: [id number]   [place]: [phone]

## 2021-09-23 ENCOUNTER — Emergency Department

## 2021-09-23 ENCOUNTER — Other Ambulatory Visit: Payer: Self-pay

## 2021-09-23 ENCOUNTER — Inpatient Hospital Stay
Admission: EM | Admit: 2021-09-23 | Discharge: 2021-09-25 | DRG: 521 | Disposition: A | Discharge status: Hospice | Attending: Internal Medicine | Admitting: Internal Medicine

## 2021-09-23 ENCOUNTER — Inpatient Hospital Stay: Admitting: Certified Registered"

## 2021-09-23 ENCOUNTER — Encounter: Payer: Self-pay | Admitting: Emergency Medicine

## 2021-09-23 ENCOUNTER — Inpatient Hospital Stay

## 2021-09-23 ENCOUNTER — Encounter
Admission: EM | Disposition: A | Discharge status: Hospice | Payer: Self-pay | Source: Home / Self Care | Attending: Internal Medicine

## 2021-09-23 DIAGNOSIS — R296 Repeated falls: Secondary | ICD-10-CM | POA: Diagnosis present

## 2021-09-23 DIAGNOSIS — Z885 Allergy status to narcotic agent status: Secondary | ICD-10-CM

## 2021-09-23 DIAGNOSIS — W19XXXA Unspecified fall, initial encounter: Secondary | ICD-10-CM | POA: Diagnosis present

## 2021-09-23 DIAGNOSIS — H548 Legal blindness, as defined in USA: Secondary | ICD-10-CM | POA: Diagnosis present

## 2021-09-23 DIAGNOSIS — Z882 Allergy status to sulfonamides status: Secondary | ICD-10-CM

## 2021-09-23 DIAGNOSIS — M810 Age-related osteoporosis without current pathological fracture: Secondary | ICD-10-CM | POA: Diagnosis present

## 2021-09-23 DIAGNOSIS — S42212A Unspecified displaced fracture of surgical neck of left humerus, initial encounter for closed fracture: Secondary | ICD-10-CM

## 2021-09-23 DIAGNOSIS — K219 Gastro-esophageal reflux disease without esophagitis: Secondary | ICD-10-CM | POA: Diagnosis present

## 2021-09-23 DIAGNOSIS — H3530 Unspecified macular degeneration: Secondary | ICD-10-CM | POA: Diagnosis present

## 2021-09-23 DIAGNOSIS — D62 Acute posthemorrhagic anemia: Secondary | ICD-10-CM | POA: Diagnosis not present

## 2021-09-23 DIAGNOSIS — G8918 Other acute postprocedural pain: Secondary | ICD-10-CM

## 2021-09-23 DIAGNOSIS — Z419 Encounter for procedure for purposes other than remedying health state, unspecified: Secondary | ICD-10-CM

## 2021-09-23 DIAGNOSIS — Z515 Encounter for palliative care: Secondary | ICD-10-CM | POA: Diagnosis not present

## 2021-09-23 DIAGNOSIS — Z79899 Other long term (current) drug therapy: Secondary | ICD-10-CM | POA: Diagnosis not present

## 2021-09-23 DIAGNOSIS — Z887 Allergy status to serum and vaccine status: Secondary | ICD-10-CM

## 2021-09-23 DIAGNOSIS — Z880 Allergy status to penicillin: Secondary | ICD-10-CM | POA: Diagnosis not present

## 2021-09-23 DIAGNOSIS — F0390 Unspecified dementia without behavioral disturbance: Secondary | ICD-10-CM | POA: Diagnosis present

## 2021-09-23 DIAGNOSIS — H9190 Unspecified hearing loss, unspecified ear: Secondary | ICD-10-CM | POA: Diagnosis present

## 2021-09-23 DIAGNOSIS — Z681 Body mass index (BMI) 19 or less, adult: Secondary | ICD-10-CM

## 2021-09-23 DIAGNOSIS — D638 Anemia in other chronic diseases classified elsewhere: Secondary | ICD-10-CM | POA: Diagnosis present

## 2021-09-23 DIAGNOSIS — Z853 Personal history of malignant neoplasm of breast: Secondary | ICD-10-CM | POA: Diagnosis not present

## 2021-09-23 DIAGNOSIS — F17220 Nicotine dependence, chewing tobacco, uncomplicated: Secondary | ICD-10-CM | POA: Diagnosis present

## 2021-09-23 DIAGNOSIS — S72002A Fracture of unspecified part of neck of left femur, initial encounter for closed fracture: Principal | ICD-10-CM | POA: Diagnosis present

## 2021-09-23 DIAGNOSIS — Z20822 Contact with and (suspected) exposure to covid-19: Secondary | ICD-10-CM | POA: Diagnosis present

## 2021-09-23 DIAGNOSIS — F03C0 Unspecified dementia, severe, without behavioral disturbance, psychotic disturbance, mood disturbance, and anxiety: Secondary | ICD-10-CM | POA: Diagnosis present

## 2021-09-23 DIAGNOSIS — Z66 Do not resuscitate: Secondary | ICD-10-CM | POA: Diagnosis present

## 2021-09-23 DIAGNOSIS — Z886 Allergy status to analgesic agent status: Secondary | ICD-10-CM

## 2021-09-23 DIAGNOSIS — E43 Unspecified severe protein-calorie malnutrition: Secondary | ICD-10-CM | POA: Diagnosis present

## 2021-09-23 HISTORY — PX: HIP ARTHROPLASTY: SHX981

## 2021-09-23 LAB — URINALYSIS, MICROSCOPIC (REFLEX)

## 2021-09-23 LAB — URINALYSIS, ROUTINE W REFLEX MICROSCOPIC
Bilirubin Urine: NEGATIVE
Glucose, UA: NEGATIVE mg/dL
Hgb urine dipstick: NEGATIVE
Ketones, ur: NEGATIVE mg/dL
Leukocytes,Ua: NEGATIVE
Nitrite: POSITIVE — AB
Protein, ur: NEGATIVE mg/dL
Specific Gravity, Urine: 1.02 (ref 1.005–1.030)
pH: 6.5 (ref 5.0–8.0)

## 2021-09-23 LAB — BASIC METABOLIC PANEL
Anion gap: 7 (ref 5–15)
BUN: 22 mg/dL (ref 8–23)
CO2: 25 mmol/L (ref 22–32)
Calcium: 8.5 mg/dL — ABNORMAL LOW (ref 8.9–10.3)
Chloride: 100 mmol/L (ref 98–111)
Creatinine, Ser: 0.78 mg/dL (ref 0.44–1.00)
GFR, Estimated: >60 mL/min (ref >60)
Glucose, Bld: 104 mg/dL — ABNORMAL HIGH (ref 70–99)
Potassium: 3.8 mmol/L (ref 3.5–5.1)
Sodium: 132 mmol/L — ABNORMAL LOW (ref 135–145)

## 2021-09-23 LAB — CBC WITH DIFFERENTIAL/PLATELET
Abs Immature Granulocytes: 0.07 10*3/uL (ref 0.00–0.07)
Basophils Absolute: 0.1 10*3/uL (ref 0.0–0.1)
Basophils Relative: 0 %
Eosinophils Absolute: 0 10*3/uL (ref 0.0–0.5)
Eosinophils Relative: 0 %
HCT: 29.8 % — ABNORMAL LOW (ref 36.0–46.0)
Hemoglobin: 9.8 g/dL — ABNORMAL LOW (ref 12.0–15.0)
Immature Granulocytes: 1 %
Lymphocytes Relative: 6 %
Lymphs Abs: 0.7 10*3/uL (ref 0.7–4.0)
MCH: 29.8 pg (ref 26.0–34.0)
MCHC: 32.9 g/dL (ref 30.0–36.0)
MCV: 90.6 fL (ref 80.0–100.0)
Monocytes Absolute: 0.6 10*3/uL (ref 0.1–1.0)
Monocytes Relative: 4 %
Neutro Abs: 11.2 10*3/uL — ABNORMAL HIGH (ref 1.7–7.7)
Neutrophils Relative %: 89 %
Platelets: 236 10*3/uL (ref 150–400)
RBC: 3.29 MIL/uL — ABNORMAL LOW (ref 3.87–5.11)
RDW: 13.5 % (ref 11.5–15.5)
WBC: 12.6 10*3/uL — ABNORMAL HIGH (ref 4.0–10.5)
nRBC: 0 % (ref 0.0–0.2)

## 2021-09-23 LAB — RESP PANEL BY RT-PCR (FLU A&B, COVID) ARPGX2
Influenza A by PCR: NEGATIVE
Influenza B by PCR: NEGATIVE
SARS Coronavirus 2 by RT PCR: NEGATIVE

## 2021-09-23 SURGERY — HEMIARTHROPLASTY, HIP, DIRECT ANTERIOR APPROACH, FOR FRACTURE
Anesthesia: Spinal | Site: Hip | Laterality: Left

## 2021-09-23 MED ORDER — BUPIVACAINE HCL (PF) 0.5 % IJ SOLN
INTRAMUSCULAR | Status: DC | PRN
  Administered 2021-09-23: 2 mL via INTRATHECAL

## 2021-09-23 MED ORDER — QUETIAPINE FUMARATE 25 MG PO TABS
50.0000 mg | ORAL_TABLET | Freq: Every evening | ORAL | Status: DC
  Administered 2021-09-23: 50 mg via ORAL
  Filled 2021-09-23: qty 2

## 2021-09-23 MED ORDER — METHOCARBAMOL 1000 MG/10ML IJ SOLN
500.0000 mg | Freq: Four times a day (QID) | INTRAVENOUS | Status: DC | PRN
  Filled 2021-09-23: qty 5

## 2021-09-23 MED ORDER — PHENOL 1.4 % MT LIQD
1.0000 | OROMUCOSAL | Status: DC | PRN
  Filled 2021-09-23: qty 177

## 2021-09-23 MED ORDER — KETAMINE HCL 50 MG/ML IJ SOLN
INTRAMUSCULAR | Status: DC | PRN
  Administered 2021-09-23 (×2): 25 mg via INTRAVENOUS

## 2021-09-23 MED ORDER — FENTANYL CITRATE PF 50 MCG/ML IJ SOSY
25.0000 ug | PREFILLED_SYRINGE | Freq: Once | INTRAMUSCULAR | Status: AC
  Administered 2021-09-23: 25 ug via INTRAVENOUS
  Filled 2021-09-23: qty 1

## 2021-09-23 MED ORDER — LACTATED RINGERS IV SOLN: INTRAVENOUS | Status: DC | PRN

## 2021-09-23 MED ORDER — MAGNESIUM HYDROXIDE 400 MG/5ML PO SUSP
30.0000 mL | Freq: Every day | ORAL | Status: DC
  Administered 2021-09-23: 30 mL via ORAL
  Filled 2021-09-23: qty 30

## 2021-09-23 MED ORDER — HYDROCODONE-ACETAMINOPHEN 5-325 MG PO TABS: 1.0000 | ORAL_TABLET | Freq: Four times a day (QID) | ORAL | Status: DC | PRN

## 2021-09-23 MED ORDER — LIDOCAINE HCL (PF) 2 % IJ SOLN
INTRAMUSCULAR | Status: AC
  Filled 2021-09-23: qty 5

## 2021-09-23 MED ORDER — DOCUSATE SODIUM 100 MG PO CAPS
100.0000 mg | ORAL_CAPSULE | Freq: Two times a day (BID) | ORAL | Status: DC
  Administered 2021-09-23 – 2021-09-25 (×3): 100 mg via ORAL
  Filled 2021-09-23 (×4): qty 1

## 2021-09-23 MED ORDER — GLYCOPYRROLATE 1 MG PO TABS
1.0000 mg | ORAL_TABLET | ORAL | Status: DC | PRN
  Filled 2021-09-23: qty 1

## 2021-09-23 MED ORDER — SODIUM CHLORIDE 0.9 % IV SOLN: INTRAVENOUS | Status: DC

## 2021-09-23 MED ORDER — ONDANSETRON HCL 4 MG/2ML IJ SOLN: 4.0000 mg | Freq: Four times a day (QID) | INTRAMUSCULAR | Status: DC | PRN

## 2021-09-23 MED ORDER — HYDROCODONE-ACETAMINOPHEN 7.5-325 MG PO TABS: 1.0000 | ORAL_TABLET | ORAL | Status: DC | PRN

## 2021-09-23 MED ORDER — METOCLOPRAMIDE HCL 5 MG/ML IJ SOLN: 5.0000 mg | Freq: Three times a day (TID) | INTRAMUSCULAR | Status: DC | PRN

## 2021-09-23 MED ORDER — MENTHOL 3 MG MT LOZG
1.0000 | LOZENGE | OROMUCOSAL | Status: DC | PRN
  Filled 2021-09-23: qty 9

## 2021-09-23 MED ORDER — TRAMADOL HCL 50 MG PO TABS
50.0000 mg | ORAL_TABLET | Freq: Four times a day (QID) | ORAL | Status: DC
  Administered 2021-09-23: 50 mg via ORAL
  Filled 2021-09-23: qty 1

## 2021-09-23 MED ORDER — PROPOFOL 10 MG/ML IV BOLUS
INTRAVENOUS | Status: DC | PRN
  Administered 2021-09-23 (×2): 25 mg via INTRAVENOUS

## 2021-09-23 MED ORDER — OXYCODONE HCL 5 MG PO TABS
2.5000 mg | ORAL_TABLET | Freq: Once | ORAL | Status: AC
  Administered 2021-09-23: 2.5 mg via ORAL
  Filled 2021-09-23: qty 1

## 2021-09-23 MED ORDER — NEOMYCIN-POLYMYXIN B GU 40-200000 IR SOLN
Status: AC
  Filled 2021-09-23: qty 4

## 2021-09-23 MED ORDER — SODIUM CHLORIDE 0.9 % IV BOLUS
500.0000 mL | Freq: Once | INTRAVENOUS | Status: AC
  Administered 2021-09-23: 500 mL via INTRAVENOUS

## 2021-09-23 MED ORDER — METOCLOPRAMIDE HCL 10 MG PO TABS: 5.0000 mg | ORAL_TABLET | Freq: Three times a day (TID) | ORAL | Status: DC | PRN

## 2021-09-23 MED ORDER — POLYETHYLENE GLYCOL 3350 17 G PO PACK: 17.0000 g | PACK | Freq: Every day | ORAL | Status: DC | PRN

## 2021-09-23 MED ORDER — ALUM & MAG HYDROXIDE-SIMETH 200-200-20 MG/5ML PO SUSP: 30.0000 mL | ORAL | Status: DC | PRN

## 2021-09-23 MED ORDER — FENTANYL CITRATE (PF) 100 MCG/2ML IJ SOLN
INTRAMUSCULAR | Status: DC | PRN
  Administered 2021-09-23: 25 ug via INTRAVENOUS

## 2021-09-23 MED ORDER — ACETAMINOPHEN 325 MG PO TABS
650.0000 mg | ORAL_TABLET | Freq: Once | ORAL | Status: AC
  Administered 2021-09-23: 650 mg via ORAL
  Filled 2021-09-23: qty 2

## 2021-09-23 MED ORDER — ACETAMINOPHEN 325 MG PO TABS: 325.0000 mg | ORAL_TABLET | Freq: Four times a day (QID) | ORAL | Status: DC | PRN

## 2021-09-23 MED ORDER — BUPIVACAINE HCL (PF) 0.5 % IJ SOLN
INTRAMUSCULAR | Status: AC
  Filled 2021-09-23: qty 10

## 2021-09-23 MED ORDER — SODIUM CHLORIDE 0.9 % IR SOLN
Status: DC | PRN
  Administered 2021-09-23: 1004 mL

## 2021-09-23 MED ORDER — QUETIAPINE FUMARATE 25 MG PO TABS: 25.0000 mg | ORAL_TABLET | Freq: Every day | ORAL | Status: DC

## 2021-09-23 MED ORDER — METHOCARBAMOL 500 MG PO TABS
500.0000 mg | ORAL_TABLET | Freq: Four times a day (QID) | ORAL | Status: DC | PRN
  Administered 2021-09-24: 500 mg via ORAL
  Filled 2021-09-23 (×3): qty 1

## 2021-09-23 MED ORDER — CLINDAMYCIN PHOSPHATE 600 MG/50ML IV SOLN
600.0000 mg | Freq: Four times a day (QID) | INTRAVENOUS | Status: AC
  Administered 2021-09-23 – 2021-09-24 (×2): 600 mg via INTRAVENOUS
  Filled 2021-09-23 (×2): qty 50

## 2021-09-23 MED ORDER — CLINDAMYCIN PHOSPHATE 600 MG/50ML IV SOLN
INTRAVENOUS | Status: AC
  Filled 2021-09-23: qty 50

## 2021-09-23 MED ORDER — MORPHINE SULFATE (PF) 2 MG/ML IV SOLN: 0.5000 mg | INTRAVENOUS | Status: DC | PRN

## 2021-09-23 MED ORDER — ENOXAPARIN SODIUM 40 MG/0.4ML IJ SOSY: 40.0000 mg | PREFILLED_SYRINGE | INTRAMUSCULAR | Status: DC

## 2021-09-23 MED ORDER — MIRTAZAPINE 15 MG PO TABS
45.0000 mg | ORAL_TABLET | Freq: Every day | ORAL | Status: DC
  Administered 2021-09-23: 45 mg via ORAL
  Filled 2021-09-23: qty 3

## 2021-09-23 MED ORDER — CLINDAMYCIN PHOSPHATE 600 MG/50ML IV SOLN
600.0000 mg | Freq: Once | INTRAVENOUS | Status: AC
  Administered 2021-09-23: 600 mg via INTRAVENOUS
  Filled 2021-09-23: qty 50

## 2021-09-23 MED ORDER — PROPOFOL 10 MG/ML IV BOLUS
INTRAVENOUS | Status: AC
  Filled 2021-09-23: qty 20

## 2021-09-23 MED ORDER — DIPHENHYDRAMINE HCL 12.5 MG/5ML PO ELIX: 12.5000 mg | ORAL_SOLUTION | ORAL | Status: DC | PRN

## 2021-09-23 MED ORDER — KETAMINE HCL 50 MG/5ML IJ SOSY
PREFILLED_SYRINGE | INTRAMUSCULAR | Status: AC
  Filled 2021-09-23: qty 5

## 2021-09-23 MED ORDER — PROPOFOL 500 MG/50ML IV EMUL
INTRAVENOUS | Status: DC | PRN
  Administered 2021-09-23: 25 ug/kg/min via INTRAVENOUS

## 2021-09-23 MED ORDER — HYDROCODONE-ACETAMINOPHEN 5-325 MG PO TABS: 1.0000 | ORAL_TABLET | ORAL | Status: DC | PRN

## 2021-09-23 MED ORDER — BISACODYL 10 MG RE SUPP: 10.0000 mg | Freq: Every day | RECTAL | Status: DC | PRN

## 2021-09-23 MED ORDER — FENTANYL CITRATE (PF) 100 MCG/2ML IJ SOLN
INTRAMUSCULAR | Status: AC
  Filled 2021-09-23: qty 2

## 2021-09-23 MED ORDER — MORPHINE SULFATE (PF) 2 MG/ML IV SOLN
0.5000 mg | INTRAVENOUS | Status: DC | PRN
  Administered 2021-09-25 (×4): 1 mg via INTRAVENOUS
  Filled 2021-09-23 (×4): qty 1

## 2021-09-23 MED ORDER — TEMAZEPAM 15 MG PO CAPS
15.0000 mg | ORAL_CAPSULE | Freq: Every day | ORAL | Status: DC
  Administered 2021-09-23: 15 mg via ORAL
  Filled 2021-09-23: qty 1

## 2021-09-23 MED ORDER — ONDANSETRON HCL 4 MG PO TABS: 4.0000 mg | ORAL_TABLET | Freq: Four times a day (QID) | ORAL | Status: DC | PRN

## 2021-09-23 SURGICAL SUPPLY — 62 items
BLADE SAGITTAL AGGR TOOTH XLG (BLADE) ×2 IMPLANT
BNDG COHESIVE 6X5 TAN ST LF (GAUZE/BANDAGES/DRESSINGS) ×4 IMPLANT
CANISTER WOUND CARE 500ML ATS (WOUND CARE) ×2 IMPLANT
CEMENT BONE 40GM (Cement) ×4 IMPLANT
CEMENT RESTRICTOR DEPUY SZ 3 (Cement) ×2 IMPLANT
CHLORAPREP W/TINT 26 (MISCELLANEOUS) ×2 IMPLANT
COVER BACK TABLE REUSABLE LG (DRAPES) ×2 IMPLANT
DRAPE 3/4 80X56 (DRAPES) ×4 IMPLANT
DRAPE C-ARM XRAY 36X54 (DRAPES) ×2 IMPLANT
DRAPE INCISE IOBAN 66X60 STRL (DRAPES) IMPLANT
DRAPE POUCH INSTRU U-SHP 10X18 (DRAPES) ×2 IMPLANT
DRESSING SURGICEL FIBRLLR 1X2 (HEMOSTASIS) ×1 IMPLANT
DRSG MEPILEX SACRM 8.7X9.8 (GAUZE/BANDAGES/DRESSINGS) ×2 IMPLANT
DRSG OPSITE POSTOP 4X8 (GAUZE/BANDAGES/DRESSINGS) ×4 IMPLANT
DRSG SURGICEL FIBRILLAR 1X2 (HEMOSTASIS) ×2
ELECT BLADE 6.5 EXT (BLADE) ×2 IMPLANT
ELECT REM PT RETURN 9FT ADLT (ELECTROSURGICAL) ×2
ELECTRODE REM PT RTRN 9FT ADLT (ELECTROSURGICAL) ×1 IMPLANT
GAUZE 4X4 16PLY ~~LOC~~+RFID DBL (SPONGE) ×2 IMPLANT
GLOVE SURG SYN 9.0  PF PI (GLOVE) ×2
GLOVE SURG SYN 9.0 PF PI (GLOVE) ×2 IMPLANT
GLOVE SURG UNDER POLY LF SZ9 (GLOVE) ×2 IMPLANT
GOWN SRG 2XL LVL 4 RGLN SLV (GOWNS) ×1 IMPLANT
GOWN STRL NON-REIN 2XL LVL4 (GOWNS) ×1
GOWN STRL REUS W/ TWL LRG LVL3 (GOWN DISPOSABLE) ×1 IMPLANT
GOWN STRL REUS W/TWL LRG LVL3 (GOWN DISPOSABLE) ×1
HEAD BIPOLAR SZ45 HEAD 28 (Head) ×2 IMPLANT
HEMOVAC 400CC 10FR (MISCELLANEOUS) IMPLANT
HIP FEM HD S 28 (Head) ×2 IMPLANT
HIP STEM FEM 2 STD (Stem) ×2 IMPLANT
HOLDER FOLEY CATH W/STRAP (MISCELLANEOUS) ×2 IMPLANT
KIT PREVENA INCISION MGT 13 (CANNISTER) ×2 IMPLANT
MANIFOLD NEPTUNE II (INSTRUMENTS) ×2 IMPLANT
MAT ABSORB  FLUID 56X50 GRAY (MISCELLANEOUS) ×1
MAT ABSORB FLUID 56X50 GRAY (MISCELLANEOUS) ×1 IMPLANT
NDL SAFETY ECLIPSE 18X1.5 (NEEDLE) ×1 IMPLANT
NEEDLE HYPO 18GX1.5 SHARP (NEEDLE) ×1
NEEDLE SPNL 20GX3.5 QUINCKE YW (NEEDLE) IMPLANT
NS IRRIG 1000ML POUR BTL (IV SOLUTION) ×2 IMPLANT
PACK HIP COMPR (MISCELLANEOUS) ×2 IMPLANT
SCALPEL PROTECTED #10 DISP (BLADE) ×4 IMPLANT
SOL PREP PVP 2OZ (MISCELLANEOUS)
SOLUTION PREP PVP 2OZ (MISCELLANEOUS) IMPLANT
SOLUTION PRONTOSAN WOUND 350ML (IRRIGATION / IRRIGATOR) IMPLANT
SPONGE DRAIN TRACH 4X4 STRL 2S (GAUZE/BANDAGES/DRESSINGS) IMPLANT
SPONGE T-LAP 18X18 ~~LOC~~+RFID (SPONGE) ×4 IMPLANT
STAPLER SKIN PROX 35W (STAPLE) ×2 IMPLANT
STRAP SAFETY 5IN WIDE (MISCELLANEOUS) ×2 IMPLANT
SUT DVC 2 QUILL PDO  T11 36X36 (SUTURE) ×1
SUT DVC 2 QUILL PDO T11 36X36 (SUTURE) ×1 IMPLANT
SUT SILK 0 (SUTURE) ×1
SUT SILK 0 30XBRD TIE 6 (SUTURE) ×1 IMPLANT
SUT V-LOC 90 ABS DVC 3-0 CL (SUTURE) ×2 IMPLANT
SUT VIC AB 1 CT1 36 (SUTURE) ×2 IMPLANT
SYR 20ML LL LF (SYRINGE) ×2 IMPLANT
SYR 30ML LL (SYRINGE) ×2 IMPLANT
SYR 50ML LL SCALE MARK (SYRINGE) IMPLANT
SYR BULB IRRIG 60ML STRL (SYRINGE) ×2 IMPLANT
TAPE MICROFOAM 4IN (TAPE) IMPLANT
TOWEL OR 17X26 4PK STRL BLUE (TOWEL DISPOSABLE) ×2 IMPLANT
TRAY FOLEY MTR SLVR 16FR STAT (SET/KITS/TRAYS/PACK) ×2 IMPLANT
WATER STERILE IRR 500ML POUR (IV SOLUTION) IMPLANT

## 2021-09-23 NOTE — Transfer of Care (Signed)
Immediate Anesthesia Transfer of Care Note  Patient: Angel Brandt  Procedure(s) Performed: ARTHROPLASTY BIPOLAR HIP (HEMIARTHROPLASTY) (Left: Hip)  Patient Location: PACU  Anesthesia Type:Spinal  Level of Consciousness: awake  Airway & Oxygen Therapy: Patient Spontanous Breathing  Post-op Assessment: Report given to RN and Post -op Vital signs reviewed and stable  Post vital signs: Reviewed  Last Vitals:  Vitals Value Taken Time  BP 140/59 09/23/21 1600  Temp    Pulse 79 09/23/21 1601  Resp 16 09/23/21 1601  SpO2 97 % 09/23/21 1601  Vitals shown include unvalidated device data.  Last Pain:  Vitals:   09/23/21 1326  TempSrc: Temporal  PainSc:          Complications: No notable events documented.

## 2021-09-23 NOTE — ED Notes (Signed)
Pt to ED with daughter, pt lives at home, followed by hospice, dementia since 2018 after stroke, many recent falls, walks with walker but fell this morning and landed on L shoulder. Pt resting in recliner, appears to be in pain.

## 2021-09-23 NOTE — H&P (Signed)
History and Physical    Xaria Judon Ludemann XNA:[id number] DOB: 19-Jun-1924 DOA: 09/23/2021  PCP: Maryland Pink, MD   Patient coming from: Home  I have personally briefly reviewed patient's old medical records in [place]  Chief Complaint: Status post fall  Most of the history was obtained from patient's daughter who was at the bedside.  Patient is unable to provide any history due to her underlying dementia.  HPI: Angel Brandt is a 87 y.o. female with medical history significant for dementia, macular degeneration, legal blindness, hearing impaired who was brought into the ER by EMS for evaluation after an unwitnessed fall at home. Patient's daughter states that she has had multiple falls recently due to the fact that she forgets that she needs a walker to ambulate and she gets up and falls. This morning she found her on the floor lying on her left side close to the bathroom at about 6 AM and patient was unable to get up due to severe pain involving her left hip and shoulder. I am unable to do a review of systems on this patient due to her underlying dementia. Sodium 132, potassium 3.8, chloride 100, bicarb 25, glucose 104, BUN 22, creatinine 0.70, calcium 8.5, white count 12.6, hemoglobin 9.8, hematocrit 29.8, MCV 90.6, RDW 13.5, platelet count 236 Respiratory viral panel is negative Chest x-ray reviewed by me shows coarse interstitial opacities in both lungs, predominantly in the apices and significantly worse on the right consistent with interstitial lung disease. Left hip x-ray shows moderately displaced proximal left femoral neck fracture. Left shoulder x-ray shows minimally displaced and angulated fracture of the left humeral neck. CT scan of the cervical spine without contrast shows no acute intracranial abnormality.  No CT evidence of acute cervical spine injury.  Bronchiectasis and reticulonodular opacities seen at the bilateral lung apices possibly due to chronic atypical  infection. CT scan of the head without contrast shows no evidence of acute infarction, hemorrhage, hydrocephalus or mass-effect.  Chronic white matter ischemic change. Twelve-lead EKG reviewed by me shows sinus tachycardia with a first-degree AV block.  ED Course: Patient is an 87 year old female who presents to the emergency room via EMS for evaluation of an unwitnessed fall and is found to have a left humeral and femoral neck fracture. She will be admitted to the hospital for further evaluation.   Review of Systems: As per HPI otherwise all other systems reviewed and negative.    Past Medical History:  Diagnosis Date   At high risk for falls    Blind    Breast cancer ([place])    Cancer ([place])    Sherran Needs syndrome 2017   GERD (gastroesophageal reflux disease)    Hearing impaired    Macular degeneration    Malaria    Osteoporosis     Past Surgical History:  Procedure Laterality Date   BREAST LUMPECTOMY     BREAST SURGERY     INTRAMEDULLARY (IM) NAIL INTERTROCHANTERIC Right 04/30/2019   Procedure: INTRAMEDULLARY (IM) NAIL INTERTROCHANTRIC;  Surgeon: Thornton Park, MD;  Location: [facility] ORS;  Service: Orthopedics;  Laterality: Right;   IRRIGATION AND DEBRIDEMENT FOOT Right 01/09/2020   Procedure: IRRIGATION AND DEBRIDEMENT FOOT;  Surgeon: Sharlotte Alamo, DPM;  Location: [facility] ORS;  Service: Podiatry;  Laterality: Right;   LOWER EXTREMITY ANGIOGRAPHY Right 01/08/2020   Procedure: Lower Extremity Angiography;  Surgeon: Katha Cabal, MD;  Location: [place] [facility];  Service: Cardiovascular;  Laterality: Right;     reports that  she has never smoked. Her smokeless tobacco use includes snuff. She reports that she does not drink alcohol and does not use drugs.  Allergies  Allergen Reactions   Codeine Anaphylaxis and Other (See Comments)   Penicillins Anaphylaxis and Nausea And Vomiting    Other reaction(s): Unknown   Aspirin Nausea And Vomiting and Other (See Comments)     unknown   Codeine Sulfate Nausea And Vomiting   Influenza Virus Vaccine     Other reaction(s): Unknown   Other Other (See Comments)   Sulfa Antibiotics Other (See Comments) and Nausea And Vomiting    unknown   Tetanus-Diphth-Acell Pertussis     Other reaction(s): Unknown   Ativan [Lorazepam] Other (See Comments)    Agitation (can take low doses)    History reviewed. No pertinent family history.  Patient is unable to provide this information  Prior to Admission medications   Medication Sig Start Date End Date Taking? Authorizing Provider  mirtazapine (REMERON) 45 MG tablet Take 45 mg by mouth at bedtime. 04/07/20  Yes [provider]  QUEtiapine (SEROQUEL) 25 MG tablet Take 25 mg by mouth daily at 6 (six) AM. 04/28/20  Yes [provider]  QUEtiapine (SEROQUEL) 50 MG tablet Take 50 mg by mouth every evening. 07/26/21  Yes [provider]  temazepam (RESTORIL) 15 MG capsule Take 15 mg by mouth at bedtime. 07/26/21  Yes [provider]  traMADol (ULTRAM) 50 MG tablet Take 50 mg by mouth every 6 (six) hours as needed for pain. 04/23/21  Yes [provider]  glycopyrrolate (ROBINUL) 1 MG tablet Take 1 tablet (1 mg total) by mouth every 4 (four) hours as needed (excessive secretions). 01/14/20   Max Sane, MD    Physical Exam: Vitals:   09/23/21 0656  BP: (!) 150/73  Pulse: 68  Resp: 18  Temp: 98.4 F (36.9 C)  TempSrc: Oral  SpO2: 99%     Vitals:   09/23/21 0656  BP: (!) 150/73  Pulse: 68  Resp: 18  Temp: 98.4 F (36.9 C)  TempSrc: Oral  SpO2: 99%      Constitutional: Alert and oriented only to person not to place or time.  Agitated and requesting to have a sling on her left shoulder removed.  Thin and frail.  Hearing impaired HEENT:      Head: Normocephalic and atraumatic.         Eyes: PERLA, EOMI, Conjunctivae are normal. Sclera is non-icteric.       Mouth/Throat: Mucous membranes are moist.       Neck: Supple with no signs  of meningismus. Cardiovascular: Regular rate and rhythm. No murmurs, gallops, or rubs. 2+ symmetrical distal pulses are present . No JVD. No LE edema Respiratory: Respiratory effort normal .Lungs sounds clear bilaterally. No wheezes, crackles, or rhonchi.  Gastrointestinal: Soft, non tender, and non distended with positive bowel sounds.  Genitourinary: No CVA tenderness. Musculoskeletal: Decreased range of motion left shoulder and left hip.   Sling over left shoulder.  Shortening of left lower extremity.   Neurologic:  Face is symmetric. Moving all extremities. No gross focal neurologic deficits . Skin: Skin is warm, dry.  Multiple bruises involving her lower extremities Psychiatric: Mood and affect are normal.   Labs on Admission: I have personally reviewed following labs and imaging studies  CBC: Recent Labs  Lab 09/23/21 1110  WBC 12.6*  NEUTROABS 11.2*  HGB 9.8*  HCT 29.8*  MCV 90.6  PLT 062   Basic Metabolic  Panel: Recent Labs  Lab 09/23/21 1110  NA 132*  K 3.8  CL 100  CO2 25  GLUCOSE 104*  BUN 22  CREATININE 0.78  CALCIUM 8.5*   GFR: CrCl cannot be calculated (Unknown ideal weight.). Liver Function Tests: No results for input(s): AST, ALT, ALKPHOS, BILITOT, PROT, ALBUMIN in the last 168 hours. No results for input(s): LIPASE, AMYLASE in the last 168 hours. No results for input(s): AMMONIA in the last 168 hours. Coagulation Profile: No results for input(s): INR, PROTIME in the last 168 hours. Cardiac Enzymes: No results for input(s): CKTOTAL, CKMB, CKMBINDEX, TROPONINI in the last 168 hours. BNP (last 3 results) No results for input(s): PROBNP in the last 8760 hours. HbA1C: No results for input(s): HGBA1C in the last 72 hours. CBG: No results for input(s): GLUCAP in the last 168 hours. Lipid Profile: No results for input(s): CHOL, HDL, LDLCALC, TRIG, CHOLHDL, LDLDIRECT in the last 72 hours. Thyroid Function Tests: No results for input(s): TSH, T4TOTAL,  FREET4, T3FREE, THYROIDAB in the last 72 hours. Anemia Panel: No results for input(s): VITAMINB12, FOLATE, FERRITIN, TIBC, IRON, RETICCTPCT in the last 72 hours. Urine analysis:    Component Value Date/Time   COLORURINE STRAW (A) 04/29/2019 2204   APPEARANCEUR CLEAR (A) 04/29/2019 2204   APPEARANCEUR Clear 08/21/2014 1938   LABSPEC 1.004 (L) 04/29/2019 2204   LABSPEC 1.006 08/21/2014 1938   PHURINE 7.0 04/29/2019 2204   GLUCOSEU NEGATIVE 04/29/2019 2204   GLUCOSEU Negative 08/21/2014 1938   HGBUR SMALL (A) 04/29/2019 2204   BILIRUBINUR NEGATIVE 04/29/2019 2204   BILIRUBINUR Negative 08/21/2014 1938   KETONESUR NEGATIVE 04/29/2019 2204   PROTEINUR NEGATIVE 04/29/2019 2204   NITRITE NEGATIVE 04/29/2019 2204   LEUKOCYTESUR NEGATIVE 04/29/2019 2204   LEUKOCYTESUR Trace 08/21/2014 1938    Radiological Exams on Admission: DG Chest 1 View  Result Date: 09/23/2021 CLINICAL DATA:  Preop for hip fracture EXAM: CHEST  1 VIEW COMPARISON:  Chest radiograph 04/29/2019 FINDINGS: The heart is at the upper limits of normal for size. Upper mediastinal contours are stable. There are coarse reticular opacities predominantly in the lung apices, right worse than left. These have significantly worsened since 2020. There is no significant pleural effusion. There is no pneumothorax. A fracture of the proximal left humerus is again seen, better evaluated on same-day shoulder radiographs. IMPRESSION: Coarse interstitial opacities in both lungs, predominantly in the apices and significantly worse on the right consistent with interstitial lung disease, progressed since 04/29/2019. Superimposed infection would be difficult to exclude. Electronically Signed   By: Valetta Mole M.D.   On: 09/23/2021 11:05   CT HEAD WO CONTRAST (5MM)  Result Date: 09/23/2021 CLINICAL DATA:  Trauma EXAM: CT HEAD WITHOUT CONTRAST CT CERVICAL SPINE WITHOUT CONTRAST TECHNIQUE: Multidetector CT imaging of the head and cervical spine was  performed following the standard protocol without intravenous contrast. Multiplanar CT image reconstructions of the cervical spine were also generated. COMPARISON:  Head CT dated July 04/13/2019 FINDINGS: CT HEAD FINDINGS Brain: Chronic white matter ischemic change. Generalized atrophy which is likely age related. No evidence of acute infarction, hemorrhage, hydrocephalus, extra-axial collection or mass lesion/mass effect. Vascular: No hyperdense vessel or unexpected calcification. Skull: Normal. Negative for fracture or focal lesion. Sinuses/Orbits: No acute finding. Other: None. CT CERVICAL SPINE FINDINGS Alignment: Normal. Skull base and vertebrae: Evaluation is limited due to motion artifact. No definite fracture. Soft tissues and spinal canal: No prevertebral fluid or swelling. No visible canal hematoma. Disc levels:  Multilevel moderate degenerative disc  disease. Upper chest: Bilateral bronchiectasis and reticulonodular opacities. Other: None IMPRESSION: 1. No acute intracranial abnormality. 2. No CT evidence of acute cervical spine injury. 3. Bronchiectasis and reticulonodular opacities seen at the bilateral lung apices, possibly due to chronic atypical infection. Findings could be further evaluated with dedicated chest CT. Electronically Signed   By: Yetta Glassman M.D.   On: 09/23/2021 08:15   CT Cervical Spine Wo Contrast  Result Date: 09/23/2021 CLINICAL DATA:  Trauma EXAM: CT HEAD WITHOUT CONTRAST CT CERVICAL SPINE WITHOUT CONTRAST TECHNIQUE: Multidetector CT imaging of the head and cervical spine was performed following the standard protocol without intravenous contrast. Multiplanar CT image reconstructions of the cervical spine were also generated. COMPARISON:  Head CT dated July 04/13/2019 FINDINGS: CT HEAD FINDINGS Brain: Chronic white matter ischemic change. Generalized atrophy which is likely age related. No evidence of acute infarction, hemorrhage, hydrocephalus, extra-axial collection or  mass lesion/mass effect. Vascular: No hyperdense vessel or unexpected calcification. Skull: Normal. Negative for fracture or focal lesion. Sinuses/Orbits: No acute finding. Other: None. CT CERVICAL SPINE FINDINGS Alignment: Normal. Skull base and vertebrae: Evaluation is limited due to motion artifact. No definite fracture. Soft tissues and spinal canal: No prevertebral fluid or swelling. No visible canal hematoma. Disc levels:  Multilevel moderate degenerative disc disease. Upper chest: Bilateral bronchiectasis and reticulonodular opacities. Other: None IMPRESSION: 1. No acute intracranial abnormality. 2. No CT evidence of acute cervical spine injury. 3. Bronchiectasis and reticulonodular opacities seen at the bilateral lung apices, possibly due to chronic atypical infection. Findings could be further evaluated with dedicated chest CT. Electronically Signed   By: Yetta Glassman M.D.   On: 09/23/2021 08:15   DG Shoulder Left  Result Date: 09/23/2021 CLINICAL DATA:  Fall.  Pain. EXAM: LEFT SHOULDER - 2+ VIEW COMPARISON:  None. FINDINGS: Three views study shows a minimally displaced and angulated fracture of the left humeral neck. Acromioclavicular and coracoclavicular distances are preserved. Bones are markedly osteopenic. IMPRESSION: Minimally displaced and angulated fracture of the left humeral neck. Marked bony demineralization. Electronically Signed   By: Misty Stanley M.D.   On: 09/23/2021 07:42   DG Hip Unilat W or Wo Pelvis 2-3 Views Left  Result Date: 09/23/2021 CLINICAL DATA:  Left hip pain after fall. EXAM: DG HIP (WITH OR WITHOUT PELVIS) 2-3V LEFT COMPARISON:  December 01, 2015. FINDINGS: Moderately displaced proximal left femoral neck fracture. IMPRESSION: Moderately displaced proximal left femoral neck fracture. Electronically Signed   By: Marijo Conception M.D.   On: 09/23/2021 10:13     Assessment/Plan Principal Problem:   Closed displaced fracture of left femoral neck (HCC) Active  Problems:   Degeneration macular   Anemia of chronic disease   Dementia without behavioral disturbance [facility])      Patient is a 87 year old female admitted to the hospital for following an unwitnessed fall at home with a left humeral and femoral neck fracture.   Closed displaced fracture of left femoral neck Immobilize left lower extremity Pain control Place patient on muscle relaxants Consult orthopedic surgery Fall precautions  Status post fall with a left humeral fracture Immobilize left upper extremity Pain control    Dementia Continue Seroquel and mirtazapine   Macular degeneration/legally blind Patient will need assistance with activities of daily living  DVT prophylaxis: SCD Code Status: DO NOT RESUSCITATE Family Communication: Greater than 50% of time was spent discussing patient's condition and plan of care with patient's daughter at the bedside.  All questions and concerns have been addressed.  CODE STATUS was discussed and patient is a DO NOT RESUSCITATE.  She verbalizes understanding and agrees with the plan of care.  Discussed with patient's daughter who is willing to stay with her mother in the hospital since she needs frequent reorientation and remains at very high risk for further falls. Disposition Plan: Back to previous home environment Consults called: Orthopedic surgery Status:At the time of admission, it appears that the appropriate admission status for this patient is inpatient. This is judged to be reasonable and necessary to provide the required intensity of service to ensure the patient's safety given the presenting symptoms, physical exam findings, and initial radiographic and laboratory data in the context of their comorbid conditions. Patient requires inpatient status due to high intensity of service, high risk for further deterioration and high frequency of surveillance required.     Collier Bullock MD [facility]     09/23/2021,  12:29 PM

## 2021-09-23 NOTE — Anesthesia Preprocedure Evaluation (Signed)
Anesthesia Evaluation  Patient identified by MRN, date of birth, ID band Patient confused    Reviewed: Allergy & Precautions, NPO status , Patient's Chart, lab work & pertinent test results  History of Anesthesia Complications Negative for: history of anesthetic complications  Airway Mallampati: III  TM Distance: >3 FB Neck ROM: full    Dental  (+) Chipped, Poor Dentition, Missing   Pulmonary neg pulmonary ROS, neg shortness of breath,     + decreased breath sounds      Cardiovascular Exercise Tolerance: Good (-) angina(-) Past MI negative cardio ROS Normal cardiovascular exam     Neuro/Psych PSYCHIATRIC DISORDERS Dementia TIA   GI/Hepatic negative GI ROS, Neg liver ROS, GERD  ,  Endo/Other  negative endocrine ROS  Renal/GU Renal disease     Musculoskeletal   Abdominal   Peds  Hematology negative hematology ROS (+)   Anesthesia Other Findings Past Medical History: No date: At high risk for falls No date: Blind No date: Breast cancer [facility]) No date: Cancer [facility]) 2017: Sherran Needs syndrome No date: GERD (gastroesophageal reflux disease) No date: Hearing impaired No date: Macular degeneration No date: Malaria No date: Osteoporosis  Past Surgical History: No date: BREAST LUMPECTOMY No date: BREAST SURGERY 04/30/2019: INTRAMEDULLARY (IM) NAIL INTERTROCHANTERIC; Right     Comment:  Procedure: INTRAMEDULLARY (IM) NAIL INTERTROCHANTRIC;                Surgeon: Thornton Park, MD;  Location: [facility];                Service: Orthopedics;  Laterality: Right; 01/09/2020: IRRIGATION AND DEBRIDEMENT FOOT; Right     Comment:  Procedure: IRRIGATION AND DEBRIDEMENT FOOT;  Surgeon:               Sharlotte Alamo, DPM;  Location: [facility];  Service:               Podiatry;  Laterality: Right; 01/08/2020: LOWER EXTREMITY ANGIOGRAPHY; Right     Comment:  Procedure: Lower Extremity Angiography;  Surgeon:               Katha Cabal, MD;  Location: [place] [facility];               Service: Cardiovascular;  Laterality: Right;     Reproductive/Obstetrics negative OB ROS                             Anesthesia Physical Anesthesia Plan  ASA: 3  Anesthesia Plan: Spinal   Post-op Pain Management:    Induction:   PONV Risk Score and Plan:   Airway Management Planned: Natural Airway and Nasal Cannula  Additional Equipment:   Intra-op Plan:   Post-operative Plan:   Informed Consent: I have reviewed the patients History and Physical, chart, labs and discussed the procedure including the risks, benefits and alternatives for the proposed anesthesia with the patient or authorized representative who has indicated his/her understanding and acceptance.   Patient has DNR.  Discussed DNR with power of attorney and Suspend DNR.   Dental Advisory Given  Plan Discussed with: Anesthesiologist, CRNA and Surgeon  Anesthesia Plan Comments: (Daughter reports no bleeding problems and no anticoagulant use.  Plan for spinal with backup GA  Daughter consented for risks of anesthesia including but not limited to:  - adverse reactions to medications - damage to eyes, teeth, lips or other oral mucosa - nerve damage due to  positioning  - risk of bleeding, infection and or nerve damage from spinal that could lead to paralysis - risk of headache or failed spinal - damage to teeth, lips or other oral mucosa - sore throat or hoarseness - damage to heart, brain, nerves, lungs, other parts of body or loss of life  She voiced understanding.)        Anesthesia Quick Evaluation

## 2021-09-23 NOTE — Progress Notes (Addendum)
Pt BP at 93/40 MAP 57 HR 75. NP Randol Kern made aware. Will continue to monitor.  Update 2044: NP Randol Kern states to monitor at this time and re-check BP after 1 hour. Will continue to monitor.  Update 2243: Pt BP at 109/41 MAP 61 NP Randol Kern made aware. Pt also on scheduled temazepam and mirtazapine tonight and NP Randol Kern ordered to give medicine and give 500 cc bolus once to patient. Will continue to monitor.  Update 0214: BP improved after 500 cc bolus at 123 47 MAP 68. NP Randol Kern made aware. Will continue to monitor.

## 2021-09-23 NOTE — Anesthesia Procedure Notes (Signed)
Spinal  Patient location during procedure: OR Reason for block: surgical anesthesia Staffing Performed: anesthesiologist  Anesthesiologist: Piscitello, Precious Haws, MD Resident/CRNA: Rolla Plate, CRNA Preanesthetic Checklist Completed: patient identified, IV checked, site marked, risks and benefits discussed, surgical consent, monitors and equipment checked, pre-op evaluation and timeout performed Spinal Block Patient position: sitting Prep: ChloraPrep and site prepped and draped Patient monitoring: heart rate, continuous pulse ox, blood pressure and cardiac monitor Approach: midline Location: L3-4 Injection technique: single-shot Needle Needle type: Whitacre and Introducer  Needle gauge: 24 G Needle length: 9 cm Assessment Sensory level: T10 Events: CSF return Additional Notes Negative paresthesia. Negative blood return. Positive free-flowing CSF. Expiration date of kit checked and confirmed. Patient tolerated procedure well, without complications.

## 2021-09-23 NOTE — ED Provider Notes (Signed)
[facility] Emergency Department Provider Note ____________________________________________  Time seen: Approximately 9:31 AM  I have reviewed the triage vital signs and the nursing notes.   HISTORY  Chief Complaint Fall    HPI Angel Brandt is a 87 y.o. female who presents to the emergency department for evaluation and treatment of left arm pain after unwitnessed fall at home this morning.  Daughter states that she had gotten out of bed without her walker. Daughter usually able to have patient assist with getting up, but was unable to this morning and had to call EMS.   Past Medical History:  Diagnosis Date   At high risk for falls    Blind    Breast cancer ([place])    Cancer ([place])    Sherran Needs syndrome 2017   GERD (gastroesophageal reflux disease)    Hearing impaired    Macular degeneration    Malaria    Osteoporosis     Patient Active Problem List   Diagnosis Date Noted   Closed displaced fracture of left femoral neck ([place]) 09/23/2021   AKI (acute kidney injury) ([place])    Palliative care by specialist    Goals of care, counseling/discussion    Complicated wound infection    Dementia without behavioral disturbance ([place])    Second degree burn of foot, right, sequela 01/08/2020   Dehydration 01/08/2020   Hyponatremia 01/08/2020   Anemia of chronic disease 01/08/2020   Pressure injury of skin 01/08/2020   Protein-calorie malnutrition, severe 04/30/2019   Closed right hip fracture, initial encounter ([place]) 04/29/2019   TIA (transient ischemic attack) 10/18/2017   Carcinoma of overlapping sites of right breast in female, estrogen receptor positive ([place]) 02/10/2017   Diverticulitis 02/10/2016   H/O malaria 02/10/2016   Degeneration macular 02/10/2016   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   Gait instability 12/04/2014    Past Surgical History:  Procedure Laterality Date   BREAST LUMPECTOMY     BREAST SURGERY     INTRAMEDULLARY (IM) NAIL  INTERTROCHANTERIC Right 04/30/2019   Procedure: INTRAMEDULLARY (IM) NAIL INTERTROCHANTRIC;  Surgeon: Thornton Park, MD;  Location: [facility] ORS;  Service: Orthopedics;  Laterality: Right;   IRRIGATION AND DEBRIDEMENT FOOT Right 01/09/2020   Procedure: IRRIGATION AND DEBRIDEMENT FOOT;  Surgeon: Sharlotte Alamo, DPM;  Location: [facility] ORS;  Service: Podiatry;  Laterality: Right;   LOWER EXTREMITY ANGIOGRAPHY Right 01/08/2020   Procedure: Lower Extremity Angiography;  Surgeon: Katha Cabal, MD;  Location: [place] [facility];  Service: Cardiovascular;  Laterality: Right;    Prior to Admission medications   Medication Sig Start Date End Date Taking? Authorizing Provider  glycopyrrolate (ROBINUL) 1 MG tablet Take 1 tablet (1 mg total) by mouth every 4 (four) hours as needed (excessive secretions). 01/14/20   Max Sane, MD    Allergies Codeine, Penicillins, Aspirin, Ativan [lorazepam], Codeine sulfate, Influenza virus vaccine, Other, Sulfa antibiotics, and Tetanus-diphth-acell pertussis  History reviewed. No pertinent family history.  Social History Social History   Tobacco Use   Smoking status: Never   Smokeless tobacco: Current    Types: Snuff  Vaping Use   Vaping Use: Never used  Substance Use Topics   Alcohol use: No   Drug use: No    Review of Systems Constitutional: Negative for fever. Cardiovascular: Negative for chest pain. Respiratory: Negative for shortness of breath. Musculoskeletal: Positive for left arm pain. Skin: Positive for skin tear and contusion  Neurological: Negative for decrease in sensation  ____________________________________________   PHYSICAL EXAM:  VITAL  SIGNS: ED Triage Vitals [09/23/21 0656]  Enc Vitals Group     BP (!) 150/73     Pulse Rate 68     Resp 18     Temp 98.4 F (36.9 C)     Temp Source Oral     SpO2 99 %     Weight      Height      Head Circumference      Peak Flow      Pain Score      Pain Loc      Pain Edu?      Excl. in  [place]?     Constitutional: Alert and oriented. Well appearing and in no acute distress. Eyes: Conjunctivae are clear without discharge or drainage Head: Atraumatic Neck: Supple. Respiratory: No cough. Respirations are even and unlabored. Musculoskeletal: Obvious deformity left proximal humerus/shoulder; pain with movement of left lower extremity.  Neurologic: Awake, alert, confused to place and time  Skin: Skin tear to right arm, contusion of left knee.  Psychiatric: Affect and behavior are appropriate.  ____________________________________________   LABS (all labs ordered are listed, but only abnormal results are displayed)  Labs Reviewed  RESP PANEL BY RT-PCR (FLU A&B, COVID) [id number]  BASIC METABOLIC PANEL  CBC WITH DIFFERENTIAL/PLATELET  URINALYSIS, ROUTINE W REFLEX MICROSCOPIC  TYPE AND SCREEN   ____________________________________________  RADIOLOGY  X-ray of the left hip shows left femoral neck fracture. X-ray of the left shoulder shows humeral neck fracture.  I, Sherrie George, personally viewed and evaluated these images (plain radiographs) as part of my medical decision making, as well as reviewing the written report by the radiologist.  DG Chest 1 View  Result Date: 09/23/2021 CLINICAL DATA:  Preop for hip fracture EXAM: CHEST  1 VIEW COMPARISON:  Chest radiograph 04/29/2019 FINDINGS: The heart is at the upper limits of normal for size. Upper mediastinal contours are stable. There are coarse reticular opacities predominantly in the lung apices, right worse than left. These have significantly worsened since 2020. There is no significant pleural effusion. There is no pneumothorax. A fracture of the proximal left humerus is again seen, better evaluated on same-day shoulder radiographs. IMPRESSION: Coarse interstitial opacities in both lungs, predominantly in the apices and significantly worse on the right consistent with interstitial lung disease, progressed since 04/29/2019.  Superimposed infection would be difficult to exclude. Electronically Signed   By: Valetta Mole M.D.   On: 09/23/2021 11:05   CT HEAD WO CONTRAST (5MM)  Result Date: 09/23/2021 CLINICAL DATA:  Trauma EXAM: CT HEAD WITHOUT CONTRAST CT CERVICAL SPINE WITHOUT CONTRAST TECHNIQUE: Multidetector CT imaging of the head and cervical spine was performed following the standard protocol without intravenous contrast. Multiplanar CT image reconstructions of the cervical spine were also generated. COMPARISON:  Head CT dated July 04/13/2019 FINDINGS: CT HEAD FINDINGS Brain: Chronic white matter ischemic change. Generalized atrophy which is likely age related. No evidence of acute infarction, hemorrhage, hydrocephalus, extra-axial collection or mass lesion/mass effect. Vascular: No hyperdense vessel or unexpected calcification. Skull: Normal. Negative for fracture or focal lesion. Sinuses/Orbits: No acute finding. Other: None. CT CERVICAL SPINE FINDINGS Alignment: Normal. Skull base and vertebrae: Evaluation is limited due to motion artifact. No definite fracture. Soft tissues and spinal canal: No prevertebral fluid or swelling. No visible canal hematoma. Disc levels:  Multilevel moderate degenerative disc disease. Upper chest: Bilateral bronchiectasis and reticulonodular opacities. Other: None IMPRESSION: 1. No acute intracranial abnormality. 2. No CT evidence of acute cervical spine injury. 3. Bronchiectasis  and reticulonodular opacities seen at the bilateral lung apices, possibly due to chronic atypical infection. Findings could be further evaluated with dedicated chest CT. Electronically Signed   By: Yetta Glassman M.D.   On: 09/23/2021 08:15   CT Cervical Spine Wo Contrast  Result Date: 09/23/2021 CLINICAL DATA:  Trauma EXAM: CT HEAD WITHOUT CONTRAST CT CERVICAL SPINE WITHOUT CONTRAST TECHNIQUE: Multidetector CT imaging of the head and cervical spine was performed following the standard protocol without intravenous  contrast. Multiplanar CT image reconstructions of the cervical spine were also generated. COMPARISON:  Head CT dated July 04/13/2019 FINDINGS: CT HEAD FINDINGS Brain: Chronic white matter ischemic change. Generalized atrophy which is likely age related. No evidence of acute infarction, hemorrhage, hydrocephalus, extra-axial collection or mass lesion/mass effect. Vascular: No hyperdense vessel or unexpected calcification. Skull: Normal. Negative for fracture or focal lesion. Sinuses/Orbits: No acute finding. Other: None. CT CERVICAL SPINE FINDINGS Alignment: Normal. Skull base and vertebrae: Evaluation is limited due to motion artifact. No definite fracture. Soft tissues and spinal canal: No prevertebral fluid or swelling. No visible canal hematoma. Disc levels:  Multilevel moderate degenerative disc disease. Upper chest: Bilateral bronchiectasis and reticulonodular opacities. Other: None IMPRESSION: 1. No acute intracranial abnormality. 2. No CT evidence of acute cervical spine injury. 3. Bronchiectasis and reticulonodular opacities seen at the bilateral lung apices, possibly due to chronic atypical infection. Findings could be further evaluated with dedicated chest CT. Electronically Signed   By: Yetta Glassman M.D.   On: 09/23/2021 08:15   DG Shoulder Left  Result Date: 09/23/2021 CLINICAL DATA:  Fall.  Pain. EXAM: LEFT SHOULDER - 2+ VIEW COMPARISON:  None. FINDINGS: Three views study shows a minimally displaced and angulated fracture of the left humeral neck. Acromioclavicular and coracoclavicular distances are preserved. Bones are markedly osteopenic. IMPRESSION: Minimally displaced and angulated fracture of the left humeral neck. Marked bony demineralization. Electronically Signed   By: Misty Stanley M.D.   On: 09/23/2021 07:42   DG Hip Unilat W or Wo Pelvis 2-3 Views Left  Result Date: 09/23/2021 CLINICAL DATA:  Left hip pain after fall. EXAM: DG HIP (WITH OR WITHOUT PELVIS) 2-3V LEFT COMPARISON:   December 01, 2015. FINDINGS: Moderately displaced proximal left femoral neck fracture. IMPRESSION: Moderately displaced proximal left femoral neck fracture. Electronically Signed   By: Marijo Conception M.D.   On: 09/23/2021 10:13   ____________________________________________   PROCEDURES  Procedures  ____________________________________________   INITIAL IMPRESSION / ASSESSMENT AND PLAN / ED COURSE  Crystale A Weick is a 87 y.o. who presents to the emergency department for treatment and evaluation after an unwitnessed fall this morning.  CT head and cervical spine are negative for acute findings.  X-ray of the left shoulder does show a humeral neck fracture.  X-ray of the left hip shows a left femoral neck fracture.  Plan will be to discuss with orthopedics admit via hospitalist service.  Daughter reports that she has not on blood thinners with the exception of an 81 mg aspirin but has not had that since day before yesterday. She has not had any food or fluids today.   Dr. Rudene Christians aware of patient and requests to keep her NPO. Daughter aware and agreeable to plan of admission and surgical repair.  Dr. Francine Graven accepts patient for admission.   Medications  fentaNYL (SUBLIMAZE) injection 25 mcg (has no administration in time range)  acetaminophen (TYLENOL) tablet 650 mg (650 mg Oral Given 09/23/21 0939)  oxyCODONE (Oxy IR/ROXICODONE) immediate release tablet 2.5  mg (2.5 mg Oral Given 09/23/21 0940)    Pertinent labs & imaging results that were available during my care of the patient were reviewed by me and considered in my medical decision making (see chart for details).   _________________________________________   FINAL CLINICAL IMPRESSION(S) / ED DIAGNOSES  Final diagnoses:  Closed displaced fracture of surgical neck of left humerus, unspecified fracture morphology, initial encounter  Closed displaced fracture of left femoral neck [facility])    ED Discharge Orders     None        If  controlled substance prescribed during this visit, 12 month history viewed on the [place] prior to issuing an initial prescription for Schedule II or III opiod.    Victorino Dike, FNP 09/23/21 1117    Merlyn Lot, MD 09/23/21 1539

## 2021-09-23 NOTE — Op Note (Signed)
09/23/2021  3:59 PM  PATIENT:  Kaleiah A Inthavong  87 y.o. female  PRE-OPERATIVE DIAGNOSIS:  Left Hip Fracture, displaced femoral neck  POST-OPERATIVE DIAGNOSIS:  Left Hip Fracture displaced femoral neck  PROCEDURE:  Procedure(s): ARTHROPLASTY BIPOLAR HIP (HEMIARTHROPLASTY) (Left)  SURGEON: Laurene Footman, MD  ASSISTANTS: None  ANESTHESIA:   spinal  EBL:  Total I/O In: 750 [I.V.:700; IV Piggyback:50] Out: 200 [Urine:100; Blood:100]  BLOOD ADMINISTERED:none  DRAINS:  Incisional wound VAC    LOCAL MEDICATIONS USED:  NONE  SPECIMEN: Left femoral head and neck fragments  DISPOSITION OF SPECIMEN:  PATHOLOGY  COUNTS:  YES  TOURNIQUET:  * No tourniquets in log *  IMPLANTS: Medacta cemented 2 AMIS stem with S metal 28 mm head and 45 mm bipolar head  DICTATION: .Dragon Dictation   The patient was brought to the operating room and after spinal anesthesia was obtained patient was placed on the operative table with the ipsilateral foot into the Medacta attachment, contralateral leg on a well-padded table. C-arm was brought in and preop template x-ray taken. After prepping and draping in usual sterile fashion appropriate patient identification and timeout procedures were completed. Anterior approach to the hip was obtained and centered over the greater trochanter and TFL muscle. The subcutaneous tissue was incised hemostasis being achieved by electrocautery. TFL fascia was incised and the muscle retracted laterally deep retractor placed. The lateral femoral circumflex vessels were identified and ligated. The anterior capsule was exposed and a capsulotomy performed.  There was displaced femoral neck fracture subcapital and tight.  The neck was identified and a femoral neck cut carried out with a saw. The head was removed without difficulty and showed very minimal degenerative changes to the femoral head and acetabulum.  Head sized to 45 and a 45 mm trial fit well.  The leg was then externally  rotated and ischiofemoral and pubofemoral releases carried out. The femur was sequentially broached to a size 3, and a size 3 cement plug was inserted with femoral canal preparation carried out.  After pressurizing the cement down the canal, the 2 standard stem was inserted along with a metal S 28 mm head and 45 mm bipolar head after the cemented entirely set.. The hip was reduced and was stable the wound was thoroughly irrigated with fibrillar placed along the posterior capsule and medial neck. The deep fascia ws closed using a heavy Quill.  Marland Kitchen3-0 V-loc to close the skin with skin staples.  Incisional wound VAC applied and patient was sent to recovery in stable condition.   PLAN OF CARE: Admit to inpatient

## 2021-09-23 NOTE — ED Notes (Signed)
Pt to OR now

## 2021-09-23 NOTE — Progress Notes (Signed)
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 On 12/1, she experienced a fall in her home and was transported by EMS to [facility]. She was found to have a displaced femoral neck fracture and nondisplaced left shoulder. Decision was made to proceed with surgical fixation of her left femoral neck fracture. This is a related hospital admission per Dr. Jewel Baize, [facility] MD.  Attempted to visit at the bedside, however patient had just been taken to OR. Report exchanged with hospital staff. Daughter in the waiting room, and updated on the plan of care.  She is appropriate for inpatient level of care due to needing surgical intervention to stabilize her femoral head fracture.   V/S: 97.8 temporal, 140/61, HR 88, RR 16, SPO2 97% on RA prior to surgery  I&O: 0/0 Labs: Na 132, calcium 8.5, WBC 12.6, RBC 3.29, hgb 9.8, HCT 29.8 IV/PRN: fentanyl 25 mcg IV x 1, oxy IR 2.5 mg PO x 1, cleocin 600 mg IV x 1, robaxin 500 mg IV x 1 Diagnostics: DG hip - Moderately displaced proximal left femoral neck fracture  Problem List: - left femoral head fracture - currently in OR for hemiarthroplasty - left humeral fracture - supportive care - leukocytosis - unclear etiology, CT chest shows coarse interstitial opacities in bilateral lungs - dementia - continue home medications - anemia - chronic in nature  GOC: Remains a DNR, focus is managing her discomfort Discharge planning: Likely return home with hospice support Family: At the bedside/waiting room  IDT hospice team: updated  Thank you, Venia Carbon BSN, RN [facility] Liaison

## 2021-09-23 NOTE — Consult Note (Signed)
Reason for Consult:left femoral neck fracture Referring Physician:   Dr Filomena Jungling Angel Brandt is an 87 y.o. female.  HPI:  Patient is Angel 87 year old who had Angel prior right hip fracture 2 years ago.  Admitted for treatment of this.  Her daughter reports that she is usually quite ambulatory with his days when she is mostly just sits.  She suffered Angel fall earlier today and injured her left shoulder and hip found to have Angel displaced femoral neck fracture nondisplaced x-ray left shoulder.  Patient is unable to give any information she has significant confusion  Past Medical History:  Diagnosis Date   At high risk for falls    Blind    Breast cancer ([place])    Cancer ([place])    Sherran Needs syndrome 2017   GERD (gastroesophageal reflux disease)    Hearing impaired    Macular degeneration    Malaria    Osteoporosis     Past Surgical History:  Procedure Laterality Date   BREAST LUMPECTOMY     BREAST SURGERY     INTRAMEDULLARY (IM) NAIL INTERTROCHANTERIC Right 04/30/2019   Procedure: INTRAMEDULLARY (IM) NAIL INTERTROCHANTRIC;  Surgeon: Thornton Park, MD;  Location: [facility] ORS;  Service: Orthopedics;  Laterality: Right;   IRRIGATION AND DEBRIDEMENT FOOT Right 01/09/2020   Procedure: IRRIGATION AND DEBRIDEMENT FOOT;  Surgeon: Sharlotte Alamo, DPM;  Location: [facility] ORS;  Service: Podiatry;  Laterality: Right;   LOWER EXTREMITY ANGIOGRAPHY Right 01/08/2020   Procedure: Lower Extremity Angiography;  Surgeon: Katha Cabal, MD;  Location: [place] [facility];  Service: Cardiovascular;  Laterality: Right;    History reviewed. No pertinent family history.  Social History:  reports that she has never smoked. Her smokeless tobacco use includes snuff. She reports that she does not drink alcohol and does not use drugs.  Allergies:  Allergies  Allergen Reactions   Codeine Anaphylaxis and Other (See Comments)   Penicillins Anaphylaxis and Nausea And Vomiting    Other reaction(s): Unknown   Aspirin  Nausea And Vomiting and Other (See Comments)    unknown   Codeine Sulfate Nausea And Vomiting   Influenza Virus Vaccine     Other reaction(s): Unknown   Other Other (See Comments)   Sulfa Antibiotics Other (See Comments) and Nausea And Vomiting    unknown   Tetanus-Diphth-Acell Pertussis     Other reaction(s): Unknown   Ativan [Lorazepam] Other (See Comments)    Agitation (can take low doses)    Medications: I have reviewed the patient's current medications.  Results for orders placed or performed during the hospital encounter of 09/23/21 (from the past 48 hour(s))  Resp Panel by RT-PCR (Flu Angel&B, Covid) Nasopharyngeal Swab     Status: None   Collection Time: 09/23/21 10:54 AM   Specimen: Nasopharyngeal Swab; Nasopharyngeal(NP) swabs in vial transport medium  Result Value Ref Range   SARS Coronavirus 2 by RT PCR NEGATIVE NEGATIVE    Comment: (NOTE) SARS-CoV-2 target nucleic acids are NOT DETECTED.  The SARS-CoV-2 RNA is generally detectable in upper respiratory specimens during the acute phase of infection. The lowest concentration of SARS-CoV-2 viral copies this assay can detect is 138 copies/mL. Angel negative result does not preclude SARS-Cov-2 infection and should not be used as the sole basis for treatment or other patient management decisions. Angel negative result [place] occur with  improper specimen collection/handling, submission of specimen other than nasopharyngeal swab, presence of viral mutation(s) within the areas targeted by this assay, and inadequate number of  viral copies(<138 copies/mL). Angel negative result must be combined with clinical observations, patient history, and epidemiological information. The expected result is Negative.  Fact Sheet for Patients:  EntrepreneurPulse.com.au  Fact Sheet for Healthcare Providers:  IncredibleEmployment.be  This test is no t yet approved or cleared by the [place] FDA and  has been  authorized for detection and/or diagnosis of SARS-CoV-2 by FDA under an Emergency Use Authorization (EUA). This EUA will remain  in effect (meaning this test can be used) for the duration of the COVID-19 declaration under Section 564(b)(1) of the Act, 21 U.S.C.section 360bbb-3(b)(1), unless the authorization is terminated  or revoked sooner.       Influenza Angel by PCR NEGATIVE NEGATIVE   Influenza B by PCR NEGATIVE NEGATIVE    Comment: (NOTE) The Xpert Xpress SARS-CoV-2/FLU/RSV plus assay is intended as an aid in the diagnosis of influenza from Nasopharyngeal swab specimens and should not be used as Angel sole basis for treatment. Nasal washings and aspirates are unacceptable for Xpert Xpress SARS-CoV-2/FLU/RSV testing.  Fact Sheet for Patients: EntrepreneurPulse.com.au  Fact Sheet for Healthcare Providers: IncredibleEmployment.be  This test is not yet approved or cleared by the [place] FDA and has been authorized for detection and/or diagnosis of SARS-CoV-2 by FDA under an Emergency Use Authorization (EUA). This EUA will remain in effect (meaning this test can be used) for the duration of the COVID-19 declaration under Section 564(b)(1) of the Act, 21 U.S.C. section 360bbb-3(b)(1), unless the authorization is terminated or revoked.  Performed at [facility], [address] [place], [place] [address]   Basic metabolic panel     Status: Abnormal   Collection Time: 09/23/21 11:10 AM  Result Value Ref Range   Sodium 132 (L) 135 - 145 mmol/L   Potassium 3.8 3.5 - 5.1 mmol/L   Chloride 100 98 - 111 mmol/L   CO2 25 22 - 32 mmol/L   Glucose, Bld 104 (H) 70 - 99 mg/dL    Comment: Glucose reference range applies only to samples taken after fasting for at least 8 hours.   BUN 22 8 - 23 mg/dL   Creatinine, Ser 0.78 0.44 - 1.00 mg/dL   Calcium 8.5 (L) 8.9 - 10.3 mg/dL   GFR, Estimated >60 >60 mL/min    Comment: (NOTE) Calculated  using the CKD-EPI Creatinine Equation (2021)    Anion gap 7 5 - 15    Comment: Performed at [facility], [address] [place], [place] [address]  CBC with Differential     Status: Abnormal   Collection Time: 09/23/21 11:10 AM  Result Value Ref Range   WBC 12.6 (H) 4.0 - 10.5 K/uL   RBC 3.29 (L) 3.87 - 5.11 MIL/uL   Hemoglobin 9.8 (L) 12.0 - 15.0 g/dL   HCT 29.8 (L) 36.0 - 46.0 %   MCV 90.6 80.0 - 100.0 fL   MCH 29.8 26.0 - 34.0 pg   MCHC 32.9 30.0 - 36.0 g/dL   RDW 13.5 11.5 - 15.5 %   Platelets 236 150 - 400 K/uL   nRBC 0.0 0.0 - 0.2 %   Neutrophils Relative % 89 %   Neutro Abs 11.2 (H) 1.7 - 7.7 K/uL   Lymphocytes Relative 6 %   Lymphs Abs 0.7 0.7 - 4.0 K/uL   Monocytes Relative 4 %   Monocytes Absolute 0.6 0.1 - 1.0 K/uL   Eosinophils Relative 0 %   Eosinophils Absolute 0.0 0.0 - 0.5 K/uL   Basophils Relative 0 %  Basophils Absolute 0.1 0.0 - 0.1 K/uL   Immature Granulocytes 1 %   Abs Immature Granulocytes 0.07 0.00 - 0.07 K/uL    Comment: Performed at [facility], [address] [place], [place] [address]  Type and screen [place]     Status: None (Preliminary result)   Collection Time: 09/23/21 11:10 AM  Result Value Ref Range   ABO/RH(D) B POS    Antibody Screen NEG    Sample Expiration      09/26/2021,2359 Performed at [facility], [address] [place], [place] [address]    Antibody Identification PENDING   Urinalysis, Routine w reflex microscopic Urine, Clean Catch     Status: Abnormal   Collection Time: 09/23/21 11:56 AM  Result Value Ref Range   Color, Urine YELLOW YELLOW   APPearance HAZY (Angel) CLEAR   Specific Gravity, Urine 1.020 1.005 - 1.030   pH 6.5 5.0 - 8.0   Glucose, UA NEGATIVE NEGATIVE mg/dL   Hgb urine dipstick NEGATIVE NEGATIVE   Bilirubin Urine NEGATIVE NEGATIVE   Ketones, ur NEGATIVE NEGATIVE mg/dL   Protein, ur NEGATIVE NEGATIVE mg/dL   Nitrite POSITIVE (Angel) NEGATIVE    Leukocytes,Ua NEGATIVE NEGATIVE    Comment: Performed at [facility], [address] [place], [place] [address]  Urinalysis, Microscopic (reflex)     Status: Abnormal   Collection Time: 09/23/21 11:56 AM  Result Value Ref Range   RBC / HPF 6-10 0 - 5 RBC/hpf   WBC, UA 6-10 0 - 5 WBC/hpf   Bacteria, UA MANY (Angel) NONE SEEN   Squamous Epithelial / LPF 0-5 0 - 5    Comment: Performed at [facility], [address] [place], [place] [address]    DG Chest 1 View  Result Date: 09/23/2021 CLINICAL DATA:  Preop for hip fracture EXAM: CHEST  1 VIEW COMPARISON:  Chest radiograph 04/29/2019 FINDINGS: The heart is at the upper limits of normal for size. Upper mediastinal contours are stable. There are coarse reticular opacities predominantly in the lung apices, right worse than left. These have significantly worsened since 2020. There is no significant pleural effusion. There is no pneumothorax. Angel fracture of the proximal left humerus is again seen, better evaluated on same-day shoulder radiographs. IMPRESSION: Coarse interstitial opacities in both lungs, predominantly in the apices and significantly worse on the right consistent with interstitial lung disease, progressed since 04/29/2019. Superimposed infection would be difficult to exclude. Electronically Signed   By: Valetta Mole M.D.   On: 09/23/2021 11:05   CT HEAD WO CONTRAST (5MM)  Result Date: 09/23/2021 CLINICAL DATA:  Trauma EXAM: CT HEAD WITHOUT CONTRAST CT CERVICAL SPINE WITHOUT CONTRAST TECHNIQUE: Multidetector CT imaging of the head and cervical spine was performed following the standard protocol without intravenous contrast. Multiplanar CT image reconstructions of the cervical spine were also generated. COMPARISON:  Head CT dated July 04/13/2019 FINDINGS: CT HEAD FINDINGS Brain: Chronic white matter ischemic change. Generalized atrophy which is likely age related. No evidence of acute infarction, hemorrhage, hydrocephalus,  extra-axial collection or mass lesion/mass effect. Vascular: No hyperdense vessel or unexpected calcification. Skull: Normal. Negative for fracture or focal lesion. Sinuses/Orbits: No acute finding. Other: None. CT CERVICAL SPINE FINDINGS Alignment: Normal. Skull base and vertebrae: Evaluation is limited due to motion artifact. No definite fracture. Soft tissues and spinal canal: No prevertebral fluid or swelling. No visible canal hematoma. Disc levels:  Multilevel moderate degenerative disc disease. Upper chest: Bilateral bronchiectasis and reticulonodular opacities.  Other: None IMPRESSION: 1. No acute intracranial abnormality. 2. No CT evidence of acute cervical spine injury. 3. Bronchiectasis and reticulonodular opacities seen at the bilateral lung apices, possibly due to chronic atypical infection. Findings could be further evaluated with dedicated chest CT. Electronically Signed   By: Yetta Glassman M.D.   On: 09/23/2021 08:15   CT Cervical Spine Wo Contrast  Result Date: 09/23/2021 CLINICAL DATA:  Trauma EXAM: CT HEAD WITHOUT CONTRAST CT CERVICAL SPINE WITHOUT CONTRAST TECHNIQUE: Multidetector CT imaging of the head and cervical spine was performed following the standard protocol without intravenous contrast. Multiplanar CT image reconstructions of the cervical spine were also generated. COMPARISON:  Head CT dated July 04/13/2019 FINDINGS: CT HEAD FINDINGS Brain: Chronic white matter ischemic change. Generalized atrophy which is likely age related. No evidence of acute infarction, hemorrhage, hydrocephalus, extra-axial collection or mass lesion/mass effect. Vascular: No hyperdense vessel or unexpected calcification. Skull: Normal. Negative for fracture or focal lesion. Sinuses/Orbits: No acute finding. Other: None. CT CERVICAL SPINE FINDINGS Alignment: Normal. Skull base and vertebrae: Evaluation is limited due to motion artifact. No definite fracture. Soft tissues and spinal canal: No prevertebral fluid  or swelling. No visible canal hematoma. Disc levels:  Multilevel moderate degenerative disc disease. Upper chest: Bilateral bronchiectasis and reticulonodular opacities. Other: None IMPRESSION: 1. No acute intracranial abnormality. 2. No CT evidence of acute cervical spine injury. 3. Bronchiectasis and reticulonodular opacities seen at the bilateral lung apices, possibly due to chronic atypical infection. Findings could be further evaluated with dedicated chest CT. Electronically Signed   By: Yetta Glassman M.D.   On: 09/23/2021 08:15   DG Shoulder Left  Result Date: 09/23/2021 CLINICAL DATA:  Fall.  Pain. EXAM: LEFT SHOULDER - 2+ VIEW COMPARISON:  None. FINDINGS: Three views study shows Angel minimally displaced and angulated fracture of the left humeral neck. Acromioclavicular and coracoclavicular distances are preserved. Bones are markedly osteopenic. IMPRESSION: Minimally displaced and angulated fracture of the left humeral neck. Marked bony demineralization. Electronically Signed   By: Misty Stanley M.D.   On: 09/23/2021 07:42   DG Hip Unilat W or Wo Pelvis 2-3 Views Left  Result Date: 09/23/2021 CLINICAL DATA:  Left hip pain after fall. EXAM: DG HIP (WITH OR WITHOUT PELVIS) 2-3V LEFT COMPARISON:  December 01, 2015. FINDINGS: Moderately displaced proximal left femoral neck fracture. IMPRESSION: Moderately displaced proximal left femoral neck fracture. Electronically Signed   By: Marijo Conception M.D.   On: 09/23/2021 10:13    Review of Systems Blood pressure (!) 150/73, pulse 68, temperature 98.4 F (36.9 C), temperature source Oral, resp. rate 18, SpO2 99 %. Physical Exam Left leg shortened, skin intact Assessment/Plan: Displaced left femoral neck fracture, plan hip hemiarthoplasty Nonsurgical treatment of left shoulder fracture  Hessie Knows 09/23/2021, 12:41 PM

## 2021-09-23 NOTE — ED Triage Notes (Signed)
Pt arrived via [facility] from home where she had unwitnessed fall this AM when she got up to go to bathroom without her walker. Pt with obvious deformity noted to the left shoulder. Pt with dementia, on hospice care.

## 2021-09-24 ENCOUNTER — Encounter: Payer: Self-pay | Admitting: Orthopedic Surgery

## 2021-09-24 DIAGNOSIS — S72002A Fracture of unspecified part of neck of left femur, initial encounter for closed fracture: Principal | ICD-10-CM

## 2021-09-24 DIAGNOSIS — E43 Unspecified severe protein-calorie malnutrition: Secondary | ICD-10-CM

## 2021-09-24 DIAGNOSIS — F0390 Unspecified dementia without behavioral disturbance: Secondary | ICD-10-CM

## 2021-09-24 DIAGNOSIS — Z515 Encounter for palliative care: Secondary | ICD-10-CM

## 2021-09-24 LAB — BASIC METABOLIC PANEL
Anion gap: 3 — ABNORMAL LOW (ref 5–15)
BUN: 24 mg/dL — ABNORMAL HIGH (ref 8–23)
CO2: 26 mmol/L (ref 22–32)
Calcium: 7.8 mg/dL — ABNORMAL LOW (ref 8.9–10.3)
Chloride: 102 mmol/L (ref 98–111)
Creatinine, Ser: 0.77 mg/dL (ref 0.44–1.00)
GFR, Estimated: >60 mL/min (ref >60)
Glucose, Bld: 139 mg/dL — ABNORMAL HIGH (ref 70–99)
Potassium: 4 mmol/L (ref 3.5–5.1)
Sodium: 131 mmol/L — ABNORMAL LOW (ref 135–145)

## 2021-09-24 LAB — CBC
HCT: 19.6 % — ABNORMAL LOW (ref 36.0–46.0)
Hemoglobin: 6.5 g/dL — ABNORMAL LOW (ref 12.0–15.0)
MCH: 29.4 pg (ref 26.0–34.0)
MCHC: 33.2 g/dL (ref 30.0–36.0)
MCV: 88.7 fL (ref 80.0–100.0)
Platelets: 154 10*3/uL (ref 150–400)
RBC: 2.21 MIL/uL — ABNORMAL LOW (ref 3.87–5.11)
RDW: 13.9 % (ref 11.5–15.5)
WBC: 6.3 10*3/uL (ref 4.0–10.5)
nRBC: 0 % (ref 0.0–0.2)

## 2021-09-24 LAB — HEMOGLOBIN AND HEMATOCRIT, BLOOD
HCT: 26.7 % — ABNORMAL LOW (ref 36.0–46.0)
Hemoglobin: 9 g/dL — ABNORMAL LOW (ref 12.0–15.0)

## 2021-09-24 LAB — PREPARE RBC (CROSSMATCH)

## 2021-09-24 MED ORDER — GLYCOPYRROLATE 0.2 MG/ML IJ SOLN
0.2000 mg | INTRAMUSCULAR | Status: DC | PRN
  Filled 2021-09-24: qty 1

## 2021-09-24 MED ORDER — FENTANYL CITRATE PF 50 MCG/ML IJ SOSY: 25.0000 ug | PREFILLED_SYRINGE | INTRAMUSCULAR | Status: DC | PRN

## 2021-09-24 MED ORDER — ENOXAPARIN SODIUM 30 MG/0.3ML IJ SOSY
30.0000 mg | PREFILLED_SYRINGE | INTRAMUSCULAR | Status: DC
  Administered 2021-09-24: 30 mg via SUBCUTANEOUS
  Filled 2021-09-24: qty 0.3

## 2021-09-24 MED ORDER — ACETAMINOPHEN 650 MG RE SUPP: 650.0000 mg | Freq: Four times a day (QID) | RECTAL | Status: DC | PRN

## 2021-09-24 MED ORDER — HALOPERIDOL LACTATE 5 MG/ML IJ SOLN: 2.5000 mg | INTRAMUSCULAR | Status: DC | PRN

## 2021-09-24 MED ORDER — ACETAMINOPHEN 325 MG PO TABS
650.0000 mg | ORAL_TABLET | Freq: Four times a day (QID) | ORAL | Status: DC | PRN
  Administered 2021-09-24 – 2021-09-25 (×2): 650 mg via ORAL
  Filled 2021-09-24 (×2): qty 2

## 2021-09-24 MED ORDER — GLYCOPYRROLATE 1 MG PO TABS
1.0000 mg | ORAL_TABLET | ORAL | Status: DC | PRN
  Filled 2021-09-24: qty 1

## 2021-09-24 MED ORDER — SODIUM CHLORIDE 0.9% IV SOLUTION: Freq: Once | INTRAVENOUS | Status: AC

## 2021-09-24 MED ORDER — ENOXAPARIN SODIUM 30 MG/0.3ML IJ SOSY: 30.0000 mg | PREFILLED_SYRINGE | INTRAMUSCULAR | 0 refills | Status: DC

## 2021-09-24 MED ORDER — POLYVINYL ALCOHOL 1.4 % OP SOLN
1.0000 [drp] | Freq: Four times a day (QID) | OPHTHALMIC | Status: DC | PRN
  Filled 2021-09-24: qty 15

## 2021-09-24 NOTE — Progress Notes (Signed)
PHARMACIST - PHYSICIAN COMMUNICATION  CONCERNING:  Enoxaparin (Lovenox) for DVT Prophylaxis    RECOMMENDATION: Patient was prescribed enoxaprin 40mg  q24 hours for VTE prophylaxis.   Filed Weights   09/23/21 1732  Weight: 44 kg (97 lb)    Body mass index is 17.18 kg/m.  Estimated Creatinine Clearance: 29.2 mL/min (by C-G formula based on SCr of 0.77 mg/dL).  Patient is candidate for enoxaparin 30mg  every 24 hours based on CrCl <62ml/min or Weight <45kg  DESCRIPTION: Pharmacy has adjusted enoxaparin dose per [facility] policy.  Patient is now receiving enoxaparin 30 mg every 24 hours   Renda Rolls, PharmD, [facility] 09/24/2021 4:19 AM

## 2021-09-24 NOTE — Progress Notes (Signed)
Pt foley removed at 0630. Family made aware that pt needs to void within 6 hour period. Will notify incoming shift. Will continue to monitor.

## 2021-09-24 NOTE — Assessment & Plan Note (Signed)
Comfort care now.  Patient actively dying.  Waiting for hospice home bed

## 2021-09-24 NOTE — Progress Notes (Signed)
Palliative:  Consult received for goals of care. Per chart review, patient is a current hospice patient and goals of care have been addressed. Please refer to note by hospice liaison Venia Carbon dated 09/23/21. Her note reviews goals of care as : "GOC: Remains a DNR, focus is managing her discomfort Discharge planning: Likely return home with hospice support Family: At the bedside/waiting room  IDT hospice team: updated" She also notes "Decision was made to proceed with surgical fixation of her left femoral neck fracture. This is a related hospital admission per Dr. Jewel Baize, [facility] MD."  No needs for palliative team identified. Please reach out if palliative involvement is needed.   Juel Burrow, DNP, AGNP-C Palliative Medicine Team Team Phone # [phone]  Pager # [phone]  NO CHARGE

## 2021-09-24 NOTE — Progress Notes (Signed)
Nutrition Brief Note  Chart reviewed. RD consulted as part of the hip fracture protocol.  Pt now transitioning to comfort care.   No further nutrition interventions planned at this time.   Please re-consult if/as needed.   Derrel Nip, RD, LDN (she/her/hers) Clinical Inpatient Dietitian RD Pager/After-Hours/Weekend Pager # in [place]

## 2021-09-24 NOTE — Progress Notes (Signed)
  Progress Note    Angel Brandt   TAV:[id number]  DOB: 03/12/1924  DOA: 09/23/2021     1 Date of Service: 09/24/2021   Clinical Course 87 year old female with a known history of severe dementia, macular degeneration, legal blindness, hearing impaired is admitted for left hip fracture/displaced femoral neck status post fall.  Patient is under hospice services  12/1 left hip repair 12/2 -patient actively dying and appropriate for hospice home and waiting for bed   Assessment and Plan * Closed displaced fracture of left femoral neck ([place]) S/p repair  Dementia without behavioral disturbance ([place]) Comfort care now.  Patient actively dying.  Waiting for hospice home bed  Hospice care patient Followed by a [facility] care.  Patient is appropriate for hospice home but no beds available.  She is actively dying  Anemia of chronic disease Comfort care now.  Patient actively dying.  Waiting for hospice home bed  Protein-calorie malnutrition, severe Comfort care now.  Patient actively dying.  Waiting for hospice home bed  Degeneration macular Comfort care now.  Patient actively dying.  Waiting for hospice home bed     Subjective:  Patient is actively dying  Objective Vitals:   09/24/21 1134 09/24/21 1139 09/24/21 1208 09/24/21 1408  BP: (!) 140/52 (!) 140/52 (!) 148/53 (!) 145/78  Pulse: 92 92 93 98  Resp: 17 17 16 17   Temp: 98.5 F (36.9 C) 98.5 F (36.9 C) 99.9 F (37.7 C) 99.8 F (37.7 C)  TempSrc:  Oral  Axillary  SpO2:  98% 97% 93%  Weight:      Height:       44 kg  Vital signs were reviewed and unremarkable.   Exam Physical Exam   87 year old female lying in the bed comfortably and actively dying Respi -Chyne stocks respiration and apneic spells Cardiovascular: S1-S2 normal, Neuro: Unresponsive  Labs / Other Information There are no new results to review at this time.   Disposition Plan: Status is: Inpatient  Remains inpatient appropriate because:  Patient actively dying.  Waiting for hospice of bed    Updated daughter over phone    Time spent: 25 minutes [facility] 09/24/2021, 2:58 PM

## 2021-09-24 NOTE — Progress Notes (Signed)
   Subjective: 1 Day Post-Op Procedure(s) (LRB): ARTHROPLASTY BIPOLAR HIP (HEMIARTHROPLASTY) (Left) Patient sleeping this morning.  Daughter is present.  Patient has history of dementia at baseline.  Hemoglobin noted to be 6.5, 1 unit of packed red blood cells have been ordered.   Objective: Vital signs in last 24 hours: Temp:  [97.6 F (36.4 C)-99.2 F (37.3 C)] 99.2 F (37.3 C) (12/02 0530) Pulse Rate:  [75-104] 87 (12/02 0736) Resp:  [11-22] 18 (12/02 0736) BP: (93-250)/(40-200) 128/46 (12/02 0736) SpO2:  [94 %-100 %] 97 % (12/02 0736) Weight:  [44 kg] 44 kg (12/01 1732)  Intake/Output from previous day: 12/01 0701 - 12/02 0700 In: 2271.7 [P.O.:320; I.V.:1901.7; IV Piggyback:50] Out: 315 [Urine:215; Blood:100] Intake/Output this shift: No intake/output data recorded.  Recent Labs    09/23/21 1110 09/24/21 0229  HGB 9.8* 6.5*   Recent Labs    09/23/21 1110 09/24/21 0229  WBC 12.6* 6.3  RBC 3.29* 2.21*  HCT 29.8* 19.6*  PLT 236 154   Recent Labs    09/23/21 1110 09/24/21 0229  NA 132* 131*  K 3.8 4.0  CL 100 102  CO2 25 26  BUN 22 24*  CREATININE 0.78 0.77  GLUCOSE 104* 139*  CALCIUM 8.5* 7.8*   No results for input(s): LABPT, INR in the last 72 hours.  EXAM General - Patient is sleeping Extremity - Intact pulses distally No cellulitis present Compartment soft Dressing - dressing C/D/I and no drainage, Praveena intact without drainage  Past Medical History:  Diagnosis Date   At high risk for falls    Blind    Breast cancer ([place])    Cancer ([place])    Sherran Needs syndrome 2017   GERD (gastroesophageal reflux disease)    Hearing impaired    Macular degeneration    Malaria    Osteoporosis     Assessment/Plan:   1 Day Post-Op Procedure(s) (LRB): ARTHROPLASTY BIPOLAR HIP (HEMIARTHROPLASTY) (Left) Principal Problem:   Closed displaced fracture of left femoral neck (HCC) Active Problems:   Degeneration macular   Anemia of chronic disease    Dementia without behavioral disturbance (HCC)  Estimated body mass index is 17.18 kg/m as calculated from the following:   Height as of this encounter: 5\' 3"  (1.6 m).   Weight as of this encounter: 44 kg. Advance diet Up with therapy Work on bowel movement Vital signs are stable Acute postop blood loss anemia -hemoglobin 6.5.  Transfuse 1 unit packed red blood cells Care management to assist with discharge  Patient will need follow-up with [facility] orthopedics in 2 weeks. Remove Praveena and apply honeycomb dressing 7 days following discharge from hospital. Patient will need Lovenox 40 mg subcu daily x14 days following discharge from hospital.  DVT Prophylaxis - Lovenox, Foot Pumps, and TED hose Weight-Bearing as tolerated to left leg   T. Rachelle Hora, PA-C [place] 09/24/2021, 8:16 AM

## 2021-09-24 NOTE — Progress Notes (Signed)
[facility]) Hospitalized Hospice Patient Visit   Angel Brandt is a current hospice patient with a terminal diagnosis of abnormal weight loss. On 12.1.22, she experienced a fall in her home and was transported by EMS to [facility]. She was found to have a displaced femoral neck fracture and nondisplaced left shoulder. Decision was made to proceed with surgical fixation of her left femoral neck fracture. This is a related hospital admission per Dr. Jewel Baize, [facility] MD.   Visited patient at bedside. Daughter had just left to run home for a short time. Patient did not arouse to voice or touch. Report exchanged with hospital care team. Patient has been minimally responsive today. Phone call to speak with daughter Angel Brandt. Per Angel Brandt, patient was more alert last night, but has only responded by moaning a few times today. After speaking with Dr. Manuella Ghazi about patient's decline, patient's daughter has decided to focus on comfort only at this time. Daughter would like patient to be transported to the [facility]. Per [facility] MD patient is eligible for the Hospice Home and plan is to transport patient to the Hospice Home once a bed is available. Unfortunately, there is not a bed available at the [facility] today. Daughter and [facility] manager aware that someone from [facility] will reach out once a bed is available.    Patient remains inpatient appropriate due to required surgery for femoral neck fracture, prophylactic IV antibiotics post operatively and need for blood transfusion   V/S: 99.8, 145/78, 98, 17, 93% on RA   I/O:  2271.7/315   Abnormal Labs:  Sodium: 131 (L) Glucose: 139 (H) BUN: 24 (H) Calcium: 7.8 (L) Anion gap: 3 (L) RBC: 2.21 (L) Hemoglobin: 6.5 (L) HCT: 19.6 (L)  Diagnostics:  DG HIP (WITH OR WITHOUT PELVIS) 2-3V LEFT (Post operative)   IMPRESSION: 1. Left hip replacement with normal alignment. 2. Appearance suggests possible fracture of the trochanteric region of the  femur.   IV/PRN: Cleocin 600 mg IV every 6 hours, blood transfusion, NS 100 ml/hr IV continuous   Problem List: * Closed displaced fracture of left femoral neck (HCC) S/p repair   Dementia without behavioral disturbance ([place]) Comfort care now.  Patient actively dying.  Waiting for hospice home bed   Hospice care patient Followed by a [facility].  Patient is appropriate for hospice home but no beds available.  She is actively dying   Anemia of chronic disease Comfort care now.  Patient actively dying.  Waiting for hospice home bed   Protein-calorie malnutrition, severe Comfort care now.  Patient actively dying.  Waiting for hospice home bed   Degeneration macular Comfort care now.  Patient actively dying.  Waiting for hospice home bed   Discharge Planning: Plan is for discharge to the Hospice Home once a bed is available.   Family Contact: Spoke with daughter Angel Brandt by phone.   IDT: Updated   Goals of Care: Clear, DNR   Please do not hesitate to call with any hospice related questions or concerns.    Thank you,    Bobbie "Loren Racer, [place], BSN [facility] Liaison [phone]

## 2021-09-24 NOTE — Progress Notes (Signed)
Responded to Order Requisition for EOL patient. No family present but spoke with attending who shared family will be present later today. Overnight chaplain will follow up to provide support if needed.

## 2021-09-24 NOTE — Hospital Course (Signed)
87 year old female with a known history of severe dementia, macular degeneration, legal blindness, hearing impaired is admitted for left hip fracture/displaced femoral neck status post fall.  Patient is under hospice services  12/1 left hip repair 12/2 -patient actively dying and appropriate for hospice home and waiting for bed

## 2021-09-24 NOTE — Plan of Care (Signed)
  Problem: Clinical Measurements: Goal: Ability to maintain clinical measurements within normal limits will improve Outcome: Progressing   Problem: Clinical Measurements: Goal: Will remain free from infection Outcome: Progressing   Problem: Coping: Goal: Level of anxiety will decrease Outcome: Progressing   Problem: Pain Managment: Goal: General experience of comfort will improve Outcome: Progressing   Problem: Safety: Goal: Ability to remain free from injury will improve Outcome: Progressing

## 2021-09-24 NOTE — TOC Progression Note (Signed)
Transition of Care [facility]) - Progression Note    Patient Details  Name: Angel Brandt MRN: [id number] Date of Birth: 1923-10-25  [facility]) CM/SW [place], RN Phone Number: 09/24/2021, 1:07 PM  Clinical Narrative:   This patient is followed by Hospice and being reviewed for Hospice facility bed, TOC to follow for any needs         Expected Discharge Plan and Services                                                 Social Determinants of Health (SDOH) Interventions    Readmission Risk Interventions No flowsheet data found.

## 2021-09-24 NOTE — Progress Notes (Addendum)
Met daughter bedside, gave space for storytelling, tears and provided the requested prayer. I will ask overnight chaplain to follow up for support if and when needed. Asked unit secretary to order a small comfort cart for refreshments.

## 2021-09-24 NOTE — Progress Notes (Signed)
PT Cancellation Note  Patient Details Name: Angel Brandt MRN: [id number] DOB: 16-May-1924   Cancelled Treatment:    Reason Eval/Treat Not Completed: Other (comment): Per Dr. Manuella Ghazi, pt comfort care at this time with PT/OT orders to be completed.  Will complete PT orders at this time but will reassess pt pending a change in status upon receipt of new PT orders.     Linus Salmons PT, DPT 09/24/21, 1:45 PM

## 2021-09-24 NOTE — Progress Notes (Signed)
OT Cancellation Note  Patient Details Name: Angel Brandt MRN: [id number] DOB: 1924-03-14   Cancelled Treatment:    Reason Eval/Treat Not Completed: Medical issues which prohibited therapy. OT order received and chart reviewed.Pt with hemoglobin of 6.5 and awaiting blood transfusion. Per secure chat with Dr. Manuella Ghazi, therapy to hold evaluations at this time. OT to re-attempt when pt is able to medically participate.  Darleen Crocker, [place], OTR/L , CBIS ascom [phone]  09/24/21, 9:33 AM

## 2021-09-24 NOTE — Anesthesia Postprocedure Evaluation (Signed)
Anesthesia Post Note  Patient: Angel Brandt  Procedure(s) Performed: ARTHROPLASTY BIPOLAR HIP (HEMIARTHROPLASTY) (Left: Hip)  Patient location during evaluation: Nursing Unit Anesthesia Type: Spinal Level of consciousness: oriented and awake and alert Pain management: pain level controlled Vital Signs Assessment: post-procedure vital signs reviewed and stable Respiratory status: spontaneous breathing and respiratory function stable Cardiovascular status: blood pressure returned to baseline and stable Postop Assessment: no headache, no backache, no apparent nausea or vomiting and patient able to bend at knees Anesthetic complications: no   No notable events documented.   Last Vitals:  Vitals:   09/24/21 0530 09/24/21 0736  BP: (!) 115/47 (!) 128/46  Pulse: 88 87  Resp: 17 18  Temp: 37.3 C   SpO2: 97% 97%    Last Pain:  Vitals:   09/24/21 0017  TempSrc: Oral  PainSc: Asleep                 Caryl Asp

## 2021-09-24 NOTE — Progress Notes (Signed)
PT Cancellation Note  Patient Details Name: Angel Brandt MRN: [id number] DOB: Jun 07, 1924   Cancelled Treatment:    Reason Eval/Treat Not Completed: Medical issues which prohibited therapy: Pt's Hgb 6.5 with transfusion pending.  Per Dr. Manuella Ghazi hold PT at this time.  Will attempt to see pt at a future date/time as medically appropriate.    Linus Salmons PT, DPT 09/24/21, 9:29 AM

## 2021-09-24 NOTE — Assessment & Plan Note (Signed)
S/p repair

## 2021-09-24 NOTE — Assessment & Plan Note (Signed)
Followed by a [place] care.  Patient is appropriate for hospice home but no beds available.  She is actively dying

## 2021-09-24 NOTE — Progress Notes (Signed)
OT Cancellation Note  Patient Details Name: Angel Brandt MRN: [id number] DOB: June 11, 1924   Cancelled Treatment:    Reason Eval/Treat Not Completed: Other (comment) Upon further chart review this afternoon, noted that pt is being followed by hospice services and has transitioned to comfort measures. Will complete OT order at this time. Thank you for allowing OT to participate in the care of this patient.   Gerrianne Scale, [place], OTR/L ascom [phone] 09/24/21, 1:47 PM

## 2021-09-25 DIAGNOSIS — E43 Unspecified severe protein-calorie malnutrition: Secondary | ICD-10-CM

## 2021-09-25 DIAGNOSIS — F0390 Unspecified dementia without behavioral disturbance: Secondary | ICD-10-CM

## 2021-09-25 NOTE — Progress Notes (Signed)
   Subjective: 2 Days Post-Op Procedure(s) (LRB): ARTHROPLASTY BIPOLAR HIP (HEMIARTHROPLASTY) (Left) Patient sleeping this morning.  Daughter is present.  Patient has history of dementia at baseline.  Hemoglobin noted to be 9.0, 1 unit of packed red blood cells was given yesterday.   Objective: Vital signs in last 24 hours: Temp:  [98.1 F (36.7 C)-100.6 F (38.1 C)] 100.6 F (38.1 C) (12/02 2044) Pulse Rate:  [88-98] 88 (12/02 2044) Resp:  [15-18] 18 (12/02 2044) BP: (134-148)/(52-81) 141/52 (12/02 2044) SpO2:  [93 %-98 %] 97 % (12/02 2044)  Intake/Output from previous day: 12/02 0701 - 12/03 0700 In: 982.8 [I.V.:644.8; Blood:338] Out: 0  Intake/Output this shift: No intake/output data recorded.  Recent Labs    09/23/21 1110 09/24/21 0229 09/24/21 1444  HGB 9.8* 6.5* 9.0*   Recent Labs    09/23/21 1110 09/24/21 0229 09/24/21 1444  WBC 12.6* 6.3  --   RBC 3.29* 2.21*  --   HCT 29.8* 19.6* 26.7*  PLT 236 154  --    Recent Labs    09/23/21 1110 09/24/21 0229  NA 132* 131*  K 3.8 4.0  CL 100 102  CO2 25 26  BUN 22 24*  CREATININE 0.78 0.77  GLUCOSE 104* 139*  CALCIUM 8.5* 7.8*   No results for input(s): LABPT, INR in the last 72 hours.  EXAM General - Patient is sleeping Extremity - Intact pulses distally No cellulitis present Compartment soft Dressing - dressing C/D/I and no drainage, Praveena intact without drainage  Past Medical History:  Diagnosis Date   At high risk for falls    Blind    Breast cancer ([place])    Cancer ([place])    Sherran Needs syndrome 2017   GERD (gastroesophageal reflux disease)    Hearing impaired    Macular degeneration    Malaria    Osteoporosis     Assessment/Plan:   2 Days Post-Op Procedure(s) (LRB): ARTHROPLASTY BIPOLAR HIP (HEMIARTHROPLASTY) (Left) Principal Problem:   Closed displaced fracture of left femoral neck (HCC) Active Problems:   Degeneration macular   Protein-calorie malnutrition, severe   Anemia  of chronic disease   Hospice care patient   Dementia without behavioral disturbance ([place])  Estimated body mass index is 17.18 kg/m as calculated from the following:   Height as of this encounter: 5\' 3"  (1.6 m).   Weight as of this encounter: 44 kg. Advance diet Up with therapy Work on bowel movement Vital signs are stable Acute postop blood loss anemia -hemoglobin 9.0 after 1 unit transfused of packed red blood cells Care management to assist with discharge  Patient will need follow-up with [facility] orthopedics in 2 weeks. Remove Praveena and apply honeycomb dressing 7 days following discharge from hospital. Patient will need Lovenox 40 mg subcu daily x14 days following discharge from hospital.  DVT Prophylaxis - Lovenox, Foot Pumps, and TED hose Weight-Bearing as tolerated to left leg   Reche Dixon, PA-C [place] 09/25/2021, 7:41 AM

## 2021-09-25 NOTE — TOC Transition Note (Signed)
Transition of Care [facility]) - CM/SW Discharge Note   Patient Details  Name: Angel Brandt MRN: [id number] Date of Birth: 06-Nov-1923  [facility] CM/SW Contact:  Harriet Masson, RN Phone Number:[phone] 09/25/2021, 11:25 AM   Clinical Narrative:    Received notification that a bed is available at [facility] hospice facility. Spoke with [place] at hospice who indicated pt will go to room #10 and report to be called to [phone]. The facility has requested a a transfer of this pt for night shift ar 9 PM. RN spoke with daughter [name] [phone]) with updated along with choice for [facility] for transport services. Contacted [facility] for pick up for transport at 9 pm tonight as requested by the facility. No other barriers to address at this time.  TOC will remain available for ongoing discharge needs.   Final next level of care: Other (comment) ([facility] (hospice facility)) Barriers to Discharge: No Barriers Identified   Patient Goals and CMS Choice        Discharge Placement                Patient to be transferred to facility by: EMS Name of family member notified: Alonza Bogus Patient and family notified of of transfer: 09/25/21  Discharge Plan and Services                                     Social Determinants of Health (SDOH) Interventions     Readmission Risk Interventions No flowsheet data found.

## 2021-09-25 NOTE — Discharge Summary (Signed)
Physician Discharge Summary  Angel Brandt NWG:[id number] DOB: [birth year] DOA: 09/23/2021  PCP: Maryland Pink, MD  Admit date: 09/23/2021 Discharge date: 09/25/2021  Admitted From: home  Disposition:  hospice home   Recommendations for Outpatient Follow-up:  F/u w/ hospice provider ASAP   Home Health: no Equipment/Devices:  Discharge Condition: stable  CODE STATUS: full  Diet recommendation: as tolerated  Brief/Interim Summary: HPI was taken from Dr. Francine Graven: Angel Brandt is a 87 y.o. female with medical history significant for dementia, macular degeneration, legal blindness, hearing impaired who was brought into the ER by EMS for evaluation after an unwitnessed fall at home. Patient's daughter states that she has had multiple falls recently due to the fact that she forgets that she needs a walker to ambulate and she gets up and falls. This morning she found her on the floor lying on her left side close to the bathroom at about 6 AM and patient was unable to get up due to severe pain involving her left hip and shoulder. I am unable to do a review of systems on this patient due to her underlying dementia. Sodium 132, potassium 3.8, chloride 100, bicarb 25, glucose 104, BUN 22, creatinine 0.70, calcium 8.5, white count 12.6, hemoglobin 9.8, hematocrit 29.8, MCV 90.6, RDW 13.5, platelet count 236 Respiratory viral panel is negative Chest x-ray reviewed by me shows coarse interstitial opacities in both lungs, predominantly in the apices and significantly worse on the right consistent with interstitial lung disease. Left hip x-ray shows moderately displaced proximal left femoral neck fracture. Left shoulder x-ray shows minimally displaced and angulated fracture of the left humeral neck. CT scan of the cervical spine without contrast shows no acute intracranial abnormality.  No CT evidence of acute cervical spine injury.  Bronchiectasis and reticulonodular opacities seen at the bilateral lung  apices possibly due to chronic atypical infection. CT scan of the head without contrast shows no evidence of acute infarction, hemorrhage, hydrocephalus or mass-effect.  Chronic white matter ischemic change. Twelve-lead EKG reviewed by me shows sinus tachycardia with a first-degree AV block.   ED Course: Patient is an 87 year old female who presents to the emergency room via EMS for evaluation of an unwitnessed fall and is found to have a left humeral and femoral neck fracture. She will be admitted to the hospital for further evaluation.  As per Dr. Carlynn Spry: 87 year old female with a known history of severe dementia, macular degeneration, legal blindness, hearing impaired is admitted for left hip fracture/displaced femoral neck status post fall.  Patient is under hospice services   12/1 left hip repair 12/2 -patient actively dying and appropriate for hospice home and waiting for bed  As per Dr. Jimmye Norman 09/25/21:  Pt was already on comfort care only. There is a bed at hospice home available so pt will be d/c to hospice home today.   Discharge Diagnoses:  Principal Problem:   Closed displaced fracture of left femoral neck (HCC) Active Problems:   Degeneration macular   Protein-calorie malnutrition, severe   Anemia of chronic disease   Hospice care patient   Dementia without behavioral disturbance ([place])  Closed displaced fracture of left femoral neck: s/p repair. Continue w/ comfort care    Dementia: continue w/ comfort care  Anemia of chronic disease: continue w/ comfort care    Protein-calorie malnutrition, severe: continue w/ comfort care   Degeneration macular: continue w/ comfort care  Discharge Instructions  Discharge Instructions     Diet general   Complete  by: As directed    As tolerated   Discharge instructions   Complete by: As directed    F/u w/ hospice provider ASAP   Increase activity slowly   Complete by: As directed    No wound care   Complete by: As  directed       Allergies as of 09/25/2021       Reactions   Codeine Anaphylaxis, Other (See Comments)   Penicillins Anaphylaxis, Nausea And Vomiting   Other reaction(s): Unknown   Aspirin Nausea And Vomiting, Other (See Comments)   unknown   Codeine Sulfate Nausea And Vomiting   Influenza Virus Vaccine    Other reaction(s): Unknown   Other Other (See Comments)   Sulfa Antibiotics Other (See Comments), Nausea And Vomiting   unknown   Tetanus-diphth-acell Pertussis    Other reaction(s): Unknown   Ativan [lorazepam] Other (See Comments)   Agitation (can take low doses)        Medication List     TAKE these medications    glycopyrrolate 1 MG tablet Commonly known as: ROBINUL Take 1 tablet (1 mg total) by mouth every 4 (four) hours as needed (excessive secretions).   mirtazapine 45 MG tablet Commonly known as: REMERON Take 45 mg by mouth at bedtime.   QUEtiapine 25 MG tablet Commonly known as: SEROQUEL Take 25 mg by mouth daily at 6 (six) AM.   QUEtiapine 50 MG tablet Commonly known as: SEROQUEL Take 50 mg by mouth every evening.   temazepam 15 MG capsule Commonly known as: RESTORIL Take 15 mg by mouth at bedtime.   traMADol 50 MG tablet Commonly known as: ULTRAM Take 50 mg by mouth every 6 (six) hours as needed for pain.        Follow-up Information     Duanne Guess, PA-C Follow up in 2 week(s).   Specialties: Orthopedic Surgery, Emergency Medicine Contact information: [place] [address] [phone]                Allergies  Allergen Reactions   Codeine Anaphylaxis and Other (See Comments)   Penicillins Anaphylaxis and Nausea And Vomiting    Other reaction(s): Unknown   Aspirin Nausea And Vomiting and Other (See Comments)    unknown   Codeine Sulfate Nausea And Vomiting   Influenza Virus Vaccine     Other reaction(s): Unknown   Other Other (See Comments)   Sulfa Antibiotics Other (See Comments) and Nausea  And Vomiting    unknown   Tetanus-Diphth-Acell Pertussis     Other reaction(s): Unknown   Ativan [Lorazepam] Other (See Comments)    Agitation (can take low doses)    Consultations: Hospice Ortho surg    Procedures/Studies: DG Chest 1 View  Result Date: 09/23/2021 CLINICAL DATA:  Preop for hip fracture EXAM: CHEST  1 VIEW COMPARISON:  Chest radiograph 04/29/2019 FINDINGS: The heart is at the upper limits of normal for size. Upper mediastinal contours are stable. There are coarse reticular opacities predominantly in the lung apices, right worse than left. These have significantly worsened since 2020. There is no significant pleural effusion. There is no pneumothorax. A fracture of the proximal left humerus is again seen, better evaluated on same-day shoulder radiographs. IMPRESSION: Coarse interstitial opacities in both lungs, predominantly in the apices and significantly worse on the right consistent with interstitial lung disease, progressed since 04/29/2019. Superimposed infection would be difficult to exclude. Electronically Signed   By: Valetta Mole M.D.   On:  09/23/2021 11:05   CT HEAD WO CONTRAST (5MM)  Result Date: 09/23/2021 CLINICAL DATA:  Trauma EXAM: CT HEAD WITHOUT CONTRAST CT CERVICAL SPINE WITHOUT CONTRAST TECHNIQUE: Multidetector CT imaging of the head and cervical spine was performed following the standard protocol without intravenous contrast. Multiplanar CT image reconstructions of the cervical spine were also generated. COMPARISON:  Head CT dated July 04/13/2019 FINDINGS: CT HEAD FINDINGS Brain: Chronic white matter ischemic change. Generalized atrophy which is likely age related. No evidence of acute infarction, hemorrhage, hydrocephalus, extra-axial collection or mass lesion/mass effect. Vascular: No hyperdense vessel or unexpected calcification. Skull: Normal. Negative for fracture or focal lesion. Sinuses/Orbits: No acute finding. Other: None. CT CERVICAL SPINE FINDINGS  Alignment: Normal. Skull base and vertebrae: Evaluation is limited due to motion artifact. No definite fracture. Soft tissues and spinal canal: No prevertebral fluid or swelling. No visible canal hematoma. Disc levels:  Multilevel moderate degenerative disc disease. Upper chest: Bilateral bronchiectasis and reticulonodular opacities. Other: None IMPRESSION: 1. No acute intracranial abnormality. 2. No CT evidence of acute cervical spine injury. 3. Bronchiectasis and reticulonodular opacities seen at the bilateral lung apices, possibly due to chronic atypical infection. Findings could be further evaluated with dedicated chest CT. Electronically Signed   By: Yetta Glassman M.D.   On: 09/23/2021 08:15   CT Cervical Spine Wo Contrast  Result Date: 09/23/2021 CLINICAL DATA:  Trauma EXAM: CT HEAD WITHOUT CONTRAST CT CERVICAL SPINE WITHOUT CONTRAST TECHNIQUE: Multidetector CT imaging of the head and cervical spine was performed following the standard protocol without intravenous contrast. Multiplanar CT image reconstructions of the cervical spine were also generated. COMPARISON:  Head CT dated July 04/13/2019 FINDINGS: CT HEAD FINDINGS Brain: Chronic white matter ischemic change. Generalized atrophy which is likely age related. No evidence of acute infarction, hemorrhage, hydrocephalus, extra-axial collection or mass lesion/mass effect. Vascular: No hyperdense vessel or unexpected calcification. Skull: Normal. Negative for fracture or focal lesion. Sinuses/Orbits: No acute finding. Other: None. CT CERVICAL SPINE FINDINGS Alignment: Normal. Skull base and vertebrae: Evaluation is limited due to motion artifact. No definite fracture. Soft tissues and spinal canal: No prevertebral fluid or swelling. No visible canal hematoma. Disc levels:  Multilevel moderate degenerative disc disease. Upper chest: Bilateral bronchiectasis and reticulonodular opacities. Other: None IMPRESSION: 1. No acute intracranial abnormality. 2. No  CT evidence of acute cervical spine injury. 3. Bronchiectasis and reticulonodular opacities seen at the bilateral lung apices, possibly due to chronic atypical infection. Findings could be further evaluated with dedicated chest CT. Electronically Signed   By: Yetta Glassman M.D.   On: 09/23/2021 08:15   DG Shoulder Left  Result Date: 09/23/2021 CLINICAL DATA:  Fall.  Pain. EXAM: LEFT SHOULDER - 2+ VIEW COMPARISON:  None. FINDINGS: Three views study shows a minimally displaced and angulated fracture of the left humeral neck. Acromioclavicular and coracoclavicular distances are preserved. Bones are markedly osteopenic. IMPRESSION: Minimally displaced and angulated fracture of the left humeral neck. Marked bony demineralization. Electronically Signed   By: Misty Stanley M.D.   On: 09/23/2021 07:42   DG C-Arm 1-60 Min-No Report  Result Date: 09/23/2021 Fluoroscopy was utilized by the requesting physician.  No radiographic interpretation.   DG HIP UNILAT WITH PELVIS 1V LEFT  Result Date: 09/23/2021 CLINICAL DATA:  Arthroplasty EXAM: DG HIP (WITH OR WITHOUT PELVIS) 1V*L* COMPARISON:  09/23/2021 FINDINGS: Three low resolution intraoperative spot views of the left hip. Total fluoroscopy time was 6 seconds. The images demonstrate a left hip replacement. There is questionable deformity or artifact  over the trochanteric region on the final 2 images. IMPRESSION: Intraoperative fluoroscopic assistance provided during left hip replacement surgery. Electronically Signed   By: Donavan Foil M.D.   On: 09/23/2021 16:04   DG HIP UNILAT W OR W/O PELVIS 2-3 VIEWS LEFT  Result Date: 09/23/2021 CLINICAL DATA:  Hip replacement EXAM: DG HIP (WITH OR WITHOUT PELVIS) 2-3V LEFT COMPARISON:  09/23/2021 FINDINGS: Interval left hip replacement with normal alignment. There appears to be a fracture involving the greater trochanter of the femur. Gas in the soft tissues consistent with recent surgery. IMPRESSION: 1. Left hip  replacement with normal alignment. 2. Appearance suggests possible fracture of the trochanteric region of the femur. Electronically Signed   By: Donavan Foil M.D.   On: 09/23/2021 17:59   DG Hip Unilat W or Wo Pelvis 2-3 Views Left  Result Date: 09/23/2021 CLINICAL DATA:  Left hip pain after fall. EXAM: DG HIP (WITH OR WITHOUT PELVIS) 2-3V LEFT COMPARISON:  December 01, 2015. FINDINGS: Moderately displaced proximal left femoral neck fracture. IMPRESSION: Moderately displaced proximal left femoral neck fracture. Electronically Signed   By: Marijo Conception M.D.   On: 09/23/2021 10:13   (Echo, Carotid, EGD, Colonoscopy, ERCP)    Subjective: Pt c/o fatigue    Discharge Exam: Vitals:   09/24/21 2044 09/25/21 0832  BP: (!) 141/52 127/71  Pulse: 88 82  Resp: 18 18  Temp: (!) 100.6 F (38.1 C) 97.8 F (36.6 C)  SpO2: 97% 99%   Vitals:   09/24/21 1408 09/24/21 1634 09/24/21 2044 09/25/21 0832  BP: (!) 145/78 140/66 (!) 141/52 127/71  Pulse: 98 89 88 82  Resp: 17 18 18 18   Temp: 99.8 F (37.7 C) 98.5 F (36.9 C) (!) 100.6 F (38.1 C) 97.8 F (36.6 C)  TempSrc: Axillary Oral    SpO2: 93% 97% 97% 99%  Weight:      Height:        General: Pt is alert, awake, not in acute distress. Frail appearing  Cardiovascular:  S1/S2 +, no rubs, no gallops Respiratory: diminished breath sounds b/l  Abdominal: Soft, NT, ND, bowel sounds + Extremities: no edema, no cyanosis    The results of significant diagnostics from this hospitalization (including imaging, microbiology, ancillary and laboratory) are listed below for reference.     Microbiology: Recent Results (from the past 240 hour(s))  Resp Panel by RT-PCR (Flu A&B, Covid) Nasopharyngeal Swab     Status: None   Collection Time: 09/23/21 10:54 AM   Specimen: Nasopharyngeal Swab; Nasopharyngeal(NP) swabs in vial transport medium  Result Value Ref Range Status   SARS Coronavirus 2 by RT PCR NEGATIVE NEGATIVE Final    Comment:  (NOTE) SARS-CoV-2 target nucleic acids are NOT DETECTED.  The SARS-CoV-2 RNA is generally detectable in upper respiratory specimens during the acute phase of infection. The lowest concentration of SARS-CoV-2 viral copies this assay can detect is 138 copies/mL. A negative result does not preclude SARS-Cov-2 infection and should not be used as the sole basis for treatment or other patient management decisions. A negative result Storie occur with  improper specimen collection/handling, submission of specimen other than nasopharyngeal swab, presence of viral mutation(s) within the areas targeted by this assay, and inadequate number of viral copies(<138 copies/mL). A negative result must be combined with clinical observations, patient history, and epidemiological information. The expected result is Negative.  Fact Sheet for Patients:  EntrepreneurPulse.com.au  Fact Sheet for Healthcare Providers:  IncredibleEmployment.be  This test is no t yet  approved or cleared by the [place] and  has been authorized for detection and/or diagnosis of SARS-CoV-2 by FDA under an Emergency Use Authorization (EUA). This EUA will remain  in effect (meaning this test can be used) for the duration of the COVID-19 declaration under Section 564(b)(1) of the Act, 21 U.S.C.section 360bbb-3(b)(1), unless the authorization is terminated  or revoked sooner.       Influenza A by PCR NEGATIVE NEGATIVE Final   Influenza B by PCR NEGATIVE NEGATIVE Final    Comment: (NOTE) The Xpert Xpress SARS-CoV-2/FLU/RSV plus assay is intended as an aid in the diagnosis of influenza from Nasopharyngeal swab specimens and should not be used as a sole basis for treatment. Nasal washings and aspirates are unacceptable for Xpert Xpress SARS-CoV-2/FLU/RSV testing.  Fact Sheet for Patients: EntrepreneurPulse.com.au  Fact Sheet for Healthcare  Providers: IncredibleEmployment.be  This test is not yet approved or cleared by the [place] FDA and has been authorized for detection and/or diagnosis of SARS-CoV-2 by FDA under an Emergency Use Authorization (EUA). This EUA will remain in effect (meaning this test can be used) for the duration of the COVID-19 declaration under Section 564(b)(1) of the Act, 21 U.S.C. section 360bbb-3(b)(1), unless the authorization is terminated or revoked.  Performed at [facility], [address] [place], [place] [address]      Labs: BNP (last 3 results) No results for input(s): BNP in the last 8760 hours. Basic Metabolic Panel: Recent Labs  Lab 09/23/21 1110 09/24/21 0229  NA 132* 131*  K 3.8 4.0  CL 100 102  CO2 25 26  GLUCOSE 104* 139*  BUN 22 24*  CREATININE 0.78 0.77  CALCIUM 8.5* 7.8*   Liver Function Tests: No results for input(s): AST, ALT, ALKPHOS, BILITOT, PROT, ALBUMIN in the last 168 hours. No results for input(s): LIPASE, AMYLASE in the last 168 hours. No results for input(s): AMMONIA in the last 168 hours. CBC: Recent Labs  Lab 09/23/21 1110 09/24/21 0229 09/24/21 1444  WBC 12.6* 6.3  --   NEUTROABS 11.2*  --   --   HGB 9.8* 6.5* 9.0*  HCT 29.8* 19.6* 26.7*  MCV 90.6 88.7  --   PLT 236 154  --    Cardiac Enzymes: No results for input(s): CKTOTAL, CKMB, CKMBINDEX, TROPONINI in the last 168 hours. BNP: Invalid input(s): POCBNP CBG: No results for input(s): GLUCAP in the last 168 hours. D-Dimer No results for input(s): DDIMER in the last 72 hours. Hgb A1c No results for input(s): HGBA1C in the last 72 hours. Lipid Profile No results for input(s): CHOL, HDL, LDLCALC, TRIG, CHOLHDL, LDLDIRECT in the last 72 hours. Thyroid function studies No results for input(s): TSH, T4TOTAL, T3FREE, THYROIDAB in the last 72 hours.  Invalid input(s): FREET3 Anemia work up No results for input(s): VITAMINB12, FOLATE, FERRITIN, TIBC,  IRON, RETICCTPCT in the last 72 hours. Urinalysis    Component Value Date/Time   COLORURINE YELLOW 09/23/2021 1156   APPEARANCEUR HAZY (A) 09/23/2021 1156   APPEARANCEUR Clear 08/21/2014 1938   LABSPEC 1.020 09/23/2021 1156   LABSPEC 1.006 08/21/2014 1938   PHURINE 6.5 09/23/2021 1156   GLUCOSEU NEGATIVE 09/23/2021 1156   GLUCOSEU Negative 08/21/2014 1938   HGBUR NEGATIVE 09/23/2021 [place] 09/23/2021 1156   BILIRUBINUR Negative 08/21/2014 1938   KETONESUR NEGATIVE 09/23/2021 1156   PROTEINUR NEGATIVE 09/23/2021 1156   NITRITE POSITIVE (A) 09/23/2021 1156   LEUKOCYTESUR NEGATIVE 09/23/2021 [place] 08/21/2014 1938  Sepsis Labs Invalid input(s): PROCALCITONIN,  WBC,  LACTICIDVEN Microbiology Recent Results (from the past 240 hour(s))  Resp Panel by RT-PCR (Flu A&B, Covid) Nasopharyngeal Swab     Status: None   Collection Time: 09/23/21 10:54 AM   Specimen: Nasopharyngeal Swab; Nasopharyngeal(NP) swabs in vial transport medium  Result Value Ref Range Status   SARS Coronavirus 2 by RT PCR NEGATIVE NEGATIVE Final    Comment: (NOTE) SARS-CoV-2 target nucleic acids are NOT DETECTED.  The SARS-CoV-2 RNA is generally detectable in upper respiratory specimens during the acute phase of infection. The lowest concentration of SARS-CoV-2 viral copies this assay can detect is 138 copies/mL. A negative result does not preclude SARS-Cov-2 infection and should not be used as the sole basis for treatment or other patient management decisions. A negative result [place] occur with  improper specimen collection/handling, submission of specimen other than nasopharyngeal swab, presence of viral mutation(s) within the areas targeted by this assay, and inadequate number of viral copies(<138 copies/mL). A negative result must be combined with clinical observations, patient history, and epidemiological information. The expected result is Negative.  Fact Sheet for  Patients:  EntrepreneurPulse.com.au  Fact Sheet for Healthcare Providers:  IncredibleEmployment.be  This test is no t yet approved or cleared by the [place] FDA and  has been authorized for detection and/or diagnosis of SARS-CoV-2 by FDA under an Emergency Use Authorization (EUA). This EUA will remain  in effect (meaning this test can be used) for the duration of the COVID-19 declaration under Section 564(b)(1) of the Act, 21 U.S.C.section 360bbb-3(b)(1), unless the authorization is terminated  or revoked sooner.       Influenza A by PCR NEGATIVE NEGATIVE Final   Influenza B by PCR NEGATIVE NEGATIVE Final    Comment: (NOTE) The Xpert Xpress SARS-CoV-2/FLU/RSV plus assay is intended as an aid in the diagnosis of influenza from Nasopharyngeal swab specimens and should not be used as a sole basis for treatment. Nasal washings and aspirates are unacceptable for Xpert Xpress SARS-CoV-2/FLU/RSV testing.  Fact Sheet for Patients: EntrepreneurPulse.com.au  Fact Sheet for Healthcare Providers: IncredibleEmployment.be  This test is not yet approved or cleared by the [place] FDA and has been authorized for detection and/or diagnosis of SARS-CoV-2 by FDA under an Emergency Use Authorization (EUA). This EUA will remain in effect (meaning this test can be used) for the duration of the COVID-19 declaration under Section 564(b)(1) of the Act, 21 U.S.C. section 360bbb-3(b)(1), unless the authorization is terminated or revoked.  Performed at [facility], [address], [place], [place] [address]      Time coordinating discharge: Over 30 minutes  SIGNED:   Wyvonnia Dusky, MD  [facility] 09/25/2021, 11:05 AM Pager   If 7PM-7AM, please contact night-coverage

## 2021-09-25 NOTE — Discharge Instructions (Addendum)
ANTERIOR APPROACH TOTAL HIP REPLACEMENT POSTOPERATIVE DIRECTIONS   Hip Rehabilitation, Guidelines Following Surgery  The results of a hip operation are greatly improved after range of motion and muscle strengthening exercises. Follow all safety measures which are given to protect your hip. If any of these exercises cause increased pain or swelling in your joint, decrease the amount until you are comfortable again. Then slowly increase the exercises. Call your caregiver if you have problems or questions.   HOME CARE INSTRUCTIONS  Remove items at home which could result in a fall. This includes throw rugs or furniture in walking pathways.  ICE to the affected hip every three hours for 30 minutes at a time and then as needed for pain and swelling.  Continue to use ice on the hip for pain and swelling from surgery. [name] notice swelling that will progress down to the foot and ankle.  This is normal after surgery.  Elevate the leg when you are not up walking on it.   Continue to use the breathing machine which will help keep your temperature down.  It is common for your temperature to cycle up and down following surgery, especially at night when you are not up moving around and exerting yourself.  The breathing machine keeps your lungs expanded and your temperature down. Do not place pillow under knee, focus on keeping the knee straight while resting  DIET [name] resume your previous home diet once your are discharged from the hospital.  DRESSING / WOUND CARE / SHOWERING The wound VAC will remain in place for 1 week after discharge.  When the wound VAC begins to sound the alarm, it can be discontinued and a regular bandage can be applied. Keep your dressing dry with showering.  You can keep it covered and pat dry. Change the surgical dressing daily and reapply a dry dressing each time.  ACTIVITY Walk with your walker as instructed. Use walker as long as suggested by your caregivers. Avoid  periods of inactivity such as sitting longer than an hour when not asleep. This helps prevent blood clots.  [name] resume a sexual relationship in one month or when given the OK by your doctor.  [name] return to work once you are cleared by your doctor.  Do not drive a car for 6 weeks or until released by you surgeon.  Do not drive while taking narcotics.  WEIGHT BEARING Weight bearing as tolerated with assist device (walker, cane, etc) as directed, use it as long as suggested by your surgeon or therapist, typically at least 4-6 weeks.  POSTOPERATIVE CONSTIPATION PROTOCOL Constipation - defined medically as fewer than three stools per week and severe constipation as less than one stool per week.  One of the most common issues patients have following surgery is constipation.  Even if you have a regular bowel pattern at home, your normal regimen is likely to be disrupted due to multiple reasons following surgery.  Combination of anesthesia, postoperative narcotics, change in appetite and fluid intake all can affect your bowels.  In order to avoid complications following surgery, here are some recommendations in order to help you during your recovery period.  Colace (docusate) - Pick up an over-the-counter form of Colace or another stool softener and take twice a day as long as you are requiring postoperative pain medications.  Take with a full glass of water daily.  If you experience loose stools or diarrhea, hold the colace until you stool forms back up.  If your symptoms do not get better within 1 week or if they get worse, check with your doctor.  Dulcolax (bisacodyl) - Pick up over-the-counter and take as directed by the product packaging as needed to assist with the movement of your bowels.  Take with a full glass of water.  Use this product as needed if not relieved by Colace only.   MiraLax (polyethylene glycol) - Pick up over-the-counter to have on hand.  MiraLax is a solution that will  increase the amount of water in your bowels to assist with bowel movements.  Take as directed and can mix with a glass of water, juice, soda, coffee, or tea.  Take if you go more than two days without a movement. Do not use MiraLax more than once per day. Call your doctor if you are still constipated or irregular after using this medication for 7 days in a row.  If you continue to have problems with postoperative constipation, please contact the office for further assistance and recommendations.  If you experience "the worst abdominal pain ever" or develop nausea or vomiting, please contact the office immediatly for further recommendations for treatment.  ITCHING  If you experience itching with your medications, try taking only a single pain pill, or even half a pain pill at a time.  You can also use Benadryl over the counter for itching or also to help with sleep.   TED HOSE STOCKINGS Wear the elastic stockings on both legs for three weeks following surgery during the day but [name] remove then at night for sleeping.  MEDICATIONS See your medication summary on the "After Visit Summary" that the nursing staff will review with you prior to discharge.  [name] have some home medications which will be placed on hold until you complete the course of blood thinner medication.  It is important for you to complete the blood thinner medication as prescribed by your surgeon.  Continue your approved medications as instructed at time of discharge.  PRECAUTIONS If you experience chest pain or shortness of breath - call 911 immediately for transfer to the hospital emergency department.  If you develop a fever greater that 101 F, purulent drainage from wound, increased redness or drainage from wound, foul odor from the wound/dressing, or calf pain - CONTACT YOUR SURGEON.                                                   FOLLOW-UP APPOINTMENTS Make sure you keep all of your appointments after your operation  with your surgeon and caregivers. You should call the office at the above phone number and make an appointment for approximately two weeks after the date of your surgery or on the date instructed by your surgeon outlined in the "After Visit Summary".  RANGE OF MOTION AND STRENGTHENING EXERCISES  These exercises are designed to help you keep full movement of your hip joint. Follow your caregiver's or physical therapist's instructions. Perform all exercises about fifteen times, three times per day or as directed. Exercise both hips, even if you have had only one joint replacement. These exercises can be done on a training (exercise) mat, on the floor, on a table or on a bed. Use whatever works the best and is most comfortable for you. Use music or television while you are exercising so that the  exercises are a pleasant break in your day. This will make your life better with the exercises acting as a break in routine you can look forward to.  Lying on your back, slowly slide your foot toward your buttocks, raising your knee up off the floor. Then slowly slide your foot back down until your leg is straight again.  Lying on your back spread your legs as far apart as you can without causing discomfort.  Lying on your side, raise your upper leg and foot straight up from the floor as far as is comfortable. Slowly lower the leg and repeat.  Lying on your back, tighten up the muscle in the front of your thigh (quadriceps muscles). You can do this by keeping your leg straight and trying to raise your heel off the floor. This helps strengthen the largest muscle supporting your knee.  Lying on your back, tighten up the muscles of your buttocks both with the legs straight and with the knee bent at a comfortable angle while keeping your heel on the floor.   IF YOU ARE TRANSFERRED TO A SKILLED REHAB FACILITY If the patient is transferred to a skilled rehab facility following release from the hospital, a list of the  current medications will be sent to the facility for the patient to continue.  When discharged from the skilled rehab facility, please have the facility set up the patient's [place] prior to being released. Also, the skilled facility will be responsible for providing the patient with their medications at time of release from the facility to include their pain medication, the muscle relaxants, and their blood thinner medication. If the patient is still at the rehab facility at time of the two week follow up appointment, the skilled rehab facility will also need to assist the patient in arranging follow up appointment in our office and any transportation needs.  MAKE SURE YOU:  Understand these instructions.  Get help right away if you are not doing well or get worse.    Pick up stool softner and laxative for home use following surgery while on pain medications. Do not submerge incision under water. Please use good hand washing techniques while changing dressing each day. Dickenson shower starting three days after surgery. Please use a clean towel to pat the incision dry following showers. Continue to use ice for pain and swelling after surgery. Do not use any lotions or creams on the incision until instructed by your surgeon.

## 2021-09-25 NOTE — Progress Notes (Signed)
Report given to Dan of [facility] facility, AVS anf packet placed in folder. Dan RN, asked to not remove iv access. Dtr.[name], was also notified.

## 2021-09-27 LAB — TYPE AND SCREEN
ABO/RH(D): B POS
Antibody Screen: POSITIVE
Unit division: 0
Unit division: 0
Unit division: 0
Unit division: 0

## 2021-09-27 LAB — BPAM RBC
Blood Product Expiration Date: 202212202359
Blood Product Expiration Date: 202212202359
Blood Product Expiration Date: 202212242359
Blood Product Expiration Date: 202212242359
ISSUE DATE / TIME: 202212021109
Unit Type and Rh: 7300
Unit Type and Rh: 7300
Unit Type and Rh: 7300
Unit Type and Rh: 7300

## 2021-09-28 LAB — SURGICAL PATHOLOGY

## 2021-10-24 DEATH — deceased

## 2022-04-08 IMAGING — CT CT CERVICAL SPINE W/O CM
3 of 7 series · 12 of 33 positions shown, 13 images · non-contrast
Comparison: Head CT dated [facility] 04/13/2019

CLINICAL DATA: Trauma

EXAM:
CT HEAD WITHOUT CONTRAST
CT CERVICAL SPINE WITHOUT CONTRAST
TECHNIQUE: Multidetector CT imaging of the head and cervical spine was
performed following the standard protocol without intravenous
contrast. Multiplanar CT image reconstructions of the cervical spine
were also generated.

[Series 6: coronal bone · coronal · 0.23mm/px · 3 of 85 slices shown]
[im 21/85  bone]
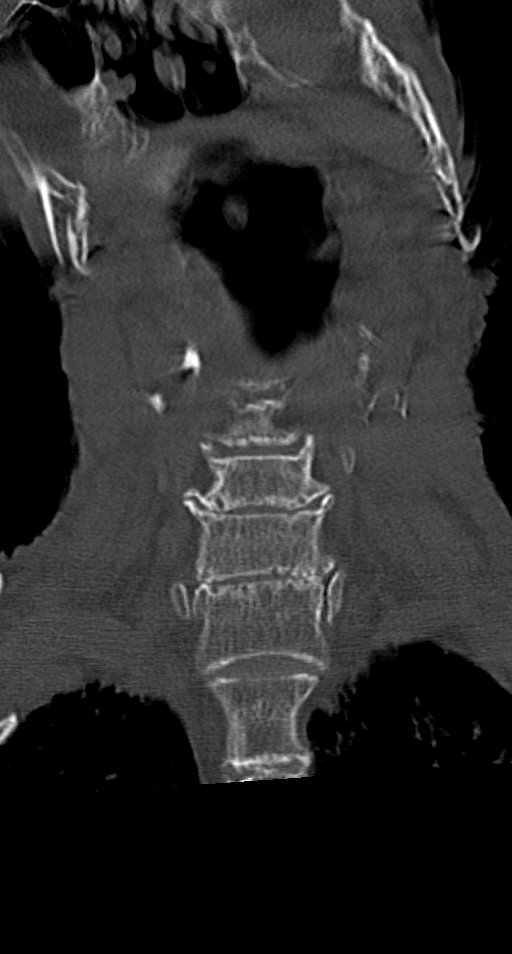
[im 43/85  bone]
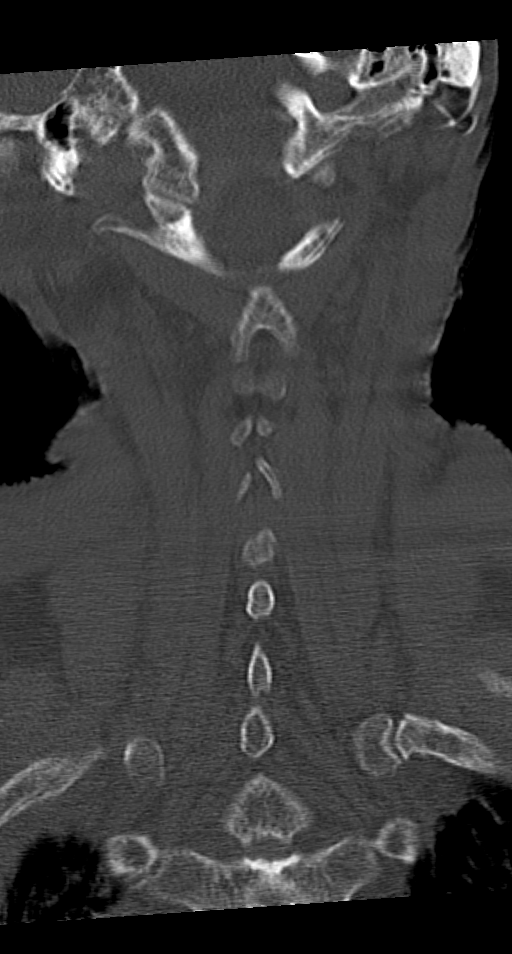
[im 65/85  bone]
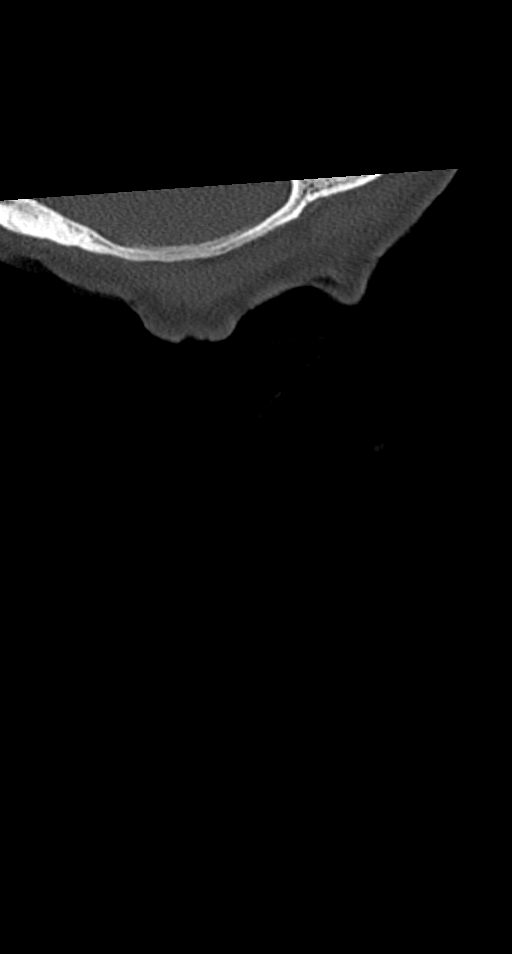

[Series 7: orthogonal bone · axial · 0.23mm/px · z∈[-323,-203]mm · 4 of 110 slices shown, 5 images]
[im 19/110  soft-tissue]
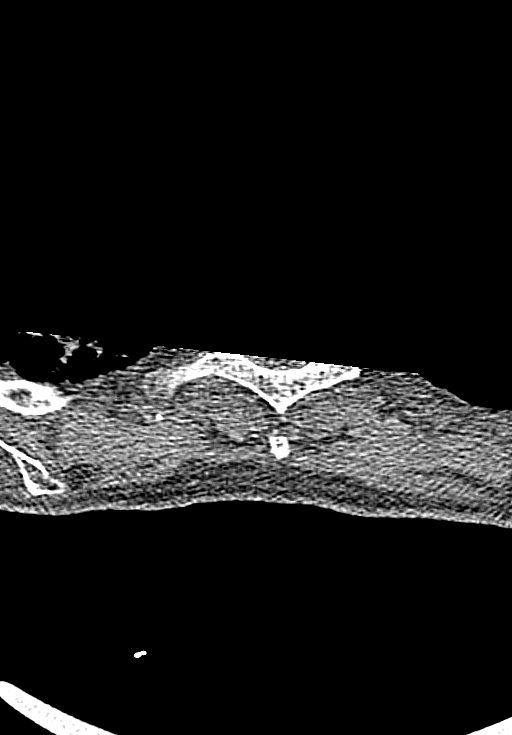
[im 19/110  bone]
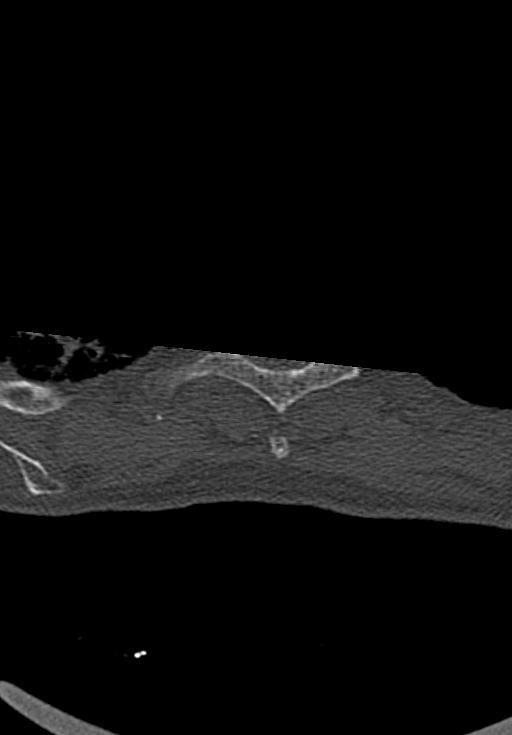
[im 37/110  bone]
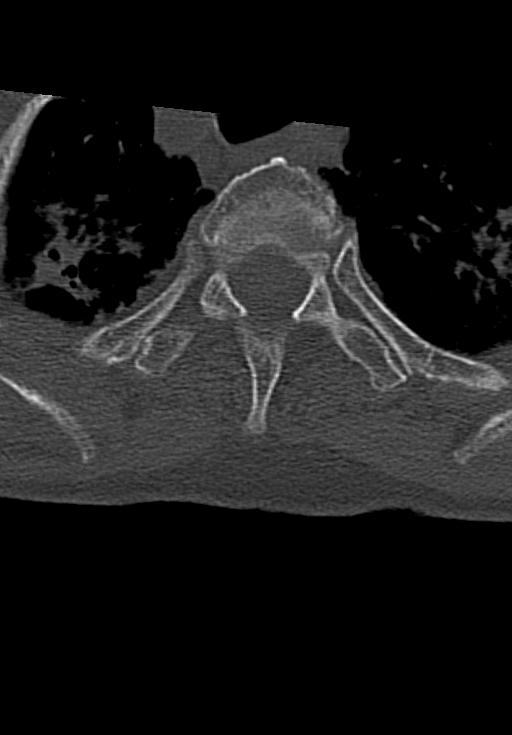
[im 73/110  bone]
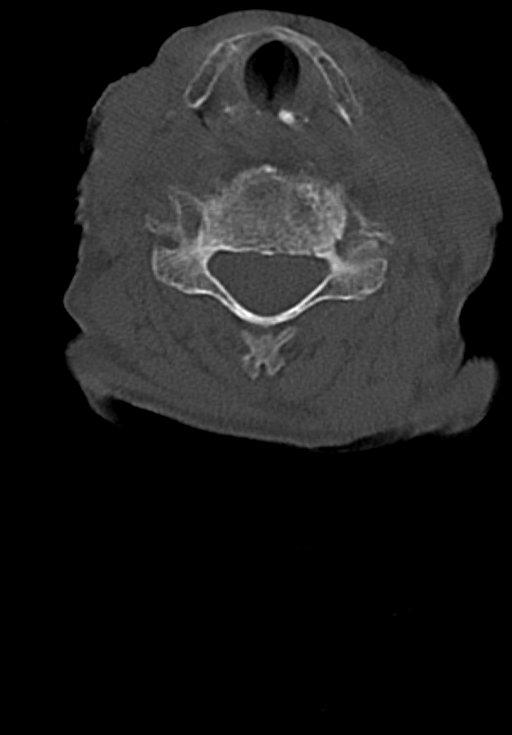
[im 91/110  bone]
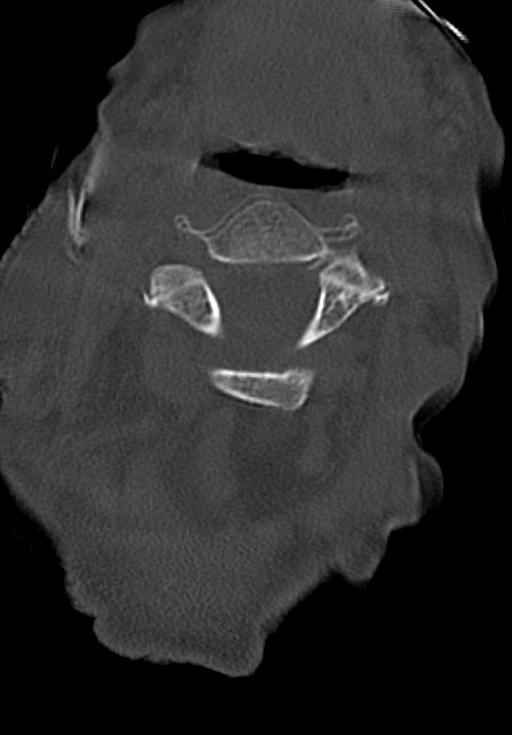

[Series 12: sagittal bone · sagittal · 0.16mm/px · 5 of 78 slices shown]
[im 13/78  bone]
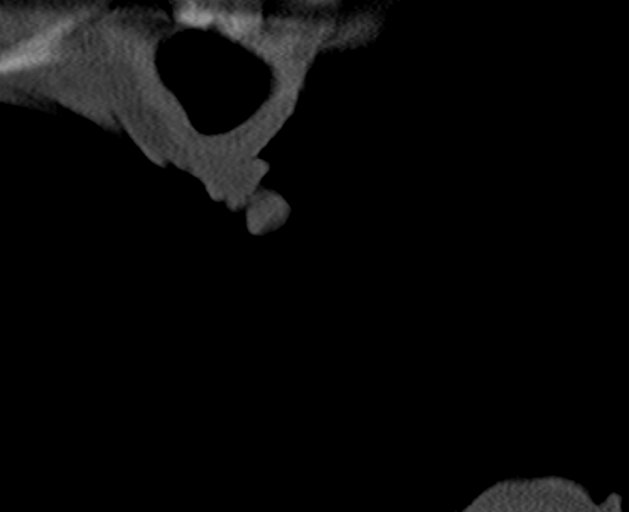
[im 26/78  bone]
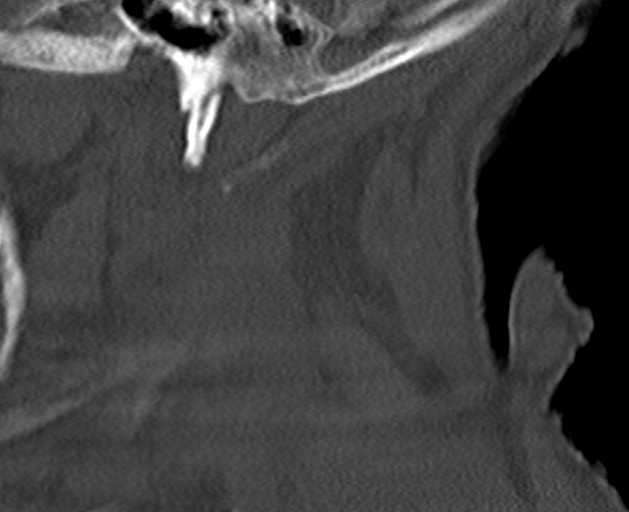
[im 39/78  bone]
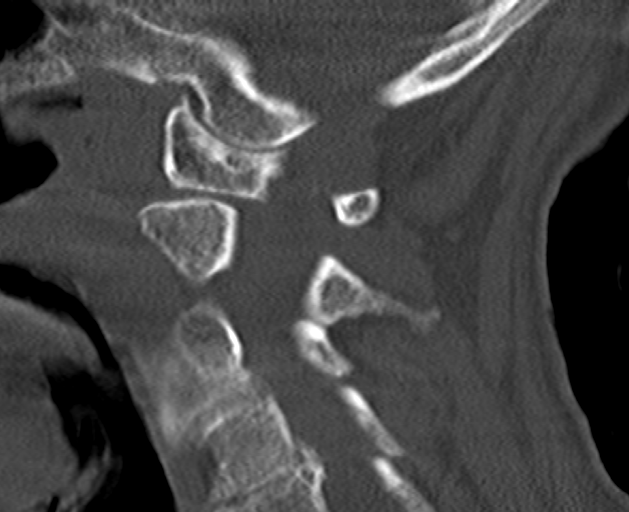
[im 52/78  bone]
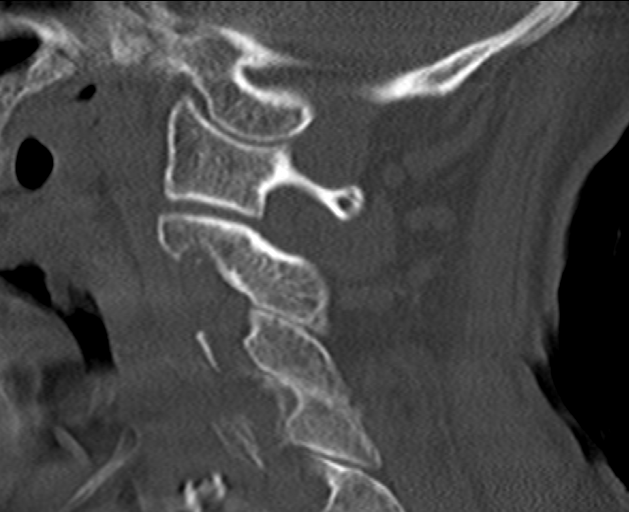
[im 65/78  bone]
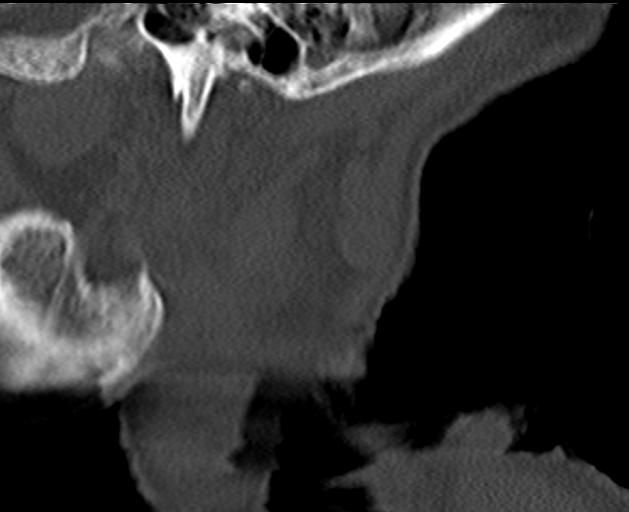

[12 of 33 positions shown; findings below may reference images not displayed]

FINDINGS: CT HEAD FINDINGS

Brain: Chronic white matter ischemic change. Generalized atrophy
which is likely age related. No evidence of acute infarction,
hemorrhage, hydrocephalus, extra-axial collection or mass
lesion/mass effect.

Vascular: No hyperdense vessel or unexpected calcification.

Skull: Normal. Negative for fracture or focal lesion.

Sinuses/Orbits: No acute finding.

Other: None.

CT CERVICAL SPINE FINDINGS

Alignment: Normal.

Skull base and vertebrae: Evaluation is limited due to motion
artifact. No definite fracture.

Soft tissues and spinal canal: No prevertebral fluid or swelling. No
visible canal hematoma.

Disc levels:  Multilevel moderate degenerative disc disease.

Upper chest: Bilateral bronchiectasis and reticulonodular opacities.

Other: None
IMPRESSION: 1. No acute intracranial abnormality.
2. No CT evidence of acute cervical spine injury.
3. Bronchiectasis and reticulonodular opacities seen at the
bilateral lung apices, possibly due to chronic atypical infection.
Findings could be further evaluated with dedicated chest CT.
